# Patient Record
Sex: Female | Born: 1940
Health system: Southern US, Community
[De-identification: ages and names within clinical notes are randomized; demographics above are authoritative.]

## PROBLEM LIST (undated history)

## (undated) DIAGNOSIS — G918 Other hydrocephalus: Secondary | ICD-10-CM

## (undated) DIAGNOSIS — I509 Heart failure, unspecified: Secondary | ICD-10-CM

---

## 1998-08-27 ENCOUNTER — Encounter: Payer: Self-pay | Admitting: Obstetrics and Gynecology

## 1998-08-27 ENCOUNTER — Ambulatory Visit (HOSPITAL_COMMUNITY): Admission: RE | Admit: 1998-08-27 | Discharge: 1998-08-27 | Payer: Self-pay | Admitting: Obstetrics and Gynecology

## 1999-02-11 ENCOUNTER — Ambulatory Visit (HOSPITAL_COMMUNITY): Admission: RE | Admit: 1999-02-11 | Discharge: 1999-02-11 | Payer: Self-pay | Admitting: Obstetrics and Gynecology

## 1999-02-11 ENCOUNTER — Encounter: Payer: Self-pay | Admitting: Obstetrics and Gynecology

## 1999-03-03 ENCOUNTER — Ambulatory Visit (HOSPITAL_COMMUNITY): Admission: RE | Admit: 1999-03-03 | Discharge: 1999-03-03 | Payer: Self-pay | Admitting: Obstetrics and Gynecology

## 2007-06-21 ENCOUNTER — Encounter: Admission: RE | Admit: 2007-06-21 | Discharge: 2007-06-21 | Payer: Self-pay | Admitting: Obstetrics and Gynecology

## 2009-04-28 ENCOUNTER — Encounter: Admission: RE | Admit: 2009-04-28 | Discharge: 2009-04-28 | Payer: Self-pay | Admitting: Internal Medicine

## 2009-07-14 ENCOUNTER — Encounter: Admission: RE | Admit: 2009-07-14 | Discharge: 2009-07-14 | Payer: Self-pay | Admitting: Internal Medicine

## 2009-11-24 ENCOUNTER — Other Ambulatory Visit: Admission: RE | Admit: 2009-11-24 | Discharge: 2009-11-24 | Payer: Self-pay | Admitting: Internal Medicine

## 2009-11-24 ENCOUNTER — Encounter: Admission: RE | Admit: 2009-11-24 | Discharge: 2009-11-24 | Payer: Self-pay | Admitting: Internal Medicine

## 2010-01-13 ENCOUNTER — Encounter: Admission: RE | Admit: 2010-01-13 | Discharge: 2010-01-13 | Payer: Self-pay | Admitting: Internal Medicine

## 2010-04-29 ENCOUNTER — Encounter: Admission: RE | Admit: 2010-04-29 | Discharge: 2010-04-29 | Payer: Self-pay | Admitting: Internal Medicine

## 2011-06-20 ENCOUNTER — Other Ambulatory Visit: Payer: Self-pay | Admitting: Internal Medicine

## 2011-06-20 DIAGNOSIS — Z1231 Encounter for screening mammogram for malignant neoplasm of breast: Secondary | ICD-10-CM

## 2011-06-29 ENCOUNTER — Ambulatory Visit
Admission: RE | Admit: 2011-06-29 | Discharge: 2011-06-29 | Disposition: A | Discharge status: Home / Self Care | Payer: Medicare Other | Source: Ambulatory Visit | Attending: Internal Medicine | Admitting: Internal Medicine

## 2011-06-29 DIAGNOSIS — Z1231 Encounter for screening mammogram for malignant neoplasm of breast: Secondary | ICD-10-CM

## 2011-10-03 ENCOUNTER — Other Ambulatory Visit: Payer: Self-pay

## 2011-10-03 ENCOUNTER — Encounter (HOSPITAL_BASED_OUTPATIENT_CLINIC_OR_DEPARTMENT_OTHER): Payer: Self-pay | Admitting: *Deleted

## 2011-10-03 ENCOUNTER — Emergency Department (INDEPENDENT_AMBULATORY_CARE_PROVIDER_SITE_OTHER): Payer: Medicare Other

## 2011-10-03 ENCOUNTER — Emergency Department (HOSPITAL_BASED_OUTPATIENT_CLINIC_OR_DEPARTMENT_OTHER)
Admission: EM | Admit: 2011-10-03 | Discharge: 2011-10-03 | Disposition: A | Discharge status: Home / Self Care | Payer: Medicare Other | Attending: Emergency Medicine | Admitting: Emergency Medicine

## 2011-10-03 DIAGNOSIS — R42 Dizziness and giddiness: Secondary | ICD-10-CM | POA: Insufficient documentation

## 2011-10-03 DIAGNOSIS — R197 Diarrhea, unspecified: Secondary | ICD-10-CM

## 2011-10-03 DIAGNOSIS — R51 Headache: Secondary | ICD-10-CM

## 2011-10-03 DIAGNOSIS — R5381 Other malaise: Secondary | ICD-10-CM | POA: Insufficient documentation

## 2011-10-03 DIAGNOSIS — J069 Acute upper respiratory infection, unspecified: Secondary | ICD-10-CM | POA: Insufficient documentation

## 2011-10-03 DIAGNOSIS — R5383 Other fatigue: Secondary | ICD-10-CM | POA: Insufficient documentation

## 2011-10-03 DIAGNOSIS — H538 Other visual disturbances: Secondary | ICD-10-CM | POA: Insufficient documentation

## 2011-10-03 LAB — CBC
HCT: 39.7 % (ref 36.0–46.0)
Hemoglobin: 13.5 g/dL (ref 12.0–15.0)
MCH: 30.3 pg (ref 26.0–34.0)
MCHC: 34 g/dL (ref 30.0–36.0)
MCV: 89.2 fL (ref 78.0–100.0)
Platelets: 120 10*3/uL — ABNORMAL LOW (ref 150–400)
RBC: 4.45 MIL/uL (ref 3.87–5.11)
RDW: 13.1 % (ref 11.5–15.5)
WBC: 4.1 10*3/uL (ref 4.0–10.5)

## 2011-10-03 LAB — COMPREHENSIVE METABOLIC PANEL
ALT: 15 U/L (ref 0–35)
AST: 21 U/L (ref 0–37)
Albumin: 4.1 g/dL (ref 3.5–5.2)
Alkaline Phosphatase: 90 U/L (ref 39–117)
BUN: 14 mg/dL (ref 6–23)
CO2: 26 mEq/L (ref 19–32)
Calcium: 9.6 mg/dL (ref 8.4–10.5)
Chloride: 102 mEq/L (ref 96–112)
Creatinine, Ser: 0.8 mg/dL (ref 0.50–1.10)
GFR calc Af Amer: 86 mL/min — ABNORMAL LOW (ref >90)
GFR calc non Af Amer: 74 mL/min — ABNORMAL LOW (ref >90)
Glucose, Bld: 113 mg/dL — ABNORMAL HIGH (ref 70–99)
Potassium: 3.9 mEq/L (ref 3.5–5.1)
Sodium: 138 mEq/L (ref 135–145)
Total Bilirubin: 0.5 mg/dL (ref 0.3–1.2)
Total Protein: 7 g/dL (ref 6.0–8.3)

## 2011-10-03 LAB — URINALYSIS, ROUTINE W REFLEX MICROSCOPIC
Bilirubin Urine: NEGATIVE
Glucose, UA: NEGATIVE mg/dL
Hgb urine dipstick: NEGATIVE
Ketones, ur: NEGATIVE mg/dL
Leukocytes, UA: NEGATIVE
Nitrite: NEGATIVE
Protein, ur: NEGATIVE mg/dL
Specific Gravity, Urine: 1.006 (ref 1.005–1.030)
Urobilinogen, UA: 0.2 mg/dL (ref 0.0–1.0)
pH: 7 (ref 5.0–8.0)

## 2011-10-03 LAB — DIFFERENTIAL
Basophils Absolute: 0 10*3/uL (ref 0.0–0.1)
Basophils Relative: 1 % (ref 0–1)
Eosinophils Absolute: 0 10*3/uL (ref 0.0–0.7)
Eosinophils Relative: 0 % (ref 0–5)
Lymphocytes Relative: 14 % (ref 12–46)
Lymphs Abs: 0.6 10*3/uL — ABNORMAL LOW (ref 0.7–4.0)
Monocytes Absolute: 0.2 10*3/uL (ref 0.1–1.0)
Monocytes Relative: 5 % (ref 3–12)
Neutro Abs: 3.2 10*3/uL (ref 1.7–7.7)
Neutrophils Relative %: 80 % — ABNORMAL HIGH (ref 43–77)

## 2011-10-03 MED ORDER — METOCLOPRAMIDE HCL 5 MG/ML IJ SOLN
10.0000 mg | Freq: Once | INTRAMUSCULAR | Status: AC
  Administered 2011-10-03: 10 mg via INTRAVENOUS
  Filled 2011-10-03: qty 2

## 2011-10-03 MED ORDER — DEXAMETHASONE SODIUM PHOSPHATE 10 MG/ML IJ SOLN
10.0000 mg | Freq: Once | INTRAMUSCULAR | Status: AC
  Administered 2011-10-03: 10 mg via INTRAVENOUS
  Filled 2011-10-03: qty 1

## 2011-10-03 MED ORDER — KETOROLAC TROMETHAMINE 30 MG/ML IJ SOLN
30.0000 mg | Freq: Once | INTRAMUSCULAR | Status: AC
  Administered 2011-10-03: 30 mg via INTRAVENOUS
  Filled 2011-10-03: qty 1

## 2011-10-03 MED ORDER — ONDANSETRON HCL 4 MG PO TABS: 4.0000 mg | ORAL_TABLET | Freq: Four times a day (QID) | ORAL | Status: AC

## 2011-10-03 MED ORDER — SODIUM CHLORIDE 0.9 % IV BOLUS (SEPSIS)
1000.0000 mL | Freq: Once | INTRAVENOUS | Status: AC
  Administered 2011-10-03: 1000 mL via INTRAVENOUS

## 2011-10-03 MED ORDER — DIPHENHYDRAMINE HCL 50 MG/ML IJ SOLN
25.0000 mg | Freq: Once | INTRAMUSCULAR | Status: AC
  Administered 2011-10-03: 25 mg via INTRAVENOUS
  Filled 2011-10-03: qty 1

## 2011-10-03 NOTE — ED Provider Notes (Signed)
History     CSN: [id number]  Arrival date & time 10/03/11  0806   First MD Initiated Contact with Patient 10/03/11 [phone]      Chief Complaint  Patient presents with  . Nasal Congestion  . Headache  . Blurred Vision    (Consider location/radiation/quality/duration/timing/severity/associated sxs/prior treatment) HPI Comments: Has some blurred vision at the onset of her headache which has since resolved. Has some lightheadedness upon standing. His had 5-6 episodes of nonbloody diarrhea. States she recently lost her sister to a stroke and has been under a lot of stress. Concerned she has similar symptoms.  Patient is a 71 y.o. female presenting with headaches. The history is provided by the patient. No language interpreter was used.  Headache  This is a new problem. The current episode started yesterday. The problem occurs constantly. The problem has been gradually worsening (began acutely but has gradually worsened since onset). Associated with: began while at computer. Pain location: diffuse but started in neck and occiput. The quality of the pain is described as dull. The pain is moderate. The pain does not radiate. Associated symptoms include nausea. Pertinent negatives include no anorexia, no fever, no malaise/fatigue, no chest pressure, no near-syncope, no palpitations, no shortness of breath and no vomiting. She has tried nothing for the symptoms.    History reviewed. No pertinent past medical history.  History reviewed. No pertinent past surgical history.  History reviewed. No pertinent family history.  History  Substance Use Topics  . Smoking status: Never Smoker   . Smokeless tobacco: Not on file  . Alcohol Use: No    OB History    Grav Para Term Preterm Abortions TAB SAB Ect Mult Living                  Review of Systems  Constitutional: Positive for fatigue. Negative for fever, chills, malaise/fatigue, activity change and appetite change.  HENT: Positive for  congestion and rhinorrhea. Negative for sore throat, neck pain and neck stiffness.   Eyes: Positive for photophobia and visual disturbance. Negative for pain and discharge.  Respiratory: Negative for cough and shortness of breath.   Cardiovascular: Negative for chest pain, palpitations and near-syncope.  Gastrointestinal: Positive for nausea and diarrhea. Negative for vomiting, abdominal pain, constipation and anorexia.  Genitourinary: Negative for dysuria, urgency, frequency and flank pain.  Musculoskeletal: Negative for myalgias, back pain, arthralgias and gait problem.  Neurological: Positive for light-headedness and headaches. Negative for dizziness, weakness and numbness.  All other systems reviewed and are negative.    Allergies  Codeine  Home Medications   Current Outpatient Rx  Name Route Sig Dispense Refill  . CALCIUM CARBONATE-VITAMIN D 500-200 MG-UNIT PO TABS Oral Take 1 tablet by mouth daily.      BP 146/73  Pulse 97  Temp(Src) 97.8 F (36.6 C) (Oral)  Resp 20  Ht 5\' 3"  (1.6 m)  Wt 138 lb (62.596 kg)  BMI 24.45 kg/m2  SpO2 97%  Physical Exam  Nursing note and vitals reviewed. Constitutional: She is oriented to person, place, and time. She appears well-developed and well-nourished. No distress.  HENT:  Head: Normocephalic and atraumatic.  Mouth/Throat: Oropharynx is clear and moist.  Eyes: Conjunctivae and EOM are normal. Pupils are equal, round, and reactive to light.  Neck: Normal range of motion. Neck supple.  Cardiovascular: Normal rate, regular rhythm, normal heart sounds and intact distal pulses.  Exam reveals no gallop and no friction rub.   No murmur heard. Pulmonary/Chest: Effort  normal and breath sounds normal. No respiratory distress.  Abdominal: Soft. Bowel sounds are normal. There is no tenderness.  Musculoskeletal: Normal range of motion. She exhibits no tenderness.  Neurological: She is alert and oriented to person, place, and time. She has  normal strength and normal reflexes. No cranial nerve deficit or sensory deficit. Coordination and gait normal.  Skin: Skin is warm and dry. No rash noted.    ED Course  Procedures (including critical care time)   Date: 10/03/2011  Rate: 80  Rhythm: normal sinus rhythm  QRS Axis: normal  Intervals: normal  ST/T Wave abnormalities: normal  Conduction Disutrbances:none  Narrative Interpretation:   Old EKG Reviewed: none available  Labs Reviewed  CBC - Abnormal; Notable for the following:    Platelets 120 (*)    All other components within normal limits  DIFFERENTIAL - Abnormal; Notable for the following:    Neutrophils Relative 80 (*)    Lymphs Abs 0.6 (*)    All other components within normal limits  COMPREHENSIVE METABOLIC PANEL - Abnormal; Notable for the following:    Glucose, Bld 113 (*)    GFR calc non Af Amer 74 (*)    GFR calc Af Amer 86 (*)    All other components within normal limits  URINALYSIS, ROUTINE W REFLEX MICROSCOPIC   Ct Head Wo Contrast  10/03/2011  *RADIOLOGY REPORT*  Clinical Data: Headache and dizziness.  CT HEAD WITHOUT CONTRAST  Technique:  Contiguous axial images were obtained from the base of the skull through the vertex without contrast.  Comparison: CT head from 11/24/2009  Findings: The brain stem, cerebellum, cerebral peduncles, thalami, basal ganglia, basilar cisterns, and ventricular system appear unremarkable.  No intracranial hemorrhage, mass lesion, or acute infarction is identified.  The visualized paranasal sinuses appear clear.  IMPRESSION:  1.  No significant abnormality identified.  Original Report Authenticated By: Dellia Cloud, M.D.     1. URI (upper respiratory infection)   2. Headache   3. Diarrhea       MDM  CT is negative. I have no concern about malignant cause of headache such as subarachnoid hemorrhage or meningitis. I have no concern for a stroke. I feel her symptoms are secondary to increased stress and bereavement  due to her recent loss. She received IV fluids given the 5 episodes of diarrhea with some improvement of her symptoms. She is not orthostatic. She has no nausea. She'll be discharged home with instructions to followup with her primary care physician later this week        Dayton Bailiff, MD 10/03/11 1044

## 2011-10-03 NOTE — ED Notes (Signed)
Pt to room 5 in w/c reporting ha x yesterday, sudden onset, "pressure", states she was at the computer yesterday at work and noticed ha at 3pm and some blurry vision, pt states she also feels dizzy, and that she recently lost her sister to a cva. Dr. Brooke Dare at bedside for eval.

## 2011-10-03 NOTE — ED Notes (Signed)
Pt up and to RR.  Pt states "I still dont feel just right."

## 2011-10-05 ENCOUNTER — Encounter (HOSPITAL_BASED_OUTPATIENT_CLINIC_OR_DEPARTMENT_OTHER): Payer: Self-pay | Admitting: *Deleted

## 2011-10-05 ENCOUNTER — Emergency Department (HOSPITAL_BASED_OUTPATIENT_CLINIC_OR_DEPARTMENT_OTHER)
Admission: EM | Admit: 2011-10-05 | Discharge: 2011-10-06 | Disposition: A | Discharge status: Home / Self Care | Payer: Medicare Other | Attending: Emergency Medicine | Admitting: Emergency Medicine

## 2011-10-05 DIAGNOSIS — F411 Generalized anxiety disorder: Secondary | ICD-10-CM | POA: Insufficient documentation

## 2011-10-05 DIAGNOSIS — R51 Headache: Secondary | ICD-10-CM | POA: Insufficient documentation

## 2011-10-05 DIAGNOSIS — F419 Anxiety disorder, unspecified: Secondary | ICD-10-CM

## 2011-10-05 NOTE — ED Notes (Signed)
Pt c/o h/a and HTN, seen here 22nd for same , pt did not get zofran filled

## 2011-10-06 ENCOUNTER — Encounter (HOSPITAL_BASED_OUTPATIENT_CLINIC_OR_DEPARTMENT_OTHER): Payer: Self-pay | Admitting: Emergency Medicine

## 2011-10-06 MED ORDER — LORAZEPAM 2 MG/ML IJ SOLN
1.0000 mg | Freq: Once | INTRAMUSCULAR | Status: AC
  Administered 2011-10-06: 1 mg via INTRAVENOUS
  Filled 2011-10-06: qty 1

## 2011-10-06 MED ORDER — DIPHENHYDRAMINE HCL 50 MG/ML IJ SOLN
25.0000 mg | Freq: Once | INTRAMUSCULAR | Status: AC
  Administered 2011-10-06: 25 mg via INTRAVENOUS
  Filled 2011-10-06: qty 1

## 2011-10-06 MED ORDER — SODIUM CHLORIDE 0.9 % IV SOLN
INTRAVENOUS | Status: DC
  Administered 2011-10-06: 01:00:00 via INTRAVENOUS

## 2011-10-06 MED ORDER — METOCLOPRAMIDE HCL 5 MG/ML IJ SOLN
10.0000 mg | Freq: Once | INTRAMUSCULAR | Status: AC
  Administered 2011-10-06: 10 mg via INTRAVENOUS
  Filled 2011-10-06: qty 2
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
## 2011-10-09 DIAGNOSIS — G43909 Migraine, unspecified, not intractable, without status migrainosus: Secondary | ICD-10-CM

## 2011-10-09 HISTORY — DX: Migraine, unspecified, not intractable, without status migrainosus: G43.909

## 2012-07-24 ENCOUNTER — Other Ambulatory Visit: Payer: Self-pay | Admitting: Family Medicine

## 2012-07-24 DIAGNOSIS — Z1231 Encounter for screening mammogram for malignant neoplasm of breast: Secondary | ICD-10-CM

## 2012-09-13 ENCOUNTER — Ambulatory Visit
Admission: RE | Admit: 2012-09-13 | Discharge: 2012-09-13 | Disposition: A | Discharge status: Home / Self Care | Payer: Medicare Other | Source: Ambulatory Visit | Attending: Family Medicine | Admitting: Family Medicine

## 2012-09-13 DIAGNOSIS — Z1231 Encounter for screening mammogram for malignant neoplasm of breast: Secondary | ICD-10-CM

## 2013-07-22 ENCOUNTER — Other Ambulatory Visit (HOSPITAL_COMMUNITY): Payer: Self-pay | Admitting: Family Medicine

## 2013-07-22 DIAGNOSIS — R109 Unspecified abdominal pain: Secondary | ICD-10-CM

## 2013-08-01 ENCOUNTER — Ambulatory Visit (HOSPITAL_COMMUNITY): Payer: Medicare Other

## 2013-08-08 ENCOUNTER — Encounter (HOSPITAL_COMMUNITY)
Admission: RE | Admit: 2013-08-08 | Discharge: 2013-08-08 | Disposition: A | Discharge status: Home / Self Care | Payer: Medicare Other | Source: Ambulatory Visit | Attending: Family Medicine | Admitting: Family Medicine

## 2013-08-08 DIAGNOSIS — R109 Unspecified abdominal pain: Secondary | ICD-10-CM

## 2013-08-08 MED ORDER — TECHNETIUM TC 99M MEBROFENIN IV KIT
5.0000 | PACK | Freq: Once | INTRAVENOUS | Status: AC | PRN
  Administered 2013-08-08: 5 via INTRAVENOUS

## 2013-08-08 MED ORDER — SINCALIDE 5 MCG IJ SOLR
INTRAMUSCULAR | Status: AC
  Filled 2013-08-08: qty 5

## 2013-08-08 MED ORDER — SINCALIDE 5 MCG IJ SOLR
0.0200 ug/kg | Freq: Once | INTRAMUSCULAR | Status: AC
  Administered 2013-08-08: 1.26 ug via INTRAVENOUS

## 2013-08-08 MED ORDER — STERILE WATER FOR INJECTION IJ SOLN
INTRAMUSCULAR | Status: AC
  Filled 2013-08-08: qty 10

## 2013-10-02 ENCOUNTER — Other Ambulatory Visit: Payer: Self-pay

## 2013-10-02 DIAGNOSIS — Z1231 Encounter for screening mammogram for malignant neoplasm of breast: Secondary | ICD-10-CM

## 2013-10-30 ENCOUNTER — Ambulatory Visit: Payer: Medicare Other

## 2013-11-20 ENCOUNTER — Ambulatory Visit
Admission: RE | Admit: 2013-11-20 | Discharge: 2013-11-20 | Disposition: A | Discharge status: Home / Self Care | Payer: Medicare Other | Source: Ambulatory Visit

## 2013-11-20 DIAGNOSIS — Z1231 Encounter for screening mammogram for malignant neoplasm of breast: Secondary | ICD-10-CM

## 2014-04-16 DIAGNOSIS — M810 Age-related osteoporosis without current pathological fracture: Secondary | ICD-10-CM | POA: Insufficient documentation

## 2014-04-16 HISTORY — DX: Age-related osteoporosis without current pathological fracture: M81.0

## 2014-08-14 ENCOUNTER — Other Ambulatory Visit: Payer: Self-pay | Admitting: Dermatology

## 2014-12-02 ENCOUNTER — Other Ambulatory Visit: Payer: Self-pay

## 2014-12-02 DIAGNOSIS — Z1231 Encounter for screening mammogram for malignant neoplasm of breast: Secondary | ICD-10-CM

## 2014-12-29 ENCOUNTER — Ambulatory Visit
Admission: RE | Admit: 2014-12-29 | Discharge: 2014-12-29 | Disposition: A | Discharge status: Home / Self Care | Payer: Medicare Other | Source: Ambulatory Visit

## 2014-12-29 DIAGNOSIS — Z1231 Encounter for screening mammogram for malignant neoplasm of breast: Secondary | ICD-10-CM

## 2016-02-19 ENCOUNTER — Other Ambulatory Visit: Payer: Self-pay | Admitting: Family Medicine

## 2016-02-19 ENCOUNTER — Other Ambulatory Visit: Payer: Self-pay | Admitting: Nurse Practitioner

## 2016-02-19 DIAGNOSIS — Z1231 Encounter for screening mammogram for malignant neoplasm of breast: Secondary | ICD-10-CM

## 2016-02-19 DIAGNOSIS — E2839 Other primary ovarian failure: Secondary | ICD-10-CM

## 2016-03-21 ENCOUNTER — Ambulatory Visit
Admission: RE | Admit: 2016-03-21 | Discharge: 2016-03-21 | Disposition: A | Discharge status: Home / Self Care | Payer: Medicare Other | Source: Ambulatory Visit | Attending: Family Medicine | Admitting: Family Medicine

## 2016-03-21 DIAGNOSIS — Z1231 Encounter for screening mammogram for malignant neoplasm of breast: Secondary | ICD-10-CM

## 2016-08-01 ENCOUNTER — Other Ambulatory Visit: Payer: Self-pay | Admitting: Orthopaedic Surgery

## 2016-08-01 DIAGNOSIS — M4726 Other spondylosis with radiculopathy, lumbar region: Secondary | ICD-10-CM

## 2016-08-16 ENCOUNTER — Ambulatory Visit
Admission: RE | Admit: 2016-08-16 | Discharge: 2016-08-16 | Disposition: A | Discharge status: Home / Self Care | Payer: Medicare Other | Source: Ambulatory Visit | Attending: Orthopaedic Surgery | Admitting: Orthopaedic Surgery

## 2016-08-16 DIAGNOSIS — M4726 Other spondylosis with radiculopathy, lumbar region: Secondary | ICD-10-CM

## 2016-10-23 DIAGNOSIS — I951 Orthostatic hypotension: Secondary | ICD-10-CM

## 2016-10-23 HISTORY — DX: Orthostatic hypotension: I95.1

## 2017-03-19 ENCOUNTER — Other Ambulatory Visit: Payer: Self-pay | Admitting: Family Medicine

## 2017-03-19 DIAGNOSIS — Z1231 Encounter for screening mammogram for malignant neoplasm of breast: Secondary | ICD-10-CM

## 2017-04-02 ENCOUNTER — Ambulatory Visit
Admission: RE | Admit: 2017-04-02 | Discharge: 2017-04-02 | Disposition: A | Discharge status: Home / Self Care | Payer: Medicare Other | Source: Ambulatory Visit | Attending: Family Medicine | Admitting: Family Medicine

## 2017-04-02 DIAGNOSIS — Z1231 Encounter for screening mammogram for malignant neoplasm of breast: Secondary | ICD-10-CM

## 2017-07-11 DIAGNOSIS — M19041 Primary osteoarthritis, right hand: Secondary | ICD-10-CM | POA: Insufficient documentation

## 2017-07-11 HISTORY — DX: Primary osteoarthritis, right hand: M19.041

## 2018-02-18 DIAGNOSIS — M47816 Spondylosis without myelopathy or radiculopathy, lumbar region: Secondary | ICD-10-CM

## 2018-02-18 DIAGNOSIS — M51369 Other intervertebral disc degeneration, lumbar region without mention of lumbar back pain or lower extremity pain: Secondary | ICD-10-CM

## 2018-02-18 DIAGNOSIS — M5136 Other intervertebral disc degeneration, lumbar region: Secondary | ICD-10-CM

## 2018-02-18 HISTORY — DX: Other intervertebral disc degeneration, lumbar region: M51.36

## 2018-02-18 HISTORY — DX: Spondylosis without myelopathy or radiculopathy, lumbar region: M47.816

## 2018-02-18 HISTORY — DX: Other intervertebral disc degeneration, lumbar region without mention of lumbar back pain or lower extremity pain: M51.369

## 2018-03-13 ENCOUNTER — Other Ambulatory Visit: Payer: Self-pay | Admitting: Family Medicine

## 2018-03-13 DIAGNOSIS — Z1231 Encounter for screening mammogram for malignant neoplasm of breast: Secondary | ICD-10-CM

## 2018-04-09 ENCOUNTER — Ambulatory Visit
Admission: RE | Admit: 2018-04-09 | Discharge: 2018-04-09 | Disposition: A | Discharge status: Home / Self Care | Payer: Medicare Other | Source: Ambulatory Visit | Attending: Family Medicine | Admitting: Family Medicine

## 2018-04-09 DIAGNOSIS — Z1231 Encounter for screening mammogram for malignant neoplasm of breast: Secondary | ICD-10-CM

## 2019-02-28 ENCOUNTER — Other Ambulatory Visit: Payer: Self-pay | Admitting: Family Medicine

## 2019-02-28 DIAGNOSIS — Z1231 Encounter for screening mammogram for malignant neoplasm of breast: Secondary | ICD-10-CM

## 2019-04-14 ENCOUNTER — Ambulatory Visit
Admission: RE | Admit: 2019-04-14 | Discharge: 2019-04-14 | Disposition: A | Discharge status: Home / Self Care | Payer: Medicare Other | Source: Ambulatory Visit | Attending: Family Medicine | Admitting: Family Medicine

## 2019-04-14 ENCOUNTER — Other Ambulatory Visit: Payer: Self-pay

## 2019-04-14 DIAGNOSIS — Z1231 Encounter for screening mammogram for malignant neoplasm of breast: Secondary | ICD-10-CM

## 2019-04-16 ENCOUNTER — Other Ambulatory Visit: Payer: Self-pay | Admitting: Family Medicine

## 2019-04-16 DIAGNOSIS — R928 Other abnormal and inconclusive findings on diagnostic imaging of breast: Secondary | ICD-10-CM

## 2019-04-17 ENCOUNTER — Ambulatory Visit: Payer: Medicare Other

## 2019-04-17 ENCOUNTER — Ambulatory Visit
Admission: RE | Admit: 2019-04-17 | Discharge: 2019-04-17 | Disposition: A | Discharge status: Home / Self Care | Payer: Medicare Other | Source: Ambulatory Visit | Attending: Family Medicine | Admitting: Family Medicine

## 2019-04-17 ENCOUNTER — Other Ambulatory Visit: Payer: Self-pay

## 2019-04-17 DIAGNOSIS — R928 Other abnormal and inconclusive findings on diagnostic imaging of breast: Secondary | ICD-10-CM

## 2019-10-09 ENCOUNTER — Ambulatory Visit: Payer: Medicare Other

## 2019-10-16 ENCOUNTER — Ambulatory Visit: Payer: Medicare Other | Attending: Internal Medicine

## 2019-10-16 DIAGNOSIS — Z23 Encounter for immunization: Secondary | ICD-10-CM | POA: Insufficient documentation

## 2019-10-16 NOTE — Progress Notes (Signed)
   Covid-19 Vaccination Clinic  Name:  Kristin Rush    MRN: [id number] DOB: [birth year]  10/16/2019  [name] was observed post Covid-19 immunization for 15 minutes without incidence. She was provided with Vaccine Information Sheet and instruction to access the V-Safe system.   [name] was instructed to call 911 with any severe reactions post vaccine: Marland Kitchen Difficulty breathing  . Swelling of your face and throat  . A fast heartbeat  . A bad rash all over your body  . Dizziness and weakness    Immunizations Administered    Name Date Dose VIS Date Route   Pfizer COVID-19 Vaccine 10/16/2019 11:07 AM 0.3 mL 08/22/2019 Intramuscular   Manufacturer: [employer], [employer]   Lot: [id number]   NDC: [address]-2

## 2019-11-10 ENCOUNTER — Ambulatory Visit: Payer: Medicare Other | Attending: Internal Medicine

## 2019-11-10 DIAGNOSIS — Z23 Encounter for immunization: Secondary | ICD-10-CM

## 2019-11-10 NOTE — Progress Notes (Signed)
   Covid-19 Vaccination Clinic  Name:  Kristin Rush    MRN: [id number] DOB: Dec 29, 1940  11/10/2019  Kristin Rush was observed post Covid-19 immunization for 15 minutes without incidence. She was provided with Vaccine Information Sheet and instruction to access the V-Safe system.   Kristin Rush was instructed to call 911 with any severe reactions post vaccine: Marland Kitchen Difficulty breathing  . Swelling of your face and throat  . A fast heartbeat  . A bad rash all over your body  . Dizziness and weakness    Immunizations Administered    Name Date Dose VIS Date Route   Pfizer COVID-19 Vaccine 11/10/2019  2:35 PM 0.3 mL 08/22/2019 Intramuscular   Manufacturer: [employer], [employer]   Lot: [id number]   NDC: [address]-4

## 2020-02-25 ENCOUNTER — Ambulatory Visit: Payer: Medicare Other | Admitting: Family Medicine

## 2020-03-16 ENCOUNTER — Other Ambulatory Visit: Payer: Self-pay

## 2020-03-16 ENCOUNTER — Inpatient Hospital Stay (HOSPITAL_COMMUNITY)
Admission: EM | Admit: 2020-03-16 | Discharge: 2020-03-23 | DRG: 312 | Disposition: A | Discharge status: Skilled Nursing Facility | Payer: Medicare Other | Attending: Internal Medicine | Admitting: Internal Medicine

## 2020-03-16 ENCOUNTER — Emergency Department (HOSPITAL_COMMUNITY): Payer: Medicare Other

## 2020-03-16 DIAGNOSIS — M19042 Primary osteoarthritis, left hand: Secondary | ICD-10-CM | POA: Diagnosis present

## 2020-03-16 DIAGNOSIS — R55 Syncope and collapse: Secondary | ICD-10-CM | POA: Diagnosis present

## 2020-03-16 DIAGNOSIS — Z79899 Other long term (current) drug therapy: Secondary | ICD-10-CM

## 2020-03-16 DIAGNOSIS — H903 Sensorineural hearing loss, bilateral: Secondary | ICD-10-CM | POA: Diagnosis present

## 2020-03-16 DIAGNOSIS — I110 Hypertensive heart disease with heart failure: Secondary | ICD-10-CM | POA: Diagnosis present

## 2020-03-16 DIAGNOSIS — Z882 Allergy status to sulfonamides status: Secondary | ICD-10-CM

## 2020-03-16 DIAGNOSIS — M5136 Other intervertebral disc degeneration, lumbar region: Secondary | ICD-10-CM | POA: Diagnosis present

## 2020-03-16 DIAGNOSIS — I10 Essential (primary) hypertension: Secondary | ICD-10-CM | POA: Diagnosis present

## 2020-03-16 DIAGNOSIS — Z20822 Contact with and (suspected) exposure to covid-19: Secondary | ICD-10-CM | POA: Diagnosis present

## 2020-03-16 DIAGNOSIS — H9201 Otalgia, right ear: Secondary | ICD-10-CM | POA: Diagnosis present

## 2020-03-16 DIAGNOSIS — R531 Weakness: Secondary | ICD-10-CM

## 2020-03-16 DIAGNOSIS — R42 Dizziness and giddiness: Secondary | ICD-10-CM

## 2020-03-16 DIAGNOSIS — Z885 Allergy status to narcotic agent status: Secondary | ICD-10-CM

## 2020-03-16 DIAGNOSIS — I951 Orthostatic hypotension: Secondary | ICD-10-CM | POA: Diagnosis not present

## 2020-03-16 DIAGNOSIS — M47816 Spondylosis without myelopathy or radiculopathy, lumbar region: Secondary | ICD-10-CM | POA: Diagnosis present

## 2020-03-16 DIAGNOSIS — M19041 Primary osteoarthritis, right hand: Secondary | ICD-10-CM | POA: Diagnosis present

## 2020-03-16 DIAGNOSIS — M810 Age-related osteoporosis without current pathological fracture: Secondary | ICD-10-CM | POA: Diagnosis present

## 2020-03-16 DIAGNOSIS — I5031 Acute diastolic (congestive) heart failure: Secondary | ICD-10-CM | POA: Diagnosis present

## 2020-03-16 DIAGNOSIS — Z7982 Long term (current) use of aspirin: Secondary | ICD-10-CM

## 2020-03-16 DIAGNOSIS — H6123 Impacted cerumen, bilateral: Secondary | ICD-10-CM | POA: Diagnosis present

## 2020-03-16 DIAGNOSIS — R296 Repeated falls: Secondary | ICD-10-CM

## 2020-03-16 LAB — COMPREHENSIVE METABOLIC PANEL
ALT: 17 U/L (ref 0–44)
AST: 24 U/L (ref 15–41)
Albumin: 3.7 g/dL (ref 3.5–5.0)
Alkaline Phosphatase: 73 U/L (ref 38–126)
Anion gap: 12 (ref 5–15)
BUN: 17 mg/dL (ref 8–23)
CO2: 20 mmol/L — ABNORMAL LOW (ref 22–32)
Calcium: 9 mg/dL (ref 8.9–10.3)
Chloride: 105 mmol/L (ref 98–111)
Creatinine, Ser: 0.97 mg/dL (ref 0.44–1.00)
GFR calc Af Amer: >60 mL/min (ref >60)
GFR calc non Af Amer: 56 mL/min — ABNORMAL LOW (ref >60)
Glucose, Bld: 84 mg/dL (ref 70–99)
Potassium: 4.4 mmol/L (ref 3.5–5.1)
Sodium: 137 mmol/L (ref 135–145)
Total Bilirubin: 0.6 mg/dL (ref 0.3–1.2)
Total Protein: 6.1 g/dL — ABNORMAL LOW (ref 6.5–8.1)

## 2020-03-16 LAB — CBC WITH DIFFERENTIAL/PLATELET
Abs Immature Granulocytes: 0.01 10*3/uL (ref 0.00–0.07)
Basophils Absolute: 0 10*3/uL (ref 0.0–0.1)
Basophils Relative: 1 %
Eosinophils Absolute: 0.1 10*3/uL (ref 0.0–0.5)
Eosinophils Relative: 1 %
HCT: 36.3 % (ref 36.0–46.0)
Hemoglobin: 11.5 g/dL — ABNORMAL LOW (ref 12.0–15.0)
Immature Granulocytes: 0 %
Lymphocytes Relative: 23 %
Lymphs Abs: 1.1 10*3/uL (ref 0.7–4.0)
MCH: 30.2 pg (ref 26.0–34.0)
MCHC: 31.7 g/dL (ref 30.0–36.0)
MCV: 95.3 fL (ref 80.0–100.0)
Monocytes Absolute: 0.5 10*3/uL (ref 0.1–1.0)
Monocytes Relative: 9 %
Neutro Abs: 3.3 10*3/uL (ref 1.7–7.7)
Neutrophils Relative %: 66 %
Platelets: 123 10*3/uL — ABNORMAL LOW (ref 150–400)
RBC: 3.81 MIL/uL — ABNORMAL LOW (ref 3.87–5.11)
RDW: 12.6 % (ref 11.5–15.5)
WBC: 5 10*3/uL (ref 4.0–10.5)
nRBC: 0 % (ref 0.0–0.2)

## 2020-03-16 LAB — CBG MONITORING, ED: Glucose-Capillary: 85 mg/dL (ref 70–99)

## 2020-03-16 MED ORDER — SODIUM CHLORIDE 0.9 % IV BOLUS
500.0000 mL | Freq: Once | INTRAVENOUS | Status: AC
  Administered 2020-03-16: 500 mL via INTRAVENOUS

## 2020-03-16 NOTE — ED Provider Notes (Signed)
[facility] EMERGENCY DEPARTMENT Provider Note   CSN: [id number] Arrival date & time: 03/16/20  1954     History No chief complaint on file.   Kristin Rush is a 79 y.o. female.  Patient with history of hypertension currently on lisinopril, lumbar degenerative disc disease --presents the emergency department today with complaint of weakness.  Patient sister is at bedside who provides additional history.  For the past several years patient has had "spells" of leg weakness and dizziness, sometimes resulting in falling.  These have become more frequent recently, patient sister noted that she recently fell in the shower.  Earlier today the patient went to [facility] ED where she was evaluated for abdominal pain.  She had a CT scan which was reported to show enteritis.  Patient was discharged home.  (Currently outside records are unable to be viewed in Epic but patient has discharge paperwork showing that CT abdomen/pelvis and labs were ordered.)  Patient was transported home today by her husband.  When they got home she was unable to get out of the truck because her legs were weak.  When she did get out of the truck she fell to the ground beside the truck.  She needed several people to help her get up and into a golf cart.  She could not walk and therefore EMS was called and patient was transported to the emergency department.  Per EMS, she was orthostatic.  Patient denies decreased appetite, diarrhea or vomiting.  She does report left-sided abdominal pain that is mild.  Sister has reported instances of right-sided facial droop that is intermittent.  No chest pain or shortness of breath.  No headache, neck pain or confusion.  Patient otherwise denies signs of stroke including: slurred speech, aphasia, numbness in extremities.            Past Medical History:  Diagnosis Date  . Migraine     There are no problems to display for this patient.   No past surgical  history on file.   OB History   No obstetric history on file.     No family history on file.  Social History   Tobacco Use  . Smoking status: Never Smoker  Substance Use Topics  . Alcohol use: No  . Drug use: No    Home Medications Prior to Admission medications   Medication Sig Start Date End Date Taking? Authorizing Provider  acetaminophen (TYLENOL) 500 MG tablet Take 500 mg by mouth once as needed. For pain    [provider]  aspirin 81 MG tablet Take 160 mg by mouth once.    [provider]  calcium-vitamin D (OSCAL WITH D) 500-200 MG-UNIT per tablet Take 1 tablet by mouth daily.    [provider]  escitalopram (LEXAPRO) 10 MG tablet Take 10 mg by mouth daily.    [provider]  ibuprofen (ADVIL,MOTRIN) 200 MG tablet Take 600 mg by mouth once as needed. For pain    [provider]    Allergies    Codeine and Sulfa antibiotics  Review of Systems   Review of Systems  Constitutional: Negative for chills and fever.  HENT: Negative for rhinorrhea and sore throat.   Eyes: Negative for redness.  Respiratory: Negative for cough and shortness of breath.   Cardiovascular: Negative for chest pain.  Gastrointestinal: Positive for abdominal pain and nausea (Today only). Negative for diarrhea and vomiting.  Genitourinary: Negative for dysuria, frequency, hematuria and urgency.  Musculoskeletal: Negative for myalgias.  Skin: Negative for rash.  Neurological: Positive for facial asymmetry (Intermittent, not present currently) and weakness. Negative for headaches.    Physical Exam Updated Vital Signs BP 102/75 (BP Location: Right Arm)   Pulse 69   Temp 97.8 F (36.6 C) (Oral)   Resp 18   Ht 5\' 3"  (1.6 m)   Wt 61.2 kg   SpO2 97%   BMI 23.91 kg/m   Physical Exam Vitals and nursing note reviewed.  Constitutional:      Appearance: She is well-developed.  HENT:     Head: Normocephalic and atraumatic.     Right Ear: Tympanic  membrane, ear canal and external ear normal.     Left Ear: Tympanic membrane, ear canal and external ear normal.     Nose: Nose normal.     Mouth/Throat:     Pharynx: Uvula midline.  Eyes:     General: Lids are normal.     Extraocular Movements:     Right eye: No nystagmus.     Left eye: No nystagmus.     Conjunctiva/sclera: Conjunctivae normal.     Pupils: Pupils are equal, round, and reactive to light.  Cardiovascular:     Rate and Rhythm: Normal rate and regular rhythm.  Pulmonary:     Effort: Pulmonary effort is normal.     Breath sounds: Normal breath sounds.  Abdominal:     Palpations: Abdomen is soft.     Tenderness: There is abdominal tenderness (Mild, left upper quadrant, no rebound or guarding). There is no guarding or rebound.  Musculoskeletal:     Cervical back: Normal range of motion and neck supple. No tenderness or bony tenderness.     Right lower leg: No edema.     Left lower leg: No edema.  Skin:    General: Skin is warm and dry.  Neurological:     Mental Status: She is alert and oriented to person, place, and time.     GCS: GCS eye subscore is 4. GCS verbal subscore is 5. GCS motor subscore is 6.     Cranial Nerves: No cranial nerve deficit.     Sensory: No sensory deficit.     Coordination: Coordination normal.     Gait: Gait normal.     Deep Tendon Reflexes: Reflexes are normal and symmetric.     Comments: Patient with 5 out of 5 strength in the entirety of the upper and lower extremities.  Normal sensation distally.  Cranial nerves are grossly intact.  Psychiatric:        Mood and Affect: Mood normal.     ED Results / Procedures / Treatments   Labs (all labs ordered are listed, but only abnormal results are displayed) Labs Reviewed  CBC WITH DIFFERENTIAL/PLATELET - Abnormal; Notable for the following components:      Result Value   RBC 3.81 (*)    Hemoglobin 11.5 (*)    Platelets 123 (*)    All other components within normal limits  COMPREHENSIVE  METABOLIC PANEL - Abnormal; Notable for the following components:   CO2 20 (*)    Total Protein 6.1 (*)    GFR calc non Af Amer 56 (*)    All other components within normal limits  SARS CORONAVIRUS 2 BY RT PCR (HOSPITAL ORDER, PERFORMED IN [facility])  URINALYSIS, ROUTINE W REFLEX MICROSCOPIC  CBG MONITORING, ED    ED ECG REPORT   Date: 03/16/2020  Rate: 75  Rhythm: normal sinus rhythm  QRS Axis: normal  Intervals: normal  ST/T Wave abnormalities: normal  Conduction Disutrbances:none  Narrative Interpretation:   Old EKG Reviewed: unchanged  I have personally reviewed the EKG tracing and agree with the computerized printout as noted.   Radiology CT Head Wo Contrast  Result Date: 03/16/2020 CLINICAL DATA:  Ataxia, stroke suspected falls, bilateral leg weakness EXAM: CT HEAD WITHOUT CONTRAST TECHNIQUE: Contiguous axial images were obtained from the base of the skull through the vertex without intravenous contrast. COMPARISON:  Most recent head CT 10/03/2011 FINDINGS: Brain: No hemorrhage or evidence of acute ischemia. There is a remote lacunar infarct in the right basal ganglia. Mild generalized atrophy and chronic small vessel ischemia. Dilatation of the lateral ventricles is greater than the degree of sulcal prominence. No extra-axial or subdural collection. Punctate left basal ganglia calcification is likely senescent. Vascular: Atherosclerosis of skullbase vasculature without hyperdense vessel or abnormal calcification. Skull: No fracture. Small midline frontal skull osteoma. No suspicious lesion. Sinuses/Orbits: Paranasal sinuses and mastoid air cells are clear. The visualized orbits are unremarkable. Other: None. IMPRESSION: 1. No acute intracranial abnormality. 2. Dilatation of the lateral ventricles is greater than the degree of sulcal prominence, can be seen with normal pressure hydrocephalus in the appropriate clinical setting. 3. Mild chronic small vessel ischemia.  Remote lacunar infarct in the right basal ganglia. Electronically Signed   By: Narda Rutherford M.D.   On: 03/16/2020 21:55    Procedures Procedures (including critical care time)  Medications Ordered in ED Medications  0.9 %  sodium chloride infusion (has no administration in time range)  sodium chloride 0.9 % bolus 500 mL (500 mLs Intravenous New Bag/Given 03/16/20 2254)    ED Course  I have reviewed the triage vital signs and the nursing notes.  Pertinent labs & imaging results that were available during my care of the patient were reviewed by me and considered in my medical decision making (see chart for details).  Patient seen and examined. Work-up initiated. Orthostatics ordered. Unable to see notes from [facility] at this time.   Vital signs reviewed and are as follows: BP 102/75 (BP Location: Right Arm)   Pulse 69   Temp 97.8 F (36.6 C) (Oral)   Resp 18   Ht 5\' 3"  (1.6 m)   Wt 61.2 kg   SpO2 97%   BMI 23.91 kg/m   Orthostatic VS for the past 24 hrs:  BP- Lying Pulse- Lying BP- Sitting Pulse- Sitting BP- Standing at 0 minutes Pulse- Standing at 0 minutes  03/16/20 2158 147/54 72 101/60 82 (!) 69/49 89   11:23 PM patient resting comfortably in bed.  She is continuing to receive IV fluids.  Lab work-up and head CT are unrevealing.  Discussed with patient that we recommend admission given her significant orthostasis.  She is in agreement.  Will discuss with hospitalist.  12:25 AM Spoke with Dr. [id number] who will see.     MDM Rules/Calculators/A&P                          Admit.   Final Clinical Impression(s) / ED Diagnoses Final diagnoses:  Orthostatic hypotension  Generalized weakness    Rx / DC Orders ED Discharge Orders    None       Leafy Half, PA-C 03/17/20 05/18/20    [id number], MD 03/17/20 [phone]

## 2020-03-16 NOTE — ED Triage Notes (Signed)
Pt coming from home after pt was transported home after visiting [facility] ER (due to weakness), and pt passed out outside of house. Family stated that they saw pt had a facial droop, but family could not note which side. No facial droop or any other stroke symptoms noted by EMS. Pt was diagnosed with enteritis @ [facility] but said they did not address any of her other "gait or speech issues". Per pt and pt's family, pt has progressively developed abnormal gait and asphasia over the past few months. Unknown LKN. Was initially hypotensive upon EMS arrival to pt's home @ 90/40. Performed orthostatic vitals and pt decreased from 115/70 to 70/40 with EMS.

## 2020-03-16 NOTE — ED Notes (Signed)
When doing orhtostatics patient felt fine until going from sitting to standing. Blood pressure dropped to 69/49 and patient states she started to feel dizzy. RN notified.

## 2020-03-17 ENCOUNTER — Encounter (HOSPITAL_COMMUNITY): Payer: Self-pay | Admitting: Internal Medicine

## 2020-03-17 ENCOUNTER — Inpatient Hospital Stay (HOSPITAL_COMMUNITY): Payer: Medicare Other

## 2020-03-17 DIAGNOSIS — M5136 Other intervertebral disc degeneration, lumbar region: Secondary | ICD-10-CM | POA: Diagnosis present

## 2020-03-17 DIAGNOSIS — M810 Age-related osteoporosis without current pathological fracture: Secondary | ICD-10-CM | POA: Diagnosis present

## 2020-03-17 DIAGNOSIS — H6123 Impacted cerumen, bilateral: Secondary | ICD-10-CM | POA: Diagnosis present

## 2020-03-17 DIAGNOSIS — Z20822 Contact with and (suspected) exposure to covid-19: Secondary | ICD-10-CM | POA: Diagnosis present

## 2020-03-17 DIAGNOSIS — H9201 Otalgia, right ear: Secondary | ICD-10-CM | POA: Diagnosis present

## 2020-03-17 DIAGNOSIS — R296 Repeated falls: Secondary | ICD-10-CM | POA: Diagnosis present

## 2020-03-17 DIAGNOSIS — I10 Essential (primary) hypertension: Secondary | ICD-10-CM | POA: Diagnosis not present

## 2020-03-17 DIAGNOSIS — I5031 Acute diastolic (congestive) heart failure: Secondary | ICD-10-CM | POA: Diagnosis present

## 2020-03-17 DIAGNOSIS — M19042 Primary osteoarthritis, left hand: Secondary | ICD-10-CM | POA: Diagnosis present

## 2020-03-17 DIAGNOSIS — Z79899 Other long term (current) drug therapy: Secondary | ICD-10-CM | POA: Diagnosis not present

## 2020-03-17 DIAGNOSIS — R55 Syncope and collapse: Secondary | ICD-10-CM

## 2020-03-17 DIAGNOSIS — I951 Orthostatic hypotension: Secondary | ICD-10-CM | POA: Diagnosis present

## 2020-03-17 DIAGNOSIS — I110 Hypertensive heart disease with heart failure: Secondary | ICD-10-CM | POA: Diagnosis present

## 2020-03-17 DIAGNOSIS — M47816 Spondylosis without myelopathy or radiculopathy, lumbar region: Secondary | ICD-10-CM | POA: Diagnosis present

## 2020-03-17 DIAGNOSIS — R42 Dizziness and giddiness: Secondary | ICD-10-CM

## 2020-03-17 DIAGNOSIS — H903 Sensorineural hearing loss, bilateral: Secondary | ICD-10-CM | POA: Diagnosis present

## 2020-03-17 DIAGNOSIS — Z885 Allergy status to narcotic agent status: Secondary | ICD-10-CM | POA: Diagnosis not present

## 2020-03-17 DIAGNOSIS — Z7982 Long term (current) use of aspirin: Secondary | ICD-10-CM | POA: Diagnosis not present

## 2020-03-17 DIAGNOSIS — M19041 Primary osteoarthritis, right hand: Secondary | ICD-10-CM | POA: Diagnosis present

## 2020-03-17 DIAGNOSIS — Z882 Allergy status to sulfonamides status: Secondary | ICD-10-CM | POA: Diagnosis not present

## 2020-03-17 HISTORY — DX: Sensorineural hearing loss, bilateral: H90.3

## 2020-03-17 LAB — COMPREHENSIVE METABOLIC PANEL
ALT: 15 U/L (ref 0–44)
AST: 20 U/L (ref 15–41)
Albumin: 3.2 g/dL — ABNORMAL LOW (ref 3.5–5.0)
Alkaline Phosphatase: 60 U/L (ref 38–126)
Anion gap: 8 (ref 5–15)
BUN: 13 mg/dL (ref 8–23)
CO2: 24 mmol/L (ref 22–32)
Calcium: 8.4 mg/dL — ABNORMAL LOW (ref 8.9–10.3)
Chloride: 105 mmol/L (ref 98–111)
Creatinine, Ser: 1 mg/dL (ref 0.44–1.00)
GFR calc Af Amer: >60 mL/min (ref >60)
GFR calc non Af Amer: 54 mL/min — ABNORMAL LOW (ref >60)
Glucose, Bld: 89 mg/dL (ref 70–99)
Potassium: 4 mmol/L (ref 3.5–5.1)
Sodium: 137 mmol/L (ref 135–145)
Total Bilirubin: 0.8 mg/dL (ref 0.3–1.2)
Total Protein: 5.4 g/dL — ABNORMAL LOW (ref 6.5–8.1)

## 2020-03-17 LAB — URINALYSIS, ROUTINE W REFLEX MICROSCOPIC
Bacteria, UA: NONE SEEN
Bilirubin Urine: NEGATIVE
Glucose, UA: NEGATIVE mg/dL
Hgb urine dipstick: NEGATIVE
Ketones, ur: NEGATIVE mg/dL
Nitrite: NEGATIVE
Protein, ur: NEGATIVE mg/dL
Specific Gravity, Urine: 1.015 (ref 1.005–1.030)
pH: 6 (ref 5.0–8.0)

## 2020-03-17 LAB — CBC WITH DIFFERENTIAL/PLATELET
Abs Immature Granulocytes: 0.01 10*3/uL (ref 0.00–0.07)
Basophils Absolute: 0 10*3/uL (ref 0.0–0.1)
Basophils Relative: 1 %
Eosinophils Absolute: 0.1 10*3/uL (ref 0.0–0.5)
Eosinophils Relative: 2 %
HCT: 33.8 % — ABNORMAL LOW (ref 36.0–46.0)
Hemoglobin: 11.1 g/dL — ABNORMAL LOW (ref 12.0–15.0)
Immature Granulocytes: 0 %
Lymphocytes Relative: 25 %
Lymphs Abs: 1.1 10*3/uL (ref 0.7–4.0)
MCH: 31.2 pg (ref 26.0–34.0)
MCHC: 32.8 g/dL (ref 30.0–36.0)
MCV: 94.9 fL (ref 80.0–100.0)
Monocytes Absolute: 0.5 10*3/uL (ref 0.1–1.0)
Monocytes Relative: 11 %
Neutro Abs: 2.6 10*3/uL (ref 1.7–7.7)
Neutrophils Relative %: 61 %
Platelets: 113 10*3/uL — ABNORMAL LOW (ref 150–400)
RBC: 3.56 MIL/uL — ABNORMAL LOW (ref 3.87–5.11)
RDW: 12.7 % (ref 11.5–15.5)
WBC: 4.3 10*3/uL (ref 4.0–10.5)
nRBC: 0 % (ref 0.0–0.2)

## 2020-03-17 LAB — ECHOCARDIOGRAM COMPLETE
Height: 63 in
Weight: 2160 oz

## 2020-03-17 LAB — SARS CORONAVIRUS 2 BY RT PCR (HOSPITAL ORDER, PERFORMED IN ~~LOC~~ HOSPITAL LAB): SARS Coronavirus 2: NEGATIVE

## 2020-03-17 LAB — CORTISOL-AM, BLOOD: Cortisol - AM: 11.6 ug/dL (ref 6.7–22.6)

## 2020-03-17 LAB — TROPONIN I (HIGH SENSITIVITY): Troponin I (High Sensitivity): 21 ng/L — ABNORMAL HIGH (ref <18)

## 2020-03-17 LAB — TSH: TSH: 2.896 u[IU]/mL (ref 0.350–4.500)

## 2020-03-17 LAB — MAGNESIUM: Magnesium: 2.2 mg/dL (ref 1.7–2.4)

## 2020-03-17 MED ORDER — ONDANSETRON HCL 4 MG PO TABS
4.0000 mg | ORAL_TABLET | Freq: Four times a day (QID) | ORAL | Status: DC | PRN
  Administered 2020-03-19: 4 mg via ORAL
  Filled 2020-03-17: qty 1

## 2020-03-17 MED ORDER — POLYETHYLENE GLYCOL 3350 17 G PO PACK: 17.0000 g | PACK | Freq: Every day | ORAL | Status: DC | PRN

## 2020-03-17 MED ORDER — MIDODRINE HCL 5 MG PO TABS
5.0000 mg | ORAL_TABLET | Freq: Three times a day (TID) | ORAL | Status: DC
  Administered 2020-03-17 – 2020-03-18 (×4): 5 mg via ORAL
  Filled 2020-03-17 (×4): qty 1

## 2020-03-17 MED ORDER — SODIUM CHLORIDE 0.9 % IV SOLN: INTRAVENOUS | Status: DC

## 2020-03-17 MED ORDER — SODIUM CHLORIDE 0.9 % IV SOLN: INTRAVENOUS | Status: AC

## 2020-03-17 MED ORDER — CARBAMIDE PEROXIDE 6.5 % OT SOLN
5.0000 [drp] | Freq: Two times a day (BID) | OTIC | Status: AC
  Administered 2020-03-17 – 2020-03-20 (×7): 5 [drp] via OTIC
  Filled 2020-03-17 (×2): qty 15

## 2020-03-17 MED ORDER — ACETAMINOPHEN 650 MG RE SUPP: 650.0000 mg | Freq: Four times a day (QID) | RECTAL | Status: DC | PRN

## 2020-03-17 MED ORDER — ESCITALOPRAM OXALATE 10 MG PO TABS
10.0000 mg | ORAL_TABLET | Freq: Every day | ORAL | Status: DC
  Administered 2020-03-17 – 2020-03-23 (×7): 10 mg via ORAL
  Filled 2020-03-17 (×7): qty 1

## 2020-03-17 MED ORDER — ACETAMINOPHEN 325 MG PO TABS
650.0000 mg | ORAL_TABLET | Freq: Four times a day (QID) | ORAL | Status: DC | PRN
  Administered 2020-03-17 – 2020-03-21 (×5): 650 mg via ORAL
  Filled 2020-03-17 (×5): qty 2

## 2020-03-17 MED ORDER — ENOXAPARIN SODIUM 40 MG/0.4ML ~~LOC~~ SOLN
40.0000 mg | Freq: Every day | SUBCUTANEOUS | Status: DC
  Administered 2020-03-17 – 2020-03-23 (×7): 40 mg via SUBCUTANEOUS
  Filled 2020-03-17 (×7): qty 0.4

## 2020-03-17 MED ORDER — ONDANSETRON HCL 4 MG/2ML IJ SOLN: 4.0000 mg | Freq: Four times a day (QID) | INTRAMUSCULAR | Status: DC | PRN

## 2020-03-17 NOTE — Progress Notes (Signed)
PROGRESS NOTE    Kristin Rush  IOX:[id number] DOB: [birth year] DOA: 03/16/2020 PCP: Eartha Inch, MD     Brief Narrative:  79 year old WF PMHx HTN, orthostatic hypotension, chronic low back pain, lumbar spondylosis, disc degeneration lumbar, bilateral sensorineural hearing loss,  Presents to [facility] emergency department with complaints of postural lightheadedness and presyncope.  Patient is a somewhat poor historian and the majority the history is been obtained from combination of discussions with both patient and her sister who is at the bedside.  Patient explains that she has been suffering with intermittent bouts of postural lightheadedness for several years.  Approximately 1 month ago, patient was placed on lisinopril by her primary care provider for hypertension.  This was initially started at 5 mg daily.  Then, approximately 2 weeks ago patient's lisinopril was increased to 10 mg daily.  At this point, the patient began to experience a dramatic increase in frequency and severity of her episodes of lightheadedness.  This has resulted in frequent falls on a nearly daily basis.  Patient explains that nearly every time she attempts to rise from a seated position she experiences intense lightheadedness and feelings like she is going to blackout.  Symptoms seem to improve with sitting down.  Due to continued frequent bouts of intense lightheadedness and falls, patient presents to [facility] emergency department for evaluation.  Upon evaluation in the emergency department patient was found to have profound orthostatic hypotension with a blood pressure dropping from 147/54 all the way to 69/49.  Due to the emergency department provider is concerned as to the patient's safety in the home setting these dramatic drops in blood pressure, hospitalist group has been called to assess the patient for mission the hospital.   Subjective: A/O x4, negative nausea, negative  vomiting, negative abdominal pain, negative chest pain. States in the last 3 weeks has had increasing incidence of falling/syncope. Has had presyncope for 4 years has been worked up by Dr. Cyndia Bent PCP as well as a neurologist. Patient was recently started on lisinopril and HCTZ by PCP for HTN. Per sister they have a family history of strokes.   Assessment & Plan: Covid vaccination;   Principal Problem:   Postural dizziness with presyncope Active Problems:   Orthostatic hypotension   Frequent falls   Essential hypertension   Right ear pain   Excessive cerumen in both ear canals   Syncope and collapse -7/7 patient remains with orthostatic hypotension. -Holding lisinopril and HCTZ -7/7 midodrine 5 mg TID -A.m. cortisol pending   Orthostatic hypotension -see syncope and collapse  Right ear pain -Patient did not complain of ear pain today  Patient is complaining of a 1 day history of right ear pain  And attempted to evaluate the ears via otoscopic examination but unfortunately patient possesses a significant amount of cerumen  Debrox drops ordered, repeat otoscopic examination can be attempted later in the hospitalization or in the outpatient setting by the patient's primary care provider.  Even if the patient did indeed have a right ear infection this would not cause orthostatic hypotension or lightheadedness and I do not believe that this is related.  No antibiotics for now.    Excessive cerumen in both ear canals  See assessment and plan above  DVT prophylaxis:  Code Status: Full Family Communication: 7/7 family present at bedside for discussion of plan of care Status is: Inpatient    Dispo: The patient is from: Home  Anticipated d/c is to: Home vs SNF              Anticipated d/c date is: 7/9              Patient currently unstable      Consultants:    Procedures/Significant Events:    I have personally reviewed and interpreted all  radiology studies and my findings are as above.  VENTILATOR SETTINGS:    Cultures 7/6 SARS coronavirus negative  Antimicrobials:    Devices    LINES / TUBES:      Continuous Infusions: . sodium chloride 125 mL/hr at 03/17/20 0119     Objective: Vitals:   03/17/20 0400 03/17/20 0445 03/17/20 0710 03/17/20 0711  BP: (!) 127/58 (!) 151/60 137/69 103/60  Pulse: 63 69 83 78  Resp: 17 12 15 15   Temp:      TempSrc:      SpO2: 94% 94% 96% 98%  Weight:      Height:        Intake/Output Summary (Last 24 hours) at 03/17/2020 [id number] Last data filed at 03/17/2020 0440 Gross per 24 hour  Intake 700 ml  Output --  Net 700 ml   Filed Weights   03/16/20 2003  Weight: 61.2 kg    Examination:  General: A/O x4, No acute respiratory distress Eyes: negative scleral hemorrhage, negative anisocoria, negative icterus ENT: Negative Runny nose, negative gingival bleeding, Neck:  Negative scars, masses, torticollis, lymphadenopathy, JVD Lungs: Clear to auscultation bilaterally without wheezes or crackles Cardiovascular: Regular rate and rhythm without murmur gallop or rub normal S1 and S2 Abdomen: negative abdominal pain, nondistended, positive soft, bowel sounds, no rebound, no ascites, no appreciable mass Extremities: No significant cyanosis, clubbing, or edema bilateral lower extremities Skin: Negative rashes, lesions, ulcers Psychiatric:  Negative depression, negative anxiety, negative fatigue, negative mania  Central nervous system:  Cranial nerves II through XII intact, tongue/uvula midline, all extremities muscle strength 5/5, sensation intact throughout,  negative dysarthria, negative expressive aphasia, negative receptive aphasia.  .     Data Reviewed: Care during the described time interval was provided by me .  I have reviewed this patient's available data, including medical history, events of note, physical examination, and all test results as part of my  evaluation.  CBC: Recent Labs  Lab 03/16/20 2215 03/17/20 0444  WBC 5.0 4.3  NEUTROABS 3.3 2.6  HGB 11.5* 11.1*  HCT 36.3 33.8*  MCV 95.3 94.9  PLT 123* 113*   Basic Metabolic Panel: Recent Labs  Lab 03/16/20 2215 03/17/20 0444  NA 137 137  K 4.4 4.0  CL 105 105  CO2 20* 24  GLUCOSE 84 89  BUN 17 13  CREATININE 0.97 1.00  CALCIUM 9.0 8.4*  MG  --  2.2   GFR: Estimated Creatinine Clearance: 39 mL/min (by C-G formula based on SCr of 1 mg/dL). Liver Function Tests: Recent Labs  Lab 03/16/20 2215 03/17/20 0444  AST 24 20  ALT 17 15  ALKPHOS 73 60  BILITOT 0.6 0.8  PROT 6.1* 5.4*  ALBUMIN 3.7 3.2*   No results for input(s): LIPASE, AMYLASE in the last 168 hours. No results for input(s): AMMONIA in the last 168 hours. Coagulation Profile: No results for input(s): INR, PROTIME in the last 168 hours. Cardiac Enzymes: No results for input(s): CKTOTAL, CKMB, CKMBINDEX, TROPONINI in the last 168 hours. BNP (last 3 results) No results for input(s): PROBNP in the last 8760 hours. HbA1C: No results for input(s):  HGBA1C in the last 72 hours. CBG: Recent Labs  Lab 03/16/20 2008  GLUCAP 85   Lipid Profile: No results for input(s): CHOL, HDL, LDLCALC, TRIG, CHOLHDL, LDLDIRECT in the last 72 hours. Thyroid Function Tests: Recent Labs    03/17/20 0444  TSH 2.896   Anemia Panel: No results for input(s): VITAMINB12, FOLATE, FERRITIN, TIBC, IRON, RETICCTPCT in the last 72 hours. Sepsis Labs: No results for input(s): PROCALCITON, LATICACIDVEN in the last 168 hours.  Recent Results (from the past 240 hour(s))  SARS Coronavirus 2 by RT PCR (hospital order, performed in [facility] hospital lab) Nasopharyngeal Nasopharyngeal Swab     Status: None   Collection Time: 03/16/20 11:41 PM   Specimen: Nasopharyngeal Swab  Result Value Ref Range Status   SARS Coronavirus 2 NEGATIVE NEGATIVE Final    Comment: (NOTE) SARS-CoV-2 target nucleic acids are NOT DETECTED.  The  SARS-CoV-2 RNA is generally detectable in upper and lower respiratory specimens during the acute phase of infection. The lowest concentration of SARS-CoV-2 viral copies this assay can detect is 250 copies / mL. A negative result does not preclude SARS-CoV-2 infection and should not be used as the sole basis for treatment or other patient management decisions.  A negative result may occur with improper specimen collection / handling, submission of specimen other than nasopharyngeal swab, presence of viral mutation(s) within the areas targeted by this assay, and inadequate number of viral copies (<250 copies / mL). A negative result must be combined with clinical observations, patient history, and epidemiological information.  Fact Sheet for Patients:   BoilerBrush.com.cy  Fact Sheet for Healthcare Providers: https://pope.com/  This test is not yet approved or  cleared by the [place] FDA and has been authorized for detection and/or diagnosis of SARS-CoV-2 by FDA under an Emergency Use Authorization (EUA).  This EUA will remain in effect (meaning this test can be used) for the duration of the COVID-19 declaration under Section 564(b)(1) of the Act, 21 U.S.C. section 360bbb-3(b)(1), unless the authorization is terminated or revoked sooner.  Performed at [facility], [address], [place], [place] [address]          Radiology Studies: CT Head Wo Contrast  Result Date: 03/16/2020 CLINICAL DATA:  Ataxia, stroke suspected falls, bilateral leg weakness EXAM: CT HEAD WITHOUT CONTRAST TECHNIQUE: Contiguous axial images were obtained from the base of the skull through the vertex without intravenous contrast. COMPARISON:  Most recent head CT 10/03/2011 FINDINGS: Brain: No hemorrhage or evidence of acute ischemia. There is a remote lacunar infarct in the right basal ganglia. Mild generalized atrophy and chronic small vessel  ischemia. Dilatation of the lateral ventricles is greater than the degree of sulcal prominence. No extra-axial or subdural collection. Punctate left basal ganglia calcification is likely senescent. Vascular: Atherosclerosis of skullbase vasculature without hyperdense vessel or abnormal calcification. Skull: No fracture. Small midline frontal skull osteoma. No suspicious lesion. Sinuses/Orbits: Paranasal sinuses and mastoid air cells are clear. The visualized orbits are unremarkable. Other: None. IMPRESSION: 1. No acute intracranial abnormality. 2. Dilatation of the lateral ventricles is greater than the degree of sulcal prominence, can be seen with normal pressure hydrocephalus in the appropriate clinical setting. 3. Mild chronic small vessel ischemia. Remote lacunar infarct in the right basal ganglia. Electronically Signed   By: Narda Rutherford M.D.   On: 03/16/2020 21:55        Scheduled Meds: . carbamide peroxide  5 drop Both EARS BID  . enoxaparin (LOVENOX) injection  40 mg Subcutaneous Daily  . escitalopram  10 mg Oral Daily  . midodrine  5 mg Oral TID WC   Continuous Infusions: . sodium chloride 125 mL/hr at 03/17/20 0119     LOS: 0 days    Time spent:40 min    Aaryanna Hyden, Roselind Messier, MD [facility] Pager [phone]  If 7PM-7AM, please contact night-coverage [email or web address] Password [facility] 03/17/2020, 8:38 AM

## 2020-03-17 NOTE — ED Notes (Signed)
Pt given tylenol for back pain.

## 2020-03-17 NOTE — H&P (Signed)
History and Physical    Kristin RotaSue Rush Wiesen RUE:[id number] DOB: 08/16/1941 DOA: 03/16/2020  PCP: Eartha InchBadger, Michael C, MD  Patient coming from: Home   Chief Complaint: Presyncope, frequent falls   HPI:    79 year old female with past medical history of hypertension, orthostatic hypotension, chronic low back pain who presents to [facility] department with complaints of postural lightheadedness and presyncope.  Patient is a somewhat poor historian and the majority the history is been obtained from combination of discussions with both patient and her sister who is at the bedside.  Patient explains that she has been suffering with intermittent bouts of postural lightheadedness for several years.  Approximately 1 month ago, patient was placed on lisinopril by her primary care provider for hypertension.  This was initially started at 5 mg daily.  Then, approximately 2 weeks ago patient's lisinopril was increased to 10 mg daily.  At this point, the patient began to experience a dramatic increase in frequency and severity of her episodes of lightheadedness.  This has resulted in frequent falls on a nearly daily basis.  Patient explains that nearly every time she attempts to rise from a seated position she experiences intense lightheadedness and feelings like she is going to blackout.  Symptoms seem to improve with sitting down.  Due to continued frequent bouts of intense lightheadedness and falls, patient presents to [facility]  emergency department for evaluation.  Upon evaluation in the emergency department patient was found to have profound orthostatic hypotension with a blood pressure dropping from 147/54 all the way to 69/49.  Due to the emergency department provider is concerned as to the patient's safety in the home setting these dramatic drops in blood pressure, hospitalist group has been called to assess the patient for mission the hospital.   Review of Systems: A  10-system review of systems has been performed and all systems are negative with the exception of what is listed in the HPI.    Past Medical History:  Diagnosis Date  . 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 Lumbar spondylosis 02/18/2018  . Migraine   . Migraines 10/09/2011  . Orthostatic hypotension 10/23/2016  . Osteoporosis 04/16/2014  . Primary osteoarthritis of both hands 07/11/2017    No past surgical history on file.   reports that she has never smoked. She does not have any smokeless tobacco history on file. She reports that she does not drink alcohol and does not use drugs.  Allergies  Allergen Reactions  . Codeine Nausea And Vomiting  . Sulfa Antibiotics     No family history on file.   Prior to Admission medications   Medication Sig Start Date End Date Taking? Authorizing Provider  acetaminophen (TYLENOL) 500 MG tablet Take 500 mg by mouth daily as needed for mild pain.    Yes [provider]  calcium-vitamin D (OSCAL WITH D) 500-200 MG-UNIT per tablet Take 1 tablet by mouth daily.   Yes [provider]  cyanocobalamin 1000 MCG tablet Take 1,000 mcg by mouth daily. 04/19/18  Yes [provider]  escitalopram (LEXAPRO) 10 MG tablet Take 10 mg by mouth daily.   Yes [provider]  ibuprofen (ADVIL,MOTRIN) 200 MG tablet Take 600 mg by mouth daily as needed for moderate pain. For pain     Yes [provider]  lisinopril (ZESTRIL) 10 MG tablet Take 10 mg by mouth daily. 02/25/20  Yes [provider]  Multiple Vitamins-Minerals (PRESERVISION AREDS)  TABS Take 1 tablet by mouth daily.   Yes [provider]  dicyclomine (BENTYL) 20 MG tablet Take 20 mg by mouth 2 (two) times daily. 03/16/20   [provider]  ondansetron (ZOFRAN-ODT) 4 MG disintegrating tablet Take 4 mg by mouth every 8 (eight) hours as needed for nausea/vomiting. 03/16/20   [provider]     Physical Exam: Vitals:   03/16/20 2215 03/16/20 2230 03/17/20 0025 03/17/20 0100  BP: (!) 163/71 (!) 168/68  (!) 150/91  Pulse: 71 74 71 76  Resp: (!) 22 16 15 14   Temp:      TempSrc:      SpO2: 98% 98% 97% 97%  Weight:      Height:        Constitutional: Acute alert and oriented x3, no associated distress.   Skin: no rashes, no lesions, poor skin turgor noted. Eyes: Pupils are equally reactive to light.  No evidence of scleral icterus or conjunctival pallor.  ENMT: Significant cerumen noted in the bilateral ear canals limiting assessment of the bilateral tympanic membranes.  Moist mucous membranes noted.  Posterior pharynx clear of any exudate or lesions.   Neck: Notable tenderness of the right anterior cervical chain.  Normal, supple, no masses, no thyromegaly.  No evidence of jugular venous distension.   Respiratory: clear to auscultation bilaterally, no wheezing, no crackles. Normal respiratory effort. No accessory muscle use.  Cardiovascular: Regular rate and rhythm, no murmurs / rubs / gallops. No extremity edema. 2+ pedal pulses. No carotid bruits.  Chest:   Nontender without crepitus or deformity.   Back:   Nontender without crepitus or deformity. Abdomen: Abdomen is soft and nontender.  No evidence of intra-abdominal masses.  Positive bowel sounds noted in all quadrants.   Musculoskeletal: No joint deformity upper and lower extremities. Good ROM, no contractures. Normal muscle tone.  Neurologic: CN 2-12 grossly intact. Sensation intact, strength noted to be 5 out of 5 in all 4 extremities.  Patient is following all commands.  Patient is responsive to verbal stimuli.   Psychiatric: Patient presents as a normal mood with appropriate affect.  Patient seems to possess insight as to theircurrent situation.     Labs on Admission: I have personally reviewed following labs and imaging studies -   CBC: Recent Labs  Lab 03/16/20 2215  WBC 5.0  NEUTROABS 3.3  HGB 11.5*  HCT  36.3  MCV 95.3  PLT 123*   Basic Metabolic Panel: Recent Labs  Lab 03/16/20 2215  NA 137  K 4.4  CL 105  CO2 20*  GLUCOSE 84  BUN 17  CREATININE 0.97  CALCIUM 9.0   GFR: Estimated Creatinine Clearance: 40.2 mL/min (by C-G formula based on SCr of 0.97 mg/dL). Liver Function Tests: Recent Labs  Lab 03/16/20 2215  AST 24  ALT 17  ALKPHOS 73  BILITOT 0.6  PROT 6.1*  ALBUMIN 3.7   No results for input(s): LIPASE, AMYLASE in the last 168 hours. No results for input(s): AMMONIA in the last 168 hours. Coagulation Profile: No results for input(s): INR, PROTIME in the last 168 hours. Cardiac Enzymes: No results for input(s): CKTOTAL, CKMB, CKMBINDEX, TROPONINI in the last 168 hours. BNP (last 3 results) No results for input(s): PROBNP in the last 8760 hours. HbA1C: No results for input(s): HGBA1C in the last 72 hours. CBG: Recent Labs  Lab 03/16/20 2008  GLUCAP 85   Lipid Profile: No results for input(s): CHOL, HDL, LDLCALC, TRIG, CHOLHDL, LDLDIRECT in the  last 72 hours. Thyroid Function Tests: No results for input(s): TSH, T4TOTAL, FREET4, T3FREE, THYROIDAB in the last 72 hours. Anemia Panel: No results for input(s): VITAMINB12, FOLATE, FERRITIN, TIBC, IRON, RETICCTPCT in the last 72 hours. Urine analysis:    Component Value Date/Time   COLORURINE YELLOW 10/03/2011 1002   APPEARANCEUR CLEAR 10/03/2011 1002   LABSPEC 1.006 10/03/2011 1002   PHURINE 7.0 10/03/2011 1002   GLUCOSEU NEGATIVE 10/03/2011 1002   HGBUR NEGATIVE 10/03/2011 1002   BILIRUBINUR NEGATIVE 10/03/2011 1002   KETONESUR NEGATIVE 10/03/2011 1002   PROTEINUR NEGATIVE 10/03/2011 1002   UROBILINOGEN 0.2 10/03/2011 1002   NITRITE NEGATIVE 10/03/2011 1002   LEUKOCYTESUR NEGATIVE 10/03/2011 1002    Radiological Exams on Admission - Personally Reviewed: CT Head Wo Contrast  Result Date: 03/16/2020 CLINICAL DATA:  Ataxia, stroke suspected falls, bilateral leg weakness EXAM: CT HEAD WITHOUT CONTRAST  TECHNIQUE: Contiguous axial images were obtained from the base of the skull through the vertex without intravenous contrast. COMPARISON:  Most recent head CT 10/03/2011 FINDINGS: Brain: No hemorrhage or evidence of acute ischemia. There is a remote lacunar infarct in the right basal ganglia. Mild generalized atrophy and chronic small vessel ischemia. Dilatation of the lateral ventricles is greater than the degree of sulcal prominence. No extra-axial or subdural collection. Punctate left basal ganglia calcification is likely senescent. Vascular: Atherosclerosis of skullbase vasculature without hyperdense vessel or abnormal calcification. Skull: No fracture. Small midline frontal skull osteoma. No suspicious lesion. Sinuses/Orbits: Paranasal sinuses and mastoid air cells are clear. The visualized orbits are unremarkable. Other: None. IMPRESSION: 1. No acute intracranial abnormality. 2. Dilatation of the lateral ventricles is greater than the degree of sulcal prominence, can be seen with normal pressure hydrocephalus in the appropriate clinical setting. 3. Mild chronic small vessel ischemia. Remote lacunar infarct in the right basal ganglia. Electronically Signed   By: Narda Rutherford M.D.   On: 03/16/2020 21:55    EKG: Personally reviewed.  Rhythm is normal sinus rhythm with heart rate of 75 bpm.  No dynamic ST segment changes appreciated.  Assessment/Plan Principal Problem: Postural dizziness with presyncope     Patient presenting with profound orthostatic hypotension and sense of presyncope resulting in frequent falls.  Unfortunately, care everywhere is not working at this time but it seems the patient has been suffering from a longstanding history of orthostatic hypotension that may have been exacerbated by the recent initiation of lisinopril therapy.  Due to patient's frequent falls and dramatic drops in blood pressure, the emergency department provider is asking for overnight observation and  management   Will discontinue lisinopril, gently hydrate with intravenous isotonic fluids throughout the evening.  We will monitor patient closely on telemetry to ensure there is no evidence of underlying arrhythmia  Obtain cardiac enzymes  Will obtain echocardiogram in the morning  We will repeat orthostatic vital signs in the morning after overnight hydration.  If patient still exhibits substantial orthostatic hypotension, will place compression stockings and reassess.  Considering frequent falls, will obtain physical therapy evaluation  CT imaging of the head unremarkable anything acute.  There is some mention of possible normal pressure hydrocephalus however this clinically not consistent with the patient's presentation considering patient's profound orthostatic hypotension..  Active Problems:   Frequent falls  Fall precautions  PT eval  See assessment and plan above    Orthostatic hypotension   Please see assessment and plan above    Essential hypertension   Holding home regimen of lisinopril for now.  Considering patient's  advanced age, patient would likely benefit from a relaxed blood pressure target and may not actually need daily blood pressure therapy.    Right ear pain   Patient is complaining of a 1 day history of right ear pain  And attempted to evaluate the ears via otoscopic examination but unfortunately patient possesses a significant amount of cerumen  Debrox drops ordered, repeat otoscopic examination can be attempted later in the hospitalization or in the outpatient setting by the patient's primary care provider.  Even if the patient did indeed have a right ear infection this would not cause orthostatic hypotension or lightheadedness and I do not believe that this is related.  No antibiotics for now.    Excessive cerumen in both ear canals  See assessment and plan above    Code Status:  Full code Family Communication: Sister is at the  bedside and has been updated on plan of care  Status is: Observation  The patient remains OBS appropriate and will d/c before 2 midnights.  Dispo: The patient is from: Home              Anticipated d/c is to: Home              Anticipated d/c date is: 2 days              Patient currently is not medically stable to d/c.        Marinda Elk MD [facility] Pager [phone]  If 7PM-7AM, please contact night-coverage [email or web address] Use universal [place] password for that web site. If you do not have the password, please call the hospital operator.  03/17/2020, 1:19 AM

## 2020-03-17 NOTE — Progress Notes (Signed)
Physical Therapy Evaluation Patient Details Name: Kristin Rush MRN: [id number] DOB: 05-04-1941 Today's Date: 03/17/2020   History of Present Illness  79 yo female with onset of presyncope with standing and falls recently.  Has negative MRI but positive orthostatic testing in ED.  Did show dilatation of lateral ventricles, ischemia, remote stroke.  PMHx:  lacunar infarct in the right basal ganglia  Clinical Impression  Pt was seen for mobility in room, noted BP drops after gait and recommending rehab as a result.  Pt is adamant about going home, but family in to see how she is faring.  Follow acutely for these needs.  Pt is unsafe, but if family can cover 24/7 could reconsider home.    Follow Up Recommendations SNF;Supervision for mobility/OOB    Equipment Recommendations  None recommended by PT    Recommendations for Other Services       Precautions / Restrictions Precautions Precautions: Fall Precaution Comments: monitor vitals esp BP Restrictions Weight Bearing Restrictions: No      Mobility  Bed Mobility Overal bed mobility: Needs Assistance Bed Mobility: Supine to Sit;Sit to Supine     Supine to sit: Min assist Sit to supine: Min assist   General bed mobility comments: min assist to support trunk off bed  Transfers Overall transfer level: Needs assistance Equipment used: Rolling walker (2 wheeled);1 person hand held assist Transfers: Sit to/from Stand Sit to Stand: Min assist         General transfer comment: min assist to support power up to stand  Ambulation/Gait Ambulation/Gait assistance: Min guard Gait Distance (Feet): 50 Feet Assistive device: Rolling walker (2 wheeled);1 person hand held assist Gait Pattern/deviations: Step-through pattern;Decreased stride length;Wide base of support Gait velocity: monitored Gait velocity interpretation: <1.31 ft/sec, indicative of household ambulator General Gait Details: pt is getting up to side of bed with  help, BP taken through all steps.  Noted supine 147/62, sitting 140/64, standing 100/51, after walk was 85/62.    Stairs            Wheelchair Mobility    Modified Rankin (Stroke Patients Only)       Balance Overall balance assessment: Needs assistance Sitting-balance support: Bilateral upper extremity supported;Feet supported Sitting balance-Leahy Scale: Fair     Standing balance support: Bilateral upper extremity supported;During functional activity Standing balance-Leahy Scale: Poor Standing balance comment: requires support of walker                             Pertinent Vitals/Pain Pain Assessment: No/denies pain    Home Living Family/patient expects to be discharged to:: Private residence Living Arrangements: Alone;Other (Comment);Other relatives Available Help at Discharge: Family;Friend(s);Available PRN/intermittently Type of Home: House Home Access: Stairs to enter Entrance Stairs-Rails: Lawyer of Steps: 3 Home Layout: One level Home Equipment: Walker - 2 wheels;Walker - 4 wheels;Cane - single point;Grab bars - toilet;Tub bench      Prior Function Level of Independence: Independent               Hand Dominance   Dominant Hand: Right    Extremity/Trunk Assessment   Upper Extremity Assessment Upper Extremity Assessment: Overall WFL for tasks assessed    Lower Extremity Assessment Lower Extremity Assessment: Generalized weakness    Cervical / Trunk Assessment Cervical / Trunk Assessment: Kyphotic  Communication   Communication: No difficulties  Cognition Arousal/Alertness: Awake/alert Behavior During Therapy: WFL for tasks assessed/performed Overall Cognitive Status: Within Functional  Limits for tasks assessed                                        General Comments General comments (skin integrity, edema, etc.): pt is demonstrating better control of walker, but BP remains low     Exercises General Exercises - Lower Extremity Ankle Circles/Pumps: AAROM;5 reps Short Arc Quad: AROM;5 reps   Assessment/Plan    PT Assessment Patient needs continued PT services  PT Problem List Decreased strength;Decreased range of motion;Decreased activity tolerance;Decreased balance;Decreased mobility;Decreased coordination;Decreased cognition;Decreased knowledge of use of DME;Decreased safety awareness;Cardiopulmonary status limiting activity;Decreased skin integrity;Pain       PT Treatment Interventions DME instruction;Gait training;Stair training;Functional mobility training;Therapeutic activities;Therapeutic exercise;Balance training;Neuromuscular re-education;Cognitive remediation    PT Goals (Current goals can be found in the Care Plan section)  Acute Rehab PT Goals Patient Stated Goal: to go home PT Goal Formulation: With patient Time For Goal Achievement: 03/31/20 Potential to Achieve Goals: Good    Frequency Min 3X/week   Barriers to discharge Inaccessible home environment;Decreased caregiver support family works    Co-evaluation               AM-PAC PT "6 Clicks" Mobility  Outcome Measure Help needed turning from your back to your side while in a flat bed without using bedrails?: None Help needed moving from lying on your back to sitting on the side of a flat bed without using bedrails?: A Little Help needed moving to and from a bed to a chair (including a wheelchair)?: A Little Help needed standing up from a chair using your arms (e.g., wheelchair or bedside chair)?: A Little Help needed to walk in hospital room?: A Little Help needed climbing 3-5 steps with a railing? : A Lot 6 Click Score: 18    End of Session Equipment Utilized During Treatment: Gait belt Activity Tolerance: Patient tolerated treatment well;Patient limited by fatigue;Patient limited by lethargy;Patient limited by pain Patient left: in bed;with call bell/phone within reach;with bed  alarm set;with nursing/sitter in room Nurse Communication: Mobility status;Need for lift equipment;Patient requests pain meds;Precautions;Weight bearing status PT Visit Diagnosis: Unsteadiness on feet (R26.81);Muscle weakness (generalized) (M62.81);Difficulty in walking, not elsewhere classified (R26.2)    Time: 4431-5400 PT Time Calculation (min) (ACUTE ONLY): 30 min   Charges:   PT Evaluation $PT Eval Moderate Complexity: 1 Mod PT Treatments $Gait Training: 8-22 mins       Ivar Drape 03/17/2020, 11:43 PM  Samul Dada, PT MS Acute Rehab Dept. Number: [facility] [id number] and [facility] [phone]

## 2020-03-17 NOTE — ED Notes (Signed)
Sister, [name], requesting that RN leave voicemail for her once pt gets room number assigned so that she knows where to visit her sister in the morning

## 2020-03-17 NOTE — Progress Notes (Signed)
  Echocardiogram 2D Echocardiogram has been performed.  Kristin Rush 03/17/2020, 4:22 PM

## 2020-03-17 NOTE — ED Notes (Signed)
Urine culture sent down with u/a 

## 2020-03-17 NOTE — ED Notes (Signed)
Patient states that she did not feel dizzy or lightheaded like she did last night when doing orthostatics. Patient also states if she was walking she would feel like she is going to pass out.

## 2020-03-18 ENCOUNTER — Encounter (HOSPITAL_COMMUNITY): Payer: Self-pay | Admitting: Internal Medicine

## 2020-03-18 DIAGNOSIS — R531 Weakness: Secondary | ICD-10-CM

## 2020-03-18 DIAGNOSIS — R296 Repeated falls: Secondary | ICD-10-CM

## 2020-03-18 LAB — COMPREHENSIVE METABOLIC PANEL
ALT: 17 U/L (ref 0–44)
AST: 25 U/L (ref 15–41)
Albumin: 3.1 g/dL — ABNORMAL LOW (ref 3.5–5.0)
Alkaline Phosphatase: 59 U/L (ref 38–126)
Anion gap: 6 (ref 5–15)
BUN: 11 mg/dL (ref 8–23)
CO2: 25 mmol/L (ref 22–32)
Calcium: 8.2 mg/dL — ABNORMAL LOW (ref 8.9–10.3)
Chloride: 108 mmol/L (ref 98–111)
Creatinine, Ser: 0.89 mg/dL (ref 0.44–1.00)
GFR calc Af Amer: >60 mL/min (ref >60)
GFR calc non Af Amer: >60 mL/min (ref >60)
Glucose, Bld: 91 mg/dL (ref 70–99)
Potassium: 4.1 mmol/L (ref 3.5–5.1)
Sodium: 139 mmol/L (ref 135–145)
Total Bilirubin: 0.7 mg/dL (ref 0.3–1.2)
Total Protein: 5.2 g/dL — ABNORMAL LOW (ref 6.5–8.1)

## 2020-03-18 LAB — CBC WITH DIFFERENTIAL/PLATELET
Abs Immature Granulocytes: 0 10*3/uL (ref 0.00–0.07)
Basophils Absolute: 0 10*3/uL (ref 0.0–0.1)
Basophils Relative: 1 %
Eosinophils Absolute: 0.1 10*3/uL (ref 0.0–0.5)
Eosinophils Relative: 2 %
HCT: 32.8 % — ABNORMAL LOW (ref 36.0–46.0)
Hemoglobin: 10.6 g/dL — ABNORMAL LOW (ref 12.0–15.0)
Immature Granulocytes: 0 %
Lymphocytes Relative: 27 %
Lymphs Abs: 1.1 10*3/uL (ref 0.7–4.0)
MCH: 30.5 pg (ref 26.0–34.0)
MCHC: 32.3 g/dL (ref 30.0–36.0)
MCV: 94.3 fL (ref 80.0–100.0)
Monocytes Absolute: 0.4 10*3/uL (ref 0.1–1.0)
Monocytes Relative: 10 %
Neutro Abs: 2.5 10*3/uL (ref 1.7–7.7)
Neutrophils Relative %: 60 %
Platelets: 121 10*3/uL — ABNORMAL LOW (ref 150–400)
RBC: 3.48 MIL/uL — ABNORMAL LOW (ref 3.87–5.11)
RDW: 12.8 % (ref 11.5–15.5)
WBC: 4.1 10*3/uL (ref 4.0–10.5)
nRBC: 0 % (ref 0.0–0.2)

## 2020-03-18 LAB — TROPONIN I (HIGH SENSITIVITY): Troponin I (High Sensitivity): 12 ng/L (ref <18)

## 2020-03-18 LAB — MAGNESIUM: Magnesium: 2 mg/dL (ref 1.7–2.4)

## 2020-03-18 LAB — PHOSPHORUS: Phosphorus: 2.8 mg/dL (ref 2.5–4.6)

## 2020-03-18 MED ORDER — MIDODRINE HCL 5 MG PO TABS
10.0000 mg | ORAL_TABLET | Freq: Three times a day (TID) | ORAL | Status: DC
  Administered 2020-03-18 – 2020-03-19 (×3): 10 mg via ORAL
  Filled 2020-03-18 (×3): qty 2

## 2020-03-18 NOTE — Progress Notes (Signed)
PROGRESS NOTE    Kristin Rush  ZDG:[id number] DOB: 1940-12-26 DOA: 03/16/2020 PCP: Eartha InchBadger, Michael C, MD     Brief Narrative:  79 year old WF PMHx HTN, orthostatic hypotension, chronic low back pain, lumbar spondylosis, disc degeneration lumbar, bilateral sensorineural hearing loss,  [facility] with complaints of postural lightheadedness and presyncope.  Patient is a somewhat poor historian and the majority the history is been obtained from combination of discussions with both patient and her sister who is at the bedside.  Patient explains that she has been suffering with intermittent bouts of postural lightheadedness for several years.  Approximately 1 month ago, patient was placed on lisinopril by her primary care provider for hypertension.  This was initially started at 5 mg daily.  Then, approximately 2 weeks ago patient's lisinopril was increased to 10 mg daily.  At this point, the patient began to experience a dramatic increase in frequency and severity of her episodes of lightheadedness.  This has resulted in frequent falls on a nearly daily basis.  Patient explains that nearly every time she attempts to rise from a seated position she experiences intense lightheadedness and feelings like she is going to blackout.  Symptoms seem to improve with sitting down.  Due to continued frequent bouts of intense lightheadedness and falls, patient [facility] for [facility].  Upon evaluation in the emergency department patient was found to have profound orthostatic hypotension with a blood pressure dropping from 147/54 all the way to 69/49.  Due to the emergency department provider is concerned as to the patient's safety in the home setting these dramatic drops in blood pressure, hospitalist group has been called to assess the patient for mission the hospital.   Subjective: 7/8 A/O x4, negative CP, negative  nausea or vomiting.  Patient is going to talk with her daughters and consider SNF.    Assessment & Plan: Covid vaccination;   Principal Problem:   Postural dizziness with presyncope Active Problems:   Orthostatic hypotension   Frequent falls   Essential hypertension   Right ear pain   Excessive cerumen in both ear canals   Syncope and collapse   Syncope and collapse -7/7 patient remains with orthostatic hypotension. -Holding lisinopril and HCTZ -7/8 increase Midodrine 10 mg TID -7/8 TED hose -A.m. cortisol pending  Acute Diastolic CHF -See EKG results below -See syncope and collapse   Orthostatic hypotension -see syncope and collapse -7/8 patient continues to be orthostatic hypotension.  Right ear pain -Patient did not complain of ear pain today -In ED started on Debrox drops -Per EMR unable to evaluate further secondary to excessive cerumen -PCP to evaluate and treat  Excessive cerumen in both ear canals -See right ear  DVT prophylaxis:  Code Status: Full Family Communication: 7/8 daughter present at bedside for discussion of plan of care Status is: Inpatient    Dispo: The patient is from: Home              Anticipated d/c is to: Home vs SNF              Anticipated d/c date is: 7/14              Patient currently unstable      Consultants:    Procedures/Significant Events:  7/7 Eechocardiogram;Left Ventricle: LVEF= 60 to 65%.  -Grade II diastolic dysfunction (pseudonormalization).       I have personally reviewed and interpreted all radiology studies and my  findings are as above.  VENTILATOR SETTINGS:    Cultures 7/6 SARS coronavirus negative  Antimicrobials:    Devices    LINES / TUBES:      Continuous Infusions:    Objective: Vitals:   03/17/20 0922 03/17/20 1830 03/18/20 0024 03/18/20 0539  BP: (!) 182/76 (!) 189/70 (!) 151/93 137/69  Pulse: 78 70 70 70  Resp: Temp: 98.1 F (36.7 C) 98.9 F (37.2  C) 98.5 F (36.9 C) 98.3 F (36.8 C)  TempSrc: Oral Oral Oral Oral  SpO2:   95% 94%  Weight:      Height:        Intake/Output Summary (Last 24 hours) at 03/18/2020 0813 Last data filed at 03/17/2020 1500 Gross per 24 hour  Intake 400 ml  Output --  Net 400 ml   Filed Weights   03/16/20 2003  Weight: 61.2 kg    Examination:  General: A/O x4, No acute respiratory distress Eyes: negative scleral hemorrhage, negative anisocoria, negative icterus ENT: Negative Runny nose, negative gingival bleeding, Neck:  Negative scars, masses, torticollis, lymphadenopathy, JVD Lungs: Clear to auscultation bilaterally without wheezes or crackles Cardiovascular: Regular rate and rhythm without murmur gallop or rub normal S1 and S2 Abdomen: negative abdominal pain, nondistended, positive soft, bowel sounds, no rebound, no ascites, no appreciable mass Extremities: No significant cyanosis, clubbing, or edema bilateral lower extremities Skin: Negative rashes, lesions, ulcers Psychiatric:  Negative depression, negative anxiety, negative fatigue, negative mania  Central nervous system:  Cranial nerves II through XII intact, tongue/uvula midline, all extremities muscle strength 5/5, sensation intact throughout,  negative dysarthria, negative expressive aphasia, negative receptive aphasia.  .     Data Reviewed: Care during the described time interval was provided by me .  I have reviewed this patient's available data, including medical history, events of note, physical examination, and all test results as part of my evaluation.  CBC: Recent Labs  Lab 03/16/20 2215 03/17/20 0444 03/18/20 0252  WBC 5.0 4.3 4.1  NEUTROABS 3.3 2.6 2.5  HGB 11.5* 11.1* 10.6*  HCT 36.3 33.8* 32.8*  MCV 95.3 94.9 94.3  PLT 123* 113* 121*   Basic Metabolic Panel: Recent Labs  Lab 03/16/20 2215 03/17/20 0444 03/18/20 0252  NA 137 137 139  K 4.4 4.0 4.1  CL 105 105 108  CO2 20* 24 25  GLUCOSE 84 89 91  BUN CREATININE 0.97 1.00 0.89  CALCIUM 9.0 8.4* 8.2*  MG  --  2.2 2.0  PHOS  --   --  2.8   GFR: Estimated Creatinine Clearance: 43.8 mL/min (by C-G formula based on SCr of 0.89 mg/dL). Liver Function Tests: Recent Labs  Lab 03/16/20 2215 03/17/20 0444 03/18/20 0252  AST ALT ALKPHOS 73 60 59  BILITOT 0.6 0.8 0.7  PROT 6.1* 5.4* 5.2*  ALBUMIN 3.7 3.2* 3.1*   No results for input(s): LIPASE, AMYLASE in the last 168 hours. No results for input(s): AMMONIA in the last 168 hours. Coagulation Profile: No results for input(s): INR, PROTIME in the last 168 hours. Cardiac Enzymes: No results for input(s): CKTOTAL, CKMB, CKMBINDEX, TROPONINI in the last 168 hours. BNP (last 3 results) No results for input(s): PROBNP in the last 8760 hours. HbA1C: No results for input(s): HGBA1C in the last 72 hours. CBG: Recent Labs  Lab 03/16/20 2008  GLUCAP 85   Lipid Profile: No results for input(s): CHOL, HDL,  LDLCALC, TRIG, CHOLHDL, LDLDIRECT in the last 72 hours. Thyroid Function Tests: Recent Labs    03/17/20 0444  TSH 2.896   Anemia Panel: No results for input(s): VITAMINB12, FOLATE, FERRITIN, TIBC, IRON, RETICCTPCT in the last 72 hours. Sepsis Labs: No results for input(s): PROCALCITON, LATICACIDVEN in the last 168 hours.  Recent Results (from the past 240 hour(s))  SARS Coronavirus 2 by RT PCR (hospital order, performed in [facility] hospital lab) Nasopharyngeal Nasopharyngeal Swab     Status: None   Collection Time: 03/16/20 11:41 PM   Specimen: Nasopharyngeal Swab  Result Value Ref Range Status   SARS Coronavirus 2 NEGATIVE NEGATIVE Final    Comment: (NOTE) SARS-CoV-2 target nucleic acids are NOT DETECTED.  The SARS-CoV-2 RNA is generally detectable in upper and lower respiratory specimens during the acute phase of infection. The lowest concentration of SARS-CoV-2 viral copies this assay can detect is 250 copies / mL. A negative result does not  preclude SARS-CoV-2 infection and should not be used as the sole basis for treatment or other patient management decisions.  A negative result may occur with improper specimen collection / handling, submission of specimen other than nasopharyngeal swab, presence of viral mutation(s) within the areas targeted by this assay, and inadequate number of viral copies (<250 copies / mL). A negative result must be combined with clinical observations, patient history, and epidemiological information.  Fact Sheet for Patients:   BoilerBrush.com.cy  Fact Sheet for Healthcare Providers: https://pope.com/  This test is not yet approved or  cleared by the [place] FDA and has been authorized for detection and/or diagnosis of SARS-CoV-2 by FDA under an Emergency Use Authorization (EUA).  This EUA will remain in effect (meaning this test can be used) for the duration of the COVID-19 declaration under Section 564(b)(1) of the Act, 21 U.S.C. section 360bbb-3(b)(1), unless the authorization is terminated or revoked sooner.  Performed at [facility], [address] [place], [place] [address]          Radiology Studies: CT Head Wo Contrast  Result Date: 03/16/2020 CLINICAL DATA:  Ataxia, stroke suspected falls, bilateral leg weakness EXAM: CT HEAD WITHOUT CONTRAST TECHNIQUE: Contiguous axial images were obtained from the base of the skull through the vertex without intravenous contrast. COMPARISON:  Most recent head CT 10/03/2011 FINDINGS: Brain: No hemorrhage or evidence of acute ischemia. There is a remote lacunar infarct in the right basal ganglia. Mild generalized atrophy and chronic small vessel ischemia. Dilatation of the lateral ventricles is greater than the degree of sulcal prominence. No extra-axial or subdural collection. Punctate left basal ganglia calcification is likely senescent. Vascular: Atherosclerosis of skullbase vasculature  without hyperdense vessel or abnormal calcification. Skull: No fracture. Small midline frontal skull osteoma. No suspicious lesion. Sinuses/Orbits: Paranasal sinuses and mastoid air cells are clear. The visualized orbits are unremarkable. Other: None. IMPRESSION: 1. No acute intracranial abnormality. 2. Dilatation of the lateral ventricles is greater than the degree of sulcal prominence, can be seen with normal pressure hydrocephalus in the appropriate clinical setting. 3. Mild chronic small vessel ischemia. Remote lacunar infarct in the right basal ganglia. Electronically Signed   By: Narda Rutherford M.D.   On: 03/16/2020 21:55   ECHOCARDIOGRAM COMPLETE  Result Date: 03/17/2020    ECHOCARDIOGRAM REPORT   Patient Name:   Kristin Rush Date of Exam: 03/17/2020 Medical Rec #:  [id number]         Height:       63.0 in Accession #:  [id number]        Weight:       135.0 lb Date of Birth:  Nov 02, 1940         BSA:          1.636 m Patient Age:    89 years          BP:           182/76 mmHg Patient Gender: F                 HR:           67 bpm. Exam Location:  Inpatient Procedure: 2D Echo, Cardiac Doppler and Color Doppler Indications:    R55 Syncope  History:        Patient has no prior history of Echocardiogram examinations.                 Orthostatic Hypotension.  Sonographer:    Elmarie Shiley Dance Referring Phys: [id number] Deno Lunger [place] IMPRESSIONS  1. Left ventricular ejection fraction, by estimation, is 60 to 65%. The left ventricle has normal function. The left ventricle has no regional wall motion abnormalities. Left ventricular diastolic parameters are consistent with Grade II diastolic dysfunction (pseudonormalization). Elevated left ventricular end-diastolic pressure.  2. Right ventricular systolic function is normal. The right ventricular size is normal.  3. The mitral valve is normal in structure. Trivial mitral valve regurgitation. No evidence of mitral stenosis.  4. The aortic valve is normal in  structure. Aortic valve regurgitation is not visualized. No aortic stenosis is present.  5. The inferior vena cava is normal in size with greater than 50% respiratory variability, suggesting right atrial pressure of 3 mmHg. FINDINGS  Left Ventricle: Left ventricular ejection fraction, by estimation, is 60 to 65%. The left ventricle has normal function. The left ventricle has no regional wall motion abnormalities. The left ventricular internal cavity size was normal in size. There is  no left ventricular hypertrophy. Left ventricular diastolic parameters are consistent with Grade II diastolic dysfunction (pseudonormalization). Elevated left ventricular end-diastolic pressure. Right Ventricle: The right ventricular size is normal. No increase in right ventricular wall thickness. Right ventricular systolic function is normal. Left Atrium: Left atrial size was normal in size. Right Atrium: Right atrial size was normal in size. Pericardium: There is no evidence of pericardial effusion. Mitral Valve: The mitral valve is normal in structure. Normal mobility of the mitral valve leaflets. Trivial mitral valve regurgitation. No evidence of mitral valve stenosis. Tricuspid Valve: The tricuspid valve is normal in structure. Tricuspid valve regurgitation is trivial. No evidence of tricuspid stenosis. Aortic Valve: The aortic valve is normal in structure. Aortic valve regurgitation is not visualized. No aortic stenosis is present. Pulmonic Valve: The pulmonic valve was normal in structure. Pulmonic valve regurgitation is not visualized. No evidence of pulmonic stenosis. Aorta: The aortic root is normal in size and structure. Venous: The inferior vena cava is normal in size with greater than 50% respiratory variability, suggesting right atrial pressure of 3 mmHg. IAS/Shunts: No atrial level shunt detected by color flow Doppler.  LEFT VENTRICLE PLAX 2D LVIDd:         3.30 cm  Diastology LVIDs:         2.40 cm  LV e' lateral:   6.53  cm/s LV PW:         1.40 cm  LV E/e' lateral: 18.8 LV IVS:        0.80 cm  LV e' medial:  7.62 cm/s LVOT diam:     1.50 cm  LV E/e' medial:  16.1 LV SV:         42 LV SV Index:   26 LVOT Area:     1.77 cm  RIGHT VENTRICLE             IVC RV Basal diam:  2.20 cm     IVC diam: 1.80 cm RV S prime:     10.00 cm/s TAPSE (M-mode): 2.2 cm LEFT ATRIUM             Index       RIGHT ATRIUM           Index LA diam:        3.40 cm 2.08 cm/m  RA Area:     10.80 cm LA Vol (A2C):   54.7 ml 33.43 ml/m RA Volume:   22.80 ml  13.93 ml/m LA Vol (A4C):   38.9 ml 23.77 ml/m LA Biplane Vol: 46.8 ml 28.60 ml/m  AORTIC VALVE LVOT Vmax:   95.80 cm/s LVOT Vmean:  66.100 cm/s LVOT VTI:    0.239 m  AORTA Ao Root diam: 2.80 cm Ao Asc diam:  3.10 cm MITRAL VALVE MV Area (PHT): 4.21 cm     SHUNTS MV Decel Time: 180 msec     Systemic VTI:  0.24 m MV E velocity: 123.00 cm/s  Systemic Diam: 1.50 cm MV A velocity: 88.60 cm/s MV E/A ratio:  1.39 Charlton Haws MD Electronically signed by Charlton Haws MD Signature Date/Time: 03/17/2020/4:41:43 PM    Final         Scheduled Meds: . carbamide peroxide  5 drop Both EARS BID  . enoxaparin (LOVENOX) injection  40 mg Subcutaneous Daily  . escitalopram  10 mg Oral Daily  . midodrine  5 mg Oral TID WC   Continuous Infusions:    LOS: 1 day    Time spent:40 min    Rock Sobol, Roselind Messier, MD [facility] Pager [phone]  If 7PM-7AM, please contact night-coverage [email or web address] Password [email or web address] 03/18/2020, 8:13 AM

## 2020-03-18 NOTE — TOC Initial Note (Signed)
Transition of Care [facility]) - Initial/Assessment Note    Patient Details  Name: Kristin Rush MRN: [id number] Date of Birth: 08-Oct-1940  [facility]) CM/SW Contact:    Deatra Robinson, [place] Phone Number: 03/18/2020, 2:46 PM  Clinical Narrative:  Spoke with pt and pt's sister, Kristin Rush, re PT recommendation for SNF. Pt and family refusing SNF at this time and plan for pt to return home with 24/7 supervision by family along with [facility]. Pt lives with her sister Kristin Rush and her brother-in-law. Sister reports pt has 3 sisters and they all assist each other in addition to other family members. Discussed HH and they are agreeable, no preferred agency. [facility] and [facility] unable to accept referral. Will continue efforts. Messaged MD with update.   Dellie Burns, MSW, LCSW [phone] (coverage)                  Expected Discharge Plan: Home w Home Health Services Barriers to Discharge: Continued Medical Work up   Patient Goals and CMS Choice        Expected Discharge Plan and Services Expected Discharge Plan: Home w Home Health Services                                              Prior Living Arrangements/Services   Lives with:: Siblings Patient language and need for interpreter reviewed:: No Do you feel safe going back to the place where you live?: Yes      Need for Family Participation in Patient Care: Yes (Comment) Care giver support system in place?: Yes (comment) Current home services: DME    Activities of Daily Living Home Assistive Devices/Equipment: Dan Humphreys (specify type) ADL Screening (condition at time of admission) Patient's cognitive ability adequate to safely complete daily activities?: Yes Is the patient deaf or have difficulty hearing?: Yes Does the patient have difficulty seeing, even when wearing glasses/contacts?: No Does the patient have difficulty concentrating, remembering, or making decisions?: No Patient able to express need for  assistance with ADLs?: Yes Does the patient have difficulty dressing or bathing?: Yes Independently performs ADLs?: Yes (appropriate for developmental age) Does the patient have difficulty walking or climbing stairs?: No Weakness of Legs: Both Weakness of Arms/Hands: None  Permission Sought/Granted                  Emotional Assessment       Orientation: : Oriented to Place, Oriented to  Time, Oriented to Situation, Oriented to Self      Admission diagnosis:  Orthostatic hypotension [I95.1] Generalized weakness [R53.1] Postural dizziness with presyncope [R42, R55] Syncope and collapse [R55] Patient Active Problem List   Diagnosis Date Noted  . 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 Frequent falls 03/17/2020  . Postural dizziness with presyncope 03/17/2020  . Essential hypertension 03/17/2020  . Right ear pain 03/17/2020  . Excessive cerumen in both ear canals 03/17/2020  . Syncope and collapse 03/17/2020  . 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 Lumbar spondylosis 02/18/2018  . Primary osteoarthritis of both hands 07/11/2017  . Orthostatic hypotension 10/23/2016  . Osteoporosis 04/16/2014  . Migraines 10/09/2011   PCP:  Eartha Inch, MD Pharmacy:   [facility] [address], [place] - [address] [place] [address] Phone: [phone] Fax: [phone]  [facility] - [place], [place] -  [address] [place] [address] Phone: [phone] Fax: [phone]  [facility] [phone] - [place], [place] - [address] [place] [address] Phone: [phone] Fax: [phone]     Social Determinants of Health (SDOH) Interventions    Readmission Risk Interventions No flowsheet data found.

## 2020-03-19 DIAGNOSIS — H9201 Otalgia, right ear: Secondary | ICD-10-CM

## 2020-03-19 LAB — COMPREHENSIVE METABOLIC PANEL WITH GFR
ALT: 16 U/L (ref 0–44)
AST: 25 U/L (ref 15–41)
Albumin: 3.2 g/dL — ABNORMAL LOW (ref 3.5–5.0)
Alkaline Phosphatase: 61 U/L (ref 38–126)
Anion gap: 7 (ref 5–15)
BUN: 8 mg/dL (ref 8–23)
CO2: 25 mmol/L (ref 22–32)
Calcium: 8.7 mg/dL — ABNORMAL LOW (ref 8.9–10.3)
Chloride: 106 mmol/L (ref 98–111)
Creatinine, Ser: 0.84 mg/dL (ref 0.44–1.00)
GFR calc Af Amer: >60 mL/min
GFR calc non Af Amer: >60 mL/min
Glucose, Bld: 91 mg/dL (ref 70–99)
Potassium: 4 mmol/L (ref 3.5–5.1)
Sodium: 138 mmol/L (ref 135–145)
Total Bilirubin: 0.7 mg/dL (ref 0.3–1.2)
Total Protein: 5.7 g/dL — ABNORMAL LOW (ref 6.5–8.1)

## 2020-03-19 LAB — CBC WITH DIFFERENTIAL/PLATELET
Abs Immature Granulocytes: 0.01 K/uL (ref 0.00–0.07)
Basophils Absolute: 0 K/uL (ref 0.0–0.1)
Basophils Relative: 1 %
Eosinophils Absolute: 0.1 K/uL (ref 0.0–0.5)
Eosinophils Relative: 2 %
HCT: 33.2 % — ABNORMAL LOW (ref 36.0–46.0)
Hemoglobin: 11.1 g/dL — ABNORMAL LOW (ref 12.0–15.0)
Immature Granulocytes: 0 %
Lymphocytes Relative: 29 %
Lymphs Abs: 1.1 K/uL (ref 0.7–4.0)
MCH: 30.6 pg (ref 26.0–34.0)
MCHC: 33.4 g/dL (ref 30.0–36.0)
MCV: 91.5 fL (ref 80.0–100.0)
Monocytes Absolute: 0.4 K/uL (ref 0.1–1.0)
Monocytes Relative: 10 %
Neutro Abs: 2.3 K/uL (ref 1.7–7.7)
Neutrophils Relative %: 58 %
Platelets: 118 K/uL — ABNORMAL LOW (ref 150–400)
RBC: 3.63 MIL/uL — ABNORMAL LOW (ref 3.87–5.11)
RDW: 12.3 % (ref 11.5–15.5)
WBC: 4 K/uL (ref 4.0–10.5)
nRBC: 0 % (ref 0.0–0.2)

## 2020-03-19 LAB — PHOSPHORUS: Phosphorus: 3.2 mg/dL (ref 2.5–4.6)

## 2020-03-19 LAB — MAGNESIUM: Magnesium: 2.2 mg/dL (ref 1.7–2.4)

## 2020-03-19 MED ORDER — MIDODRINE HCL 5 MG PO TABS
7.5000 mg | ORAL_TABLET | Freq: Three times a day (TID) | ORAL | Status: DC
  Administered 2020-03-19 – 2020-03-21 (×7): 7.5 mg via ORAL
  Filled 2020-03-19 (×7): qty 2

## 2020-03-19 NOTE — Social Work (Signed)
Spoke with pt and pt's sister [name] who are both now reporting agreeable to SNF and requesting [facility]. Reviewed SNF placement process and answered questions. Submitted [employer] auth request and started SNF search. Will f/u with offers once available.   Dellie Burns, MSW, LCSW [phone] (coverage)

## 2020-03-19 NOTE — NC FL2 (Signed)
[place] MEDICAID FL2 LEVEL OF CARE SCREENING TOOL     IDENTIFICATION  Patient Name: Kristin Rush Birthdate: Dec 26, 1940 Sex: female Admission Date (Current Location): 03/16/2020  [facility] and [place] Number:  Producer, television/film/video and Address:  [facility] [place], [address], [place], [place] [address]      Provider Number: [id number]  Attending Physician Name and Address:  Drema Dallas, MD  Relative Name and Phone Number:       Current Level of Care: Hospital Recommended Level of Care: Skilled Nursing Facility Prior Approval Number:    Date Approved/Denied:   PASRR Number: [id number]  Discharge Plan:      Current Diagnoses: Patient Active Problem List   Diagnosis Date Noted  . 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 Frequent falls 03/17/2020  . Postural dizziness with presyncope 03/17/2020  . Essential hypertension 03/17/2020  . Right ear pain 03/17/2020  . Excessive cerumen in both ear canals 03/17/2020  . Syncope and collapse 03/17/2020  . 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 Lumbar spondylosis 02/18/2018  . Primary osteoarthritis of both hands 07/11/2017  . Orthostatic hypotension 10/23/2016  . Osteoporosis 04/16/2014  . Migraines 10/09/2011    Orientation RESPIRATION BLADDER Height & Weight     Time, Situation, Place, Self  Normal Continent Weight: 135 lb (61.2 kg) Height:  5\' 3"  (160 cm)  BEHAVIORAL SYMPTOMS/MOOD NEUROLOGICAL BOWEL NUTRITION STATUS      Continent    AMBULATORY STATUS COMMUNICATION OF NEEDS Skin   Limited Assist Verbally Normal                       Personal Care Assistance Level of Assistance  Bathing, Dressing Bathing Assistance: Limited assistance         Functional Limitations Info             SPECIAL CARE FACTORS FREQUENCY  PT (By licensed PT), OT (By licensed OT)                    Contractures Contractures Info: Not present    Additional Factors Info  Code  Status Code Status Info: FULL CODE             Current Medications (03/19/2020):  This is the current hospital active medication list Current Facility-Administered Medications  Medication Dose Route Frequency Provider Last Rate Last Admin  . acetaminophen (TYLENOL) tablet 650 mg  650 mg Oral Q6H PRN 05/20/2020, MD   650 mg at 03/18/20 1716   Or  . acetaminophen (TYLENOL) suppository 650 mg  650 mg Rectal Q6H PRN Shalhoub, 05/19/20, MD      . carbamide peroxide (DEBROX) 6.5 % OTIC (EAR) solution 5 drop  5 drop Both EARS BID Shalhoub, Deno Lunger, MD   5 drop at 03/19/20 0900  . enoxaparin (LOVENOX) injection 40 mg  40 mg Subcutaneous Daily Shalhoub, 05/20/20, MD   40 mg at 03/19/20 0859  . escitalopram (LEXAPRO) tablet 10 mg  10 mg Oral Daily Shalhoub, 05/20/20, MD   10 mg at 03/19/20 0900  . midodrine (PROAMATINE) tablet 7.5 mg  7.5 mg Oral TID WC 05/20/20, MD      . ondansetron [facility]) tablet 4 mg  4 mg Oral Q6H PRN Shalhoub, [facility], MD       Or  . ondansetron [facility]) injection 4 mg  4 mg Intravenous Q6H PRN Shalhoub, [facility], MD      .  polyethylene glycol (MIRALAX / GLYCOLAX) packet 17 g  17 g Oral Daily PRN Shalhoub, Deno Lunger, MD         Discharge Medications: Please see discharge summary for a list of discharge medications.  Relevant Imaging Results:  Relevant Lab Results:   Additional Information SS# [id number]  Deatra Robinson, [place]

## 2020-03-19 NOTE — Social Work (Signed)
CM/SW following for [facility] as pt/family refusing SNF. Still attempting efforts to locate an accepting [facility] agency.   [facility] unable to accept due to capacity. [facility] unable to accept due to service location -[facility] unable to accept due to staffing. -[facility] unable to accept any managed care insurance as of today until further notice.  -[facility] unable to accept due to capacity.   Will provide updates as available. EDD 7/14 per MD note.   Dellie Burns, MSW, LCSW [phone] (coverage)

## 2020-03-19 NOTE — Progress Notes (Signed)
PROGRESS NOTE    Kristin Rush  ERD:[id number] DOB: 07/25/1941 DOA: 03/16/2020 PCP: Kristin Inch, MD     Brief Narrative:  79 year old WF PMHx HTN, orthostatic hypotension, chronic low back pain, lumbar spondylosis, disc degeneration lumbar, bilateral sensorineural hearing loss,  Presents to [facility] emergency department with complaints of postural lightheadedness and presyncope.  Patient is a somewhat poor historian and the majority the history is been obtained from combination of discussions with both patient and her sister who is at the bedside.  Patient explains that she has been suffering with intermittent bouts of postural lightheadedness for several years.  Approximately 1 month ago, patient was placed on lisinopril by her primary care provider for hypertension.  This was initially started at 5 mg daily.  Then, approximately 2 weeks ago patient's lisinopril was increased to 10 mg daily.  At this point, the patient began to experience a dramatic increase in frequency and severity of her episodes of lightheadedness.  This has resulted in frequent falls on a nearly daily basis.  Patient explains that nearly every time she attempts to rise from a seated position she experiences intense lightheadedness and feelings like she is going to blackout.  Symptoms seem to improve with sitting down.  Due to continued frequent bouts of intense lightheadedness and falls, patient presents to [facility] emergency department for evaluation.  Upon evaluation in the emergency department patient was found to have profound orthostatic hypotension with a blood pressure dropping from 147/54 all the way to 69/49.  Due to the emergency department provider is concerned as to the patient's safety in the home setting these dramatic drops in blood pressure, hospitalist group has been called to assess the patient for mission the hospital.   Subjective: 7/9 A/O x4, negative CP, negative  nausea or vomiting.  Patient states she feels much better when she stands at the higher blood pressure.  Unfortunately appears patient's target SBP=160, as she drops~30 points when standing.  Assessment & Plan: Covid vaccination; positive vaccination   Principal Problem:   Postural dizziness with presyncope Active Problems:   Orthostatic hypotension   Frequent falls   Essential hypertension   Right ear pain   Excessive cerumen in both ear canals   Syncope and collapse   Syncope and collapse -7/7 patient remains with orthostatic hypotension. -Holding lisinopril and HCTZ -7/7 Am Cortisol WNL 11.6 -7/8 TED Hose -7/9 decrease Midodrine 7.5 mg TID; SBP goal 160 -Out of bed to chair q shift  Acute Diastolic CHF -See EKG results below -See syncope and collapse -Patient nor daughter knew that she had acute diastolic CHF.  Spoke with PA Vernona Rieger cardiology, upon discharge agreed to have Dr. Gala Romney Heart Failure team see patient in outpatient CHF clinic  Orthostatic hypotension -see syncope and collapse -7/9 orthostatic hypotension significantly improved. .  Right ear pain -Patient did not complain of ear pain today -In ED started on Debrox drops -Per EMR unable to evaluate further secondary to excessive cerumen -PCP to evaluate and treat  Excessive cerumen in both ear canals -See right ear  Plan of care -7/9 LCSW faxing paperwork over to SNF for authorization.  DVT prophylaxis:  Code Status: Full Family Communication: 7/9 daughter present at bedside for discussion of plan of care.  Patient and daughter verify that they have agreed would be in patient's best interest to spend some time at SNF Status is: Inpatient    Dispo: The patient is from: Home  Anticipated d/c is to: Home vs SNF              Anticipated d/c date is: 7/14              Patient currently unstable      Consultants:    Procedures/Significant Events:  7/7 Eechocardiogram;Left  Ventricle: LVEF= 60 to 65%.  -Grade II diastolic dysfunction (pseudonormalization).       I have personally reviewed and interpreted all radiology studies and my findings are as above.  VENTILATOR SETTINGS:    Cultures 7/6 SARS coronavirus negative  Antimicrobials:    Devices    LINES / TUBES:      Continuous Infusions:    Objective: Vitals:   03/18/20 1700 03/19/20 0013 03/19/20 0538 03/19/20 1138  BP: (!) 198/81 (!) 170/73 (!) 161/72 131/71  Pulse: 69 65 62 78  Resp: 17 16 16 18   Temp: (!) 97.5 F (36.4 C) 98.3 F (36.8 C) 98.3 F (36.8 C) (!) 97.5 F (36.4 C)  TempSrc: Oral Oral Oral Oral  SpO2: 96% 97% 95% 97%  Weight:      Height:        Intake/Output Summary (Last 24 hours) at 03/19/2020 1401 Last data filed at 03/18/2020 2200 Gross per 24 hour  Intake 240 ml  Output --  Net 240 ml   Filed Weights   03/16/20 2003  Weight: 61.2 kg   Physical Exam:  General: A/O x4, No acute respiratory distress Eyes: negative scleral hemorrhage, negative anisocoria, negative icterus ENT: Negative Runny nose, negative gingival bleeding, Neck:  Negative scars, masses, torticollis, lymphadenopathy, JVD Lungs: Clear to auscultation bilaterally without wheezes or crackles Cardiovascular: Regular rate and rhythm without murmur gallop or rub normal S1 and S2 Abdomen: negative abdominal pain, nondistended, positive soft, bowel sounds, no rebound, no ascites, no appreciable mass Extremities: No significant cyanosis, clubbing, or edema bilateral lower extremities Skin: Negative rashes, lesions, ulcers Psychiatric:  Negative depression, negative anxiety, negative fatigue, negative mania  Central nervous system:  Cranial nerves II through XII intact, tongue/uvula midline, all extremities muscle strength 5/5, sensation intact throughout,  negative dysarthria, negative expressive aphasia, negative receptive aphasia.   .     Data Reviewed: Care during the described time  interval was provided by me .  I have reviewed this patient's available data, including medical history, events of note, physical examination, and all test results as part of my evaluation.  CBC: Recent Labs  Lab 03/16/20 2215 03/17/20 0444 03/18/20 0252 03/19/20 0521  WBC 5.0 4.3 4.1 4.0  NEUTROABS 3.3 2.6 2.5 2.3  HGB 11.5* 11.1* 10.6* 11.1*  HCT 36.3 33.8* 32.8* 33.2*  MCV 95.3 94.9 94.3 91.5  PLT 123* 113* 121* 118*   Basic Metabolic Panel: Recent Labs  Lab 03/16/20 2215 03/17/20 0444 03/18/20 0252 03/19/20 0521  NA 137 137 139 138  K 4.4 4.0 4.1 4.0  CL 105 105 108 106  CO2 20* 24 25 25   GLUCOSE 84 89 91 91  BUN 17 13 11 8   CREATININE 0.97 1.00 0.89 0.84  CALCIUM 9.0 8.4* 8.2* 8.7*  MG  --  2.2 2.0 2.2  PHOS  --   --  2.8 3.2   GFR: Estimated Creatinine Clearance: 46.4 mL/min (by C-G formula based on SCr of 0.84 mg/dL). Liver Function Tests: Recent Labs  Lab 03/16/20 2215 03/17/20 0444 03/18/20 0252 03/19/20 0521  AST 24 20 25 25   ALT 17 15 17 16   ALKPHOS 73 60 59 61  BILITOT 0.6 0.8 0.7 0.7  PROT 6.1* 5.4* 5.2* 5.7*  ALBUMIN 3.7 3.2* 3.1* 3.2*   No results for input(s): LIPASE, AMYLASE in the last 168 hours. No results for input(s): AMMONIA in the last 168 hours. Coagulation Profile: No results for input(s): INR, PROTIME in the last 168 hours. Cardiac Enzymes: No results for input(s): CKTOTAL, CKMB, CKMBINDEX, TROPONINI in the last 168 hours. BNP (last 3 results) No results for input(s): PROBNP in the last 8760 hours. HbA1C: No results for input(s): HGBA1C in the last 72 hours. CBG: Recent Labs  Lab 03/16/20 2008  GLUCAP 85   Lipid Profile: No results for input(s): CHOL, HDL, LDLCALC, TRIG, CHOLHDL, LDLDIRECT in the last 72 hours. Thyroid Function Tests: Recent Labs    03/17/20 0444  TSH 2.896   Anemia Panel: No results for input(s): VITAMINB12, FOLATE, FERRITIN, TIBC, IRON, RETICCTPCT in the last 72 hours. Sepsis Labs: No results for  input(s): PROCALCITON, LATICACIDVEN in the last 168 hours.  Recent Results (from the past 240 hour(s))  SARS Coronavirus 2 by RT PCR (hospital order, performed in [facility] hospital lab) Nasopharyngeal Nasopharyngeal Swab     Status: None   Collection Time: 03/16/20 11:41 PM   Specimen: Nasopharyngeal Swab  Result Value Ref Range Status   SARS Coronavirus 2 NEGATIVE NEGATIVE Final    Comment: (NOTE) SARS-CoV-2 target nucleic acids are NOT DETECTED.  The SARS-CoV-2 RNA is generally detectable in upper and lower respiratory specimens during the acute phase of infection. The lowest concentration of SARS-CoV-2 viral copies this assay can detect is 250 copies / mL. A negative result does not preclude SARS-CoV-2 infection and should not be used as the sole basis for treatment or other patient management decisions.  A negative result may occur with improper specimen collection / handling, submission of specimen other than nasopharyngeal swab, presence of viral mutation(s) within the areas targeted by this assay, and inadequate number of viral copies (<250 copies / mL). A negative result must be combined with clinical observations, patient history, and epidemiological information.  Fact Sheet for Patients:   BoilerBrush.com.cy  Fact Sheet for Healthcare Providers: https://pope.com/  This test is not yet approved or  cleared by the [place] FDA and has been authorized for detection and/or diagnosis of SARS-CoV-2 by FDA under an Emergency Use Authorization (EUA).  This EUA will remain in effect (meaning this test can be used) for the duration of the COVID-19 declaration under Section 564(b)(1) of the Act, 21 U.S.C. section 360bbb-3(b)(1), unless the authorization is terminated or revoked sooner.  Performed at [facility], [address], [place], [place] [address]          Radiology Studies: ECHOCARDIOGRAM  COMPLETE  Result Date: 03/17/2020    ECHOCARDIOGRAM REPORT   Patient Name:   Kristin Rush Date of Exam: 03/17/2020 Medical Rec #:  [id number]         Height:       63.0 in Accession #:    [id number]        Weight:       135.0 lb Date of Birth:  21-Sep-1940         BSA:          1.636 m Patient Age:    79 years          BP:           182/76 mmHg Patient Gender: F  HR:           67 bpm. Exam Location:  Inpatient Procedure: 2D Echo, Cardiac Doppler and Color Doppler Indications:    R55 Syncope  History:        Patient has no prior history of Echocardiogram examinations.                 Orthostatic Hypotension.  Sonographer:    Elmarie Shiley Dance Referring Phys: [id number] Deno Lunger SHALHOUB IMPRESSIONS  1. Left ventricular ejection fraction, by estimation, is 60 to 65%. The left ventricle has normal function. The left ventricle has no regional wall motion abnormalities. Left ventricular diastolic parameters are consistent with Grade II diastolic dysfunction (pseudonormalization). Elevated left ventricular end-diastolic pressure.  2. Right ventricular systolic function is normal. The right ventricular size is normal.  3. The mitral valve is normal in structure. Trivial mitral valve regurgitation. No evidence of mitral stenosis.  4. The aortic valve is normal in structure. Aortic valve regurgitation is not visualized. No aortic stenosis is present.  5. The inferior vena cava is normal in size with greater than 50% respiratory variability, suggesting right atrial pressure of 3 mmHg. FINDINGS  Left Ventricle: Left ventricular ejection fraction, by estimation, is 60 to 65%. The left ventricle has normal function. The left ventricle has no regional wall motion abnormalities. The left ventricular internal cavity size was normal in size. There is  no left ventricular hypertrophy. Left ventricular diastolic parameters are consistent with Grade II diastolic dysfunction (pseudonormalization). Elevated left ventricular  end-diastolic pressure. Right Ventricle: The right ventricular size is normal. No increase in right ventricular wall thickness. Right ventricular systolic function is normal. Left Atrium: Left atrial size was normal in size. Right Atrium: Right atrial size was normal in size. Pericardium: There is no evidence of pericardial effusion. Mitral Valve: The mitral valve is normal in structure. Normal mobility of the mitral valve leaflets. Trivial mitral valve regurgitation. No evidence of mitral valve stenosis. Tricuspid Valve: The tricuspid valve is normal in structure. Tricuspid valve regurgitation is trivial. No evidence of tricuspid stenosis. Aortic Valve: The aortic valve is normal in structure. Aortic valve regurgitation is not visualized. No aortic stenosis is present. Pulmonic Valve: The pulmonic valve was normal in structure. Pulmonic valve regurgitation is not visualized. No evidence of pulmonic stenosis. Aorta: The aortic root is normal in size and structure. Venous: The inferior vena cava is normal in size with greater than 50% respiratory variability, suggesting right atrial pressure of 3 mmHg. IAS/Shunts: No atrial level shunt detected by color flow Doppler.  LEFT VENTRICLE PLAX 2D LVIDd:         3.30 cm  Diastology LVIDs:         2.40 cm  LV e' lateral:   6.53 cm/s LV PW:         1.40 cm  LV E/e' lateral: 18.8 LV IVS:        0.80 cm  LV e' medial:    7.62 cm/s LVOT diam:     1.50 cm  LV E/e' medial:  16.1 LV SV:         42 LV SV Index:   26 LVOT Area:     1.77 cm  RIGHT VENTRICLE             IVC RV Basal diam:  2.20 cm     IVC diam: 1.80 cm RV S prime:     10.00 cm/s TAPSE (M-mode): 2.2 cm LEFT ATRIUM  Index       RIGHT ATRIUM           Index LA diam:        3.40 cm 2.08 cm/m  RA Area:     10.80 cm LA Vol (A2C):   54.7 ml 33.43 ml/m RA Volume:   22.80 ml  13.93 ml/m LA Vol (A4C):   38.9 ml 23.77 ml/m LA Biplane Vol: 46.8 ml 28.60 ml/m  AORTIC VALVE LVOT Vmax:   95.80 cm/s LVOT Vmean:   66.100 cm/s LVOT VTI:    0.239 m  AORTA Ao Root diam: 2.80 cm Ao Asc diam:  3.10 cm MITRAL VALVE MV Area (PHT): 4.21 cm     SHUNTS MV Decel Time: 180 msec     Systemic VTI:  0.24 m MV E velocity: 123.00 cm/s  Systemic Diam: 1.50 cm MV A velocity: 88.60 cm/s MV E/A ratio:  1.39 Charlton Haws MD Electronically signed by Charlton Haws MD Signature Date/Time: 03/17/2020/4:41:43 PM    Final         Scheduled Meds: . carbamide peroxide  5 drop Both EARS BID  . enoxaparin (LOVENOX) injection  40 mg Subcutaneous Daily  . escitalopram  10 mg Oral Daily  . midodrine  10 mg Oral TID WC   Continuous Infusions:    LOS: 2 days    Time spent:40 min    Yamir Carignan, Roselind Messier, MD [facility] Pager [phone]  If 7PM-7AM, please contact night-coverage [email or web address] Password [email or web address] 03/19/2020, 2:01 PM

## 2020-03-19 NOTE — Progress Notes (Signed)
Physical Therapy Treatment Patient Details Name: Kristin Rush MRN: [id number] DOB: [birth year] Today's Date: 03/19/2020    History of Present Illness Pt is a 79 yo female with onset of presyncope with standing and falls recently.  Has negative MRI but positive orthostatic testing in ED.  Did show dilatation of lateral ventricles, ischemia, remote stroke.  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 However, she remains significantly limited overall due to BP dropping significantly with positional changes (see below for details). Pt's family present throughout. Pt and pt's family now agreeable to short-term rehab at a SNF prior to pt returning home. Pt would continue to benefit from skilled physical therapy services at this time while admitted and after d/c to address the below listed limitations in order to improve overall safety and independence with functional mobility.  BP supine = 203/78 BP sitting = 139/62 BP standing = 75/50 BP standing after 3 mins = 81/55 RN notified and aware.   Follow Up Recommendations  SNF;Supervision for mobility/OOB     Equipment Recommendations  None recommended by PT    Recommendations for Other Services       Precautions / Restrictions Precautions Precautions: Fall Precaution Comments: monitor vitals esp BP Restrictions Weight Bearing Restrictions: No    Mobility  Bed Mobility Overal bed mobility: Needs Assistance Bed Mobility: Supine to Sit     Supine to sit: Supervision     General bed mobility comments: HOB elevated, no physical assistance needed  Transfers Overall transfer level: Needs assistance Equipment used: 1 person hand held assist Transfers: Sit to/from Stand Sit to Stand: Min assist         General transfer comment: min A for stability and balance with transitional  movement  Ambulation/Gait Ambulation/Gait assistance: Min assist Gait Distance (Feet): 5 Feet Assistive device: 1 person hand held assist Gait Pattern/deviations: Step-through pattern;Decreased stride length Gait velocity: decreased   General Gait Details: pt ambulating from one side of the bed to the other. did not feel up for hallway ambulation today and BP was + orthostatic. Pt also reporting her legs feeling very weak   Stairs             Wheelchair Mobility    Modified Rankin (Stroke Patients Only)       Balance Overall balance assessment: Needs assistance Sitting-balance support: Feet supported Sitting balance-Leahy Scale: Fair     Standing balance support: Bilateral upper extremity supported;Single extremity supported Standing balance-Leahy Scale: Poor                              Cognition Arousal/Alertness: Awake/alert Behavior During Therapy: WFL for tasks assessed/performed Overall Cognitive Status: Within Functional Limits for tasks assessed                                        Exercises      General Comments        Pertinent Vitals/Pain Pain Assessment: No/denies pain    Home Living                      Prior Function            PT Goals (current goals can now be found in  the care plan section) Acute Rehab PT Goals PT Goal Formulation: With patient Time For Goal Achievement: 03/31/20 Potential to Achieve Goals: Good Progress towards PT goals: Progressing toward goals    Frequency    Min 3X/week      PT Plan Current plan remains appropriate    Co-evaluation              AM-PAC PT "6 Clicks" Mobility   Outcome Measure  Help needed turning from your back to your side while in a flat bed without using bedrails?: None Help needed moving from lying on your back to sitting on the side of a flat bed without using bedrails?: None Help needed moving to and from a bed to a chair  (including a wheelchair)?: A Little Help needed standing up from a chair using your arms (e.g., wheelchair or bedside chair)?: A Little Help needed to walk in hospital room?: A Little Help needed climbing 3-5 steps with a railing? : A Lot 6 Click Score: 19    End of Session   Activity Tolerance: Patient limited by fatigue Patient left: in bed;with call bell/phone within reach;with family/visitor present Nurse Communication: Mobility status PT Visit Diagnosis: Unsteadiness on feet (R26.81);Muscle weakness (generalized) (M62.81);Difficulty in walking, not elsewhere classified (R26.2)     Time: 3361-2244 PT Time Calculation (min) (ACUTE ONLY): 28 min  Charges:  $Therapeutic Activity: 23-37 mins                     Arletta Bale, DPT  Acute Rehabilitation Services Pager [phone] Office [phone]     Alessandra Bevels Nioma Mccubbins 03/19/2020, 1:12 PM

## 2020-03-20 DIAGNOSIS — I10 Essential (primary) hypertension: Secondary | ICD-10-CM

## 2020-03-20 LAB — CBC WITH DIFFERENTIAL/PLATELET
Abs Immature Granulocytes: 0.01 10*3/uL (ref 0.00–0.07)
Basophils Absolute: 0 10*3/uL (ref 0.0–0.1)
Basophils Relative: 1 %
Eosinophils Absolute: 0.1 10*3/uL (ref 0.0–0.5)
Eosinophils Relative: 2 %
HCT: 35.4 % — ABNORMAL LOW (ref 36.0–46.0)
Hemoglobin: 11.4 g/dL — ABNORMAL LOW (ref 12.0–15.0)
Immature Granulocytes: 0 %
Lymphocytes Relative: 30 %
Lymphs Abs: 1.2 10*3/uL (ref 0.7–4.0)
MCH: 30 pg (ref 26.0–34.0)
MCHC: 32.2 g/dL (ref 30.0–36.0)
MCV: 93.2 fL (ref 80.0–100.0)
Monocytes Absolute: 0.4 10*3/uL (ref 0.1–1.0)
Monocytes Relative: 11 %
Neutro Abs: 2.3 10*3/uL (ref 1.7–7.7)
Neutrophils Relative %: 56 %
Platelets: 115 10*3/uL — ABNORMAL LOW (ref 150–400)
RBC: 3.8 MIL/uL — ABNORMAL LOW (ref 3.87–5.11)
RDW: 12.4 % (ref 11.5–15.5)
WBC: 4 10*3/uL (ref 4.0–10.5)
nRBC: 0 % (ref 0.0–0.2)

## 2020-03-20 LAB — COMPREHENSIVE METABOLIC PANEL
ALT: 30 U/L (ref 0–44)
AST: 40 U/L (ref 15–41)
Albumin: 3.3 g/dL — ABNORMAL LOW (ref 3.5–5.0)
Alkaline Phosphatase: 65 U/L (ref 38–126)
Anion gap: 9 (ref 5–15)
BUN: 6 mg/dL — ABNORMAL LOW (ref 8–23)
CO2: 23 mmol/L (ref 22–32)
Calcium: 8.6 mg/dL — ABNORMAL LOW (ref 8.9–10.3)
Chloride: 105 mmol/L (ref 98–111)
Creatinine, Ser: 0.9 mg/dL (ref 0.44–1.00)
GFR calc Af Amer: >60 mL/min (ref >60)
GFR calc non Af Amer: >60 mL/min (ref >60)
Glucose, Bld: 96 mg/dL (ref 70–99)
Potassium: 4.1 mmol/L (ref 3.5–5.1)
Sodium: 137 mmol/L (ref 135–145)
Total Bilirubin: 0.4 mg/dL (ref 0.3–1.2)
Total Protein: 5.9 g/dL — ABNORMAL LOW (ref 6.5–8.1)

## 2020-03-20 LAB — PHOSPHORUS: Phosphorus: 3.3 mg/dL (ref 2.5–4.6)

## 2020-03-20 LAB — MAGNESIUM: Magnesium: 2.2 mg/dL (ref 1.7–2.4)

## 2020-03-20 NOTE — Progress Notes (Signed)
PROGRESS NOTE    Kristin Rush  IRJ:[id number] DOB: 1941/04/21 DOA: 03/16/2020 PCP: Eartha Inch, MD     Brief Narrative:  79 year old WF PMHx HTN, orthostatic hypotension, chronic low back pain, lumbar spondylosis, disc degeneration lumbar, bilateral sensorineural hearing loss,  Presents to [facility] emergency department with complaints of postural lightheadedness and presyncope.  Patient is a somewhat poor historian and the majority the history is been obtained from combination of discussions with both patient and her sister who is at the bedside.  Patient explains that she has been suffering with intermittent bouts of postural lightheadedness for several years.  Approximately 1 month ago, patient was placed on lisinopril by her primary care provider for hypertension.  This was initially started at 5 mg daily.  Then, approximately 2 weeks ago patient's lisinopril was increased to 10 mg daily.  At this point, the patient began to experience a dramatic increase in frequency and severity of her episodes of lightheadedness.  This has resulted in frequent falls on a nearly daily basis.  Patient explains that nearly every time she attempts to rise from a seated position she experiences intense lightheadedness and feelings like she is going to blackout.  Symptoms seem to improve with sitting down.  Due to continued frequent bouts of intense lightheadedness and falls, patient presents to [facility] emergency department for evaluation.  Upon evaluation in the emergency department patient was found to have profound orthostatic hypotension with a blood pressure dropping from 147/54 all the way to 69/49.  Due to the emergency department provider is concerned as to the patient's safety in the home setting these dramatic drops in blood pressure, hospitalist group has been called to assess the patient for mission the hospital.   Subjective: 7/10 A/O x4, but today appears to  be a little forgetful.  Having to answer the same question multiple times, may also be secondary to her decreased hearing.  States overnight stood up and walked around the room negative lightheadedness, just felt fatigued.    Assessment & Plan: Covid vaccination; positive vaccination   Principal Problem:   Postural dizziness with presyncope Active Problems:   Orthostatic hypotension   Frequent falls   Essential hypertension   Right ear pain   Excessive cerumen in both ear canals   Syncope and collapse   Syncope and collapse -7/7 patient remains with orthostatic hypotension. -Holding lisinopril and HCTZ -7/7 Am Cortisol WNL 11.6 -7/8 TED Hose -7/9 decrease Midodrine 7.5 mg TID; SBP goal 160 -Out of bed to chair q shift  Acute Diastolic CHF -See EKG results below -See syncope and collapse -Patient nor daughter knew that she had acute diastolic CHF.  Spoke with PA Vernona Rieger cardiology, upon discharge agreed to have Dr. Gala Romney Heart Failure team see patient in outpatient CHF clinic  Orthostatic hypotension -see syncope and collapse -7/9 orthostatic hypotension significantly improved. .  Right ear pain -Patient did not complain of ear pain today -In ED started on Debrox drops -Per EMR unable to evaluate further secondary to excessive cerumen -PCP to evaluate and treat  Excessive cerumen in both ear canals -See right ear  Plan of care -7/9 LCSW faxing paperwork over to SNF for authorization.   DVT prophylaxis:  Code Status: Full Family Communication: 7/10 sister present at bedside for discussion of plan of care.   Status is: Inpatient    Dispo: The patient is from: Home              Anticipated  d/c is to: Home vs SNF              Anticipated d/c date is: 7/14              Patient currently unstable      Consultants:    Procedures/Significant Events:  7/7 Eechocardiogram;Left Ventricle: LVEF= 60 to 65%.  -Grade II diastolic dysfunction  (pseudonormalization).       I have personally reviewed and interpreted all radiology studies and my findings are as above.  VENTILATOR SETTINGS:    Cultures 7/6 SARS coronavirus negative  Antimicrobials:    Devices    LINES / TUBES:      Continuous Infusions:    Objective: Vitals:   03/19/20 1739 03/19/20 2333 03/20/20 0618 03/20/20 0841  BP: (!) 186/77 (!) 156/64 (!) 154/71 (!) 145/67  Pulse: 65 60 61   Resp: 16 16 15    Temp: 97.8 F (36.6 C) 98.1 F (36.7 C) 98.1 F (36.7 C)   TempSrc: Oral Oral Oral   SpO2: 98% 96% 96%   Weight:      Height:       No intake or output data in the 24 hours ending 03/20/20 0924 Filed Weights   03/16/20 2003  Weight: 61.2 kg   Physical Exam:  General: A/O x4, No acute respiratory distress Eyes: negative scleral hemorrhage, negative anisocoria, negative icterus ENT: Negative Runny nose, negative gingival bleeding, Neck:  Negative scars, masses, torticollis, lymphadenopathy, JVD Lungs: Clear to auscultation bilaterally without wheezes or crackles Cardiovascular: Regular rate and rhythm without murmur gallop or rub normal S1 and S2 Abdomen: negative abdominal pain, nondistended, positive soft, bowel sounds, no rebound, no ascites, no appreciable mass Extremities: No significant cyanosis, clubbing, or edema bilateral lower extremities Skin: Negative rashes, lesions, ulcers Psychiatric:  Negative depression, negative anxiety, negative fatigue, negative mania  Central nervous system:  Cranial nerves II through XII intact, tongue/uvula midline, all extremities muscle strength 5/5, sensation intact throughout, negative dysarthria, negative expressive aphasia, negative receptive aphasia.   .     Data Reviewed: Care during the described time interval was provided by me .  I have reviewed this patient's available data, including medical history, events of note, physical examination, and all test results as part of my  evaluation.  CBC: Recent Labs  Lab 03/16/20 2215 03/17/20 0444 03/18/20 0252 03/19/20 0521 03/20/20 0442  WBC 5.0 4.3 4.1 4.0 4.0  NEUTROABS 3.3 2.6 2.5 2.3 2.3  HGB 11.5* 11.1* 10.6* 11.1* 11.4*  HCT 36.3 33.8* 32.8* 33.2* 35.4*  MCV 95.3 94.9 94.3 91.5 93.2  PLT 123* 113* 121* 118* 115*   Basic Metabolic Panel: Recent Labs  Lab 03/16/20 2215 03/17/20 0444 03/18/20 0252 03/19/20 0521 03/20/20 0442  NA 137 137 139 138 137  K 4.4 4.0 4.1 4.0 4.1  CL 105 105 108 106 105  CO2 20* 24 25 25 23   GLUCOSE 84 89 91 91 96  BUN 17 13 11 8  6*  CREATININE 0.97 1.00 0.89 0.84 0.90  CALCIUM 9.0 8.4* 8.2* 8.7* 8.6*  MG  --  2.2 2.0 2.2 2.2  PHOS  --   --  2.8 3.2 3.3   GFR: Estimated Creatinine Clearance: 43.3 mL/min (by C-G formula based on SCr of 0.9 mg/dL). Liver Function Tests: Recent Labs  Lab 03/16/20 2215 03/17/20 0444 03/18/20 0252 03/19/20 0521 03/20/20 0442  AST 24 20 25 25  40  ALT 17 15 17 16 30   ALKPHOS 73 60 59 61 65  BILITOT 0.6 0.8 0.7 0.7 0.4  PROT 6.1* 5.4* 5.2* 5.7* 5.9*  ALBUMIN 3.7 3.2* 3.1* 3.2* 3.3*   No results for input(s): LIPASE, AMYLASE in the last 168 hours. No results for input(s): AMMONIA in the last 168 hours. Coagulation Profile: No results for input(s): INR, PROTIME in the last 168 hours. Cardiac Enzymes: No results for input(s): CKTOTAL, CKMB, CKMBINDEX, TROPONINI in the last 168 hours. BNP (last 3 results) No results for input(s): PROBNP in the last 8760 hours. HbA1C: No results for input(s): HGBA1C in the last 72 hours. CBG: Recent Labs  Lab 03/16/20 2008  GLUCAP 85   Lipid Profile: No results for input(s): CHOL, HDL, LDLCALC, TRIG, CHOLHDL, LDLDIRECT in the last 72 hours. Thyroid Function Tests: No results for input(s): TSH, T4TOTAL, FREET4, T3FREE, THYROIDAB in the last 72 hours. Anemia Panel: No results for input(s): VITAMINB12, FOLATE, FERRITIN, TIBC, IRON, RETICCTPCT in the last 72 hours. Sepsis Labs: No results for  input(s): PROCALCITON, LATICACIDVEN in the last 168 hours.  Recent Results (from the past 240 hour(s))  SARS Coronavirus 2 by RT PCR (hospital order, performed in [facility] hospital lab) Nasopharyngeal Nasopharyngeal Swab     Status: None   Collection Time: 03/16/20 11:41 PM   Specimen: Nasopharyngeal Swab  Result Value Ref Range Status   SARS Coronavirus 2 NEGATIVE NEGATIVE Final    Comment: (NOTE) SARS-CoV-2 target nucleic acids are NOT DETECTED.  The SARS-CoV-2 RNA is generally detectable in upper and lower respiratory specimens during the acute phase of infection. The lowest concentration of SARS-CoV-2 viral copies this assay can detect is 250 copies / mL. A negative result does not preclude SARS-CoV-2 infection and should not be used as the sole basis for treatment or other patient management decisions.  A negative result may occur with improper specimen collection / handling, submission of specimen other than nasopharyngeal swab, presence of viral mutation(s) within the areas targeted by this assay, and inadequate number of viral copies (<250 copies / mL). A negative result must be combined with clinical observations, patient history, and epidemiological information.  Fact Sheet for Patients:   BoilerBrush.com.cy  Fact Sheet for Healthcare Providers: https://pope.com/  This test is not yet approved or  cleared by the [place] FDA and has been authorized for detection and/or diagnosis of SARS-CoV-2 by FDA under an Emergency Use Authorization (EUA).  This EUA will remain in effect (meaning this test can be used) for the duration of the COVID-19 declaration under Section 564(b)(1) of the Act, 21 U.S.C. section 360bbb-3(b)(1), unless the authorization is terminated or revoked sooner.  Performed at [facility], [address], [place], [place] [address]          Radiology Studies: No results  found.      Scheduled Meds: . carbamide peroxide  5 drop Both EARS BID  . enoxaparin (LOVENOX) injection  40 mg Subcutaneous Daily  . escitalopram  10 mg Oral Daily  . midodrine  7.5 mg Oral TID WC   Continuous Infusions:    LOS: 3 days    Time spent:40 min    Rebakah Cokley, Roselind Messier, MD [facility] Pager [phone]  If 7PM-7AM, please contact night-coverage [email or web address] Password [facility] 03/20/2020, 9:24 AM

## 2020-03-20 NOTE — TOC Progression Note (Signed)
Transition of Care [facility]) - Progression Note    Patient Details  Name: Kristin Rush MRN: [id number] Date of Birth: 1941-01-23  [facility] CM/SW Contact  Annalee Genta, LCSW Phone Number: 03/20/2020, 1:55 PM  Clinical Narrative: CSW spoke with patent and family regarding SNF bed offers. CSW informed them there has been no response at this time from [facility]. CSW reached out to liaison and left v/m for follow-up. CSW provided list of offered beds and discussed the potential for need to choose another facility for acceptance.   Expected Discharge Plan: Home w Home Health Services Barriers to Discharge: Continued Medical Work up  Expected Discharge Plan and Services Expected Discharge Plan: Home w Home Health Services                                               Social Determinants of Health (SDOH) Interventions    Readmission Risk Interventions No flowsheet data found.

## 2020-03-21 LAB — CBC WITH DIFFERENTIAL/PLATELET
Abs Immature Granulocytes: 0.01 10*3/uL (ref 0.00–0.07)
Basophils Absolute: 0 10*3/uL (ref 0.0–0.1)
Basophils Relative: 1 %
Eosinophils Absolute: 0.1 10*3/uL (ref 0.0–0.5)
Eosinophils Relative: 3 %
HCT: 34.3 % — ABNORMAL LOW (ref 36.0–46.0)
Hemoglobin: 11.4 g/dL — ABNORMAL LOW (ref 12.0–15.0)
Immature Granulocytes: 0 %
Lymphocytes Relative: 27 %
Lymphs Abs: 1.1 10*3/uL (ref 0.7–4.0)
MCH: 30.6 pg (ref 26.0–34.0)
MCHC: 33.2 g/dL (ref 30.0–36.0)
MCV: 92 fL (ref 80.0–100.0)
Monocytes Absolute: 0.4 10*3/uL (ref 0.1–1.0)
Monocytes Relative: 11 %
Neutro Abs: 2.3 10*3/uL (ref 1.7–7.7)
Neutrophils Relative %: 58 %
Platelets: 121 10*3/uL — ABNORMAL LOW (ref 150–400)
RBC: 3.73 MIL/uL — ABNORMAL LOW (ref 3.87–5.11)
RDW: 12.4 % (ref 11.5–15.5)
WBC: 4 10*3/uL (ref 4.0–10.5)
nRBC: 0 % (ref 0.0–0.2)

## 2020-03-21 LAB — COMPREHENSIVE METABOLIC PANEL
ALT: 35 U/L (ref 0–44)
AST: 44 U/L — ABNORMAL HIGH (ref 15–41)
Albumin: 3.2 g/dL — ABNORMAL LOW (ref 3.5–5.0)
Alkaline Phosphatase: 68 U/L (ref 38–126)
Anion gap: 9 (ref 5–15)
BUN: 9 mg/dL (ref 8–23)
CO2: 23 mmol/L (ref 22–32)
Calcium: 8.7 mg/dL — ABNORMAL LOW (ref 8.9–10.3)
Chloride: 103 mmol/L (ref 98–111)
Creatinine, Ser: 0.89 mg/dL (ref 0.44–1.00)
GFR calc Af Amer: >60 mL/min (ref >60)
GFR calc non Af Amer: >60 mL/min (ref >60)
Glucose, Bld: 94 mg/dL (ref 70–99)
Potassium: 4 mmol/L (ref 3.5–5.1)
Sodium: 135 mmol/L (ref 135–145)
Total Bilirubin: 0.5 mg/dL (ref 0.3–1.2)
Total Protein: 5.7 g/dL — ABNORMAL LOW (ref 6.5–8.1)

## 2020-03-21 LAB — PHOSPHORUS: Phosphorus: 3.5 mg/dL (ref 2.5–4.6)

## 2020-03-21 LAB — MAGNESIUM: Magnesium: 2.1 mg/dL (ref 1.7–2.4)

## 2020-03-21 MED ORDER — MIDODRINE HCL 5 MG PO TABS
7.5000 mg | ORAL_TABLET | Freq: Two times a day (BID) | ORAL | Status: DC
  Administered 2020-03-22 – 2020-03-23 (×4): 7.5 mg via ORAL
  Filled 2020-03-21 (×4): qty 2

## 2020-03-21 MED ORDER — MIDODRINE HCL 5 MG PO TABS: 5.0000 mg | ORAL_TABLET | Freq: Three times a day (TID) | ORAL | Status: DC

## 2020-03-21 MED ORDER — MIDODRINE HCL 5 MG PO TABS
5.0000 mg | ORAL_TABLET | Freq: Once | ORAL | Status: AC
  Administered 2020-03-21: 5 mg via ORAL
  Filled 2020-03-21: qty 1

## 2020-03-21 NOTE — Progress Notes (Addendum)
PROGRESS NOTE    Kristin Rush  LOV:[id number] DOB: September 06, 1941 DOA: 03/16/2020 PCP: Eartha Inch, MD     Brief Narrative:  79 year old WF PMHx HTN, orthostatic hypotension, chronic low back pain, lumbar spondylosis, disc degeneration lumbar, bilateral sensorineural hearing loss,  Presents to [facility] emergency department with complaints of postural lightheadedness and presyncope.  Patient is a somewhat poor historian and the majority the history is been obtained from combination of discussions with both patient and her sister who is at the bedside.  Patient explains that she has been suffering with intermittent bouts of postural lightheadedness for several years.  Approximately 1 month ago, patient was placed on lisinopril by her primary care provider for hypertension.  This was initially started at 5 mg daily.  Then, approximately 2 weeks ago patient's lisinopril was increased to 10 mg daily.  At this point, the patient began to experience a dramatic increase in frequency and severity of her episodes of lightheadedness.  This has resulted in frequent falls on a nearly daily basis.  Patient explains that nearly every time she attempts to rise from a seated position she experiences intense lightheadedness and feelings like she is going to blackout.  Symptoms seem to improve with sitting down.  Due to continued frequent bouts of intense lightheadedness and falls, patient presents to [facility] emergency department for evaluation.  Upon evaluation in the emergency department patient was found to have profound orthostatic hypotension with a blood pressure dropping from 147/54 all the way to 69/49.  Due to the emergency department provider is concerned as to the patient's safety in the home setting these dramatic drops in blood pressure, hospitalist group has been called to assess the [facility] for [facility].   Subjective: 7/11 A/O x4, negative CP, negative  S OB, negative abdominal pain.  States that if she is not accepted at [facility] tomorrow they are okay for discharge home    Assessment & Plan: Covid vaccination; positive vaccination   Principal Problem:   Postural dizziness with presyncope Active Problems:   Orthostatic hypotension   Frequent falls   Essential hypertension   Right ear pain   Excessive cerumen in both ear canals   Syncope and collapse   Syncope and collapse -7/7 patient remains with orthostatic hypotension. -Holding lisinopril and HCTZ -7/7 Am Cortisol WNL 11.6 -7/8 TED Hose -7/11 decrease Midodrine 7.5 mg Am/Lunch; 5 mg Evening SBP goal 160 -Out of bed to chair q shift  Acute Diastolic CHF -See EKG results below -See syncope and collapse -Patient nor daughter knew that she had acute diastolic CHF.  Spoke with PA Vernona Rieger cardiology, upon discharge agreed to have Dr. Gala Romney Heart Failure team see patient in outpatient CHF clinic  Orthostatic hypotension -see syncope and collapse -7/9 orthostatic hypotension significantly improved. .  Right ear pain -Patient did not complain of ear pain today -In ED started on Debrox drops -Per EMR unable to evaluate further secondary to excessive cerumen -PCP to evaluate and treat  Excessive cerumen in both ear canals -See right ear  Plan of care -7/9 LCSW faxing paperwork over to [facility] for authorization.   DVT prophylaxis: Lovenox Code Status: Full Family Communication: 7/11 sister present at bedside for discussion of plan of care.   Status is: Inpatient    Dispo: The patient is from: Home              Anticipated d/c is to: Home vs SNF  Anticipated d/c date is: 7/14              Patient currently unstable      Consultants:    Procedures/Significant Events:  7/7 Eechocardiogram;Left Ventricle: LVEF= 60 to 65%.  -Grade II diastolic dysfunction (pseudonormalization).       I have personally reviewed and  interpreted all radiology studies and my findings are as above.  VENTILATOR SETTINGS:    Cultures 7/6 SARS coronavirus negative  Antimicrobials:    Devices    LINES / TUBES:      Continuous Infusions:    Objective: Vitals:   03/20/20 2345 03/21/20 0539 03/21/20 0746 03/21/20 1000  BP: (!) 185/78 (!) 173/72  (!) 193/85  Pulse: 66 67 67 63  Resp: 17 18 18 18   Temp: 98 F (36.7 C) 98.1 F (36.7 C) 98.5 F (36.9 C) 98 F (36.7 C)  TempSrc: Oral Oral Oral Oral  SpO2: 97% 97% 96% 96%  Weight:      Height:        Intake/Output Summary (Last 24 hours) at 03/21/2020 1628 Last data filed at 03/21/2020 0751 Gross per 24 hour  Intake 120 ml  Output --  Net 120 ml   Filed Weights   03/16/20 2003  Weight: 61.2 kg   Physical Exam:  General: A/O x4, No acute respiratory distress Eyes: negative scleral hemorrhage, negative anisocoria, negative icterus ENT: Negative Runny nose, negative gingival bleeding, Neck:  Negative scars, masses, torticollis, lymphadenopathy, JVD Lungs: Clear to auscultation bilaterally without wheezes or crackles Cardiovascular: Regular rate and rhythm without murmur gallop or rub normal S1 and S2 Abdomen: negative abdominal pain, nondistended, positive soft, bowel sounds, no rebound, no ascites, no appreciable mass Extremities: No significant cyanosis, clubbing, or edema bilateral lower extremities Skin: Negative rashes, lesions, ulcers Psychiatric:  Negative depression, negative anxiety, negative fatigue, negative mania  Central nervous system:  Cranial nerves II through XII intact, tongue/uvula midline, all extremities muscle strength 5/5, sensation intact throughout, negative dysarthria, negative expressive aphasia, negative receptive aphasia.   .     Data Reviewed: Care during the described time interval was provided by me .  I have reviewed this patient's available data, including medical history, events of note, physical examination,  and all test results as part of my evaluation.  CBC: Recent Labs  Lab 03/17/20 0444 03/18/20 0252 03/19/20 0521 03/20/20 0442 03/21/20 0210  WBC 4.3 4.1 4.0 4.0 4.0  NEUTROABS 2.6 2.5 2.3 2.3 2.3  HGB 11.1* 10.6* 11.1* 11.4* 11.4*  HCT 33.8* 32.8* 33.2* 35.4* 34.3*  MCV 94.9 94.3 91.5 93.2 92.0  PLT 113* 121* 118* 115* 121*   Basic Metabolic Panel: Recent Labs  Lab 03/17/20 0444 03/18/20 0252 03/19/20 0521 03/20/20 0442 03/21/20 0210  NA 137 139 138 137 135  K 4.0 4.1 4.0 4.1 4.0  CL 105 108 106 105 103  CO2 24 25 25 23 23   GLUCOSE 89 91 91 96 94  BUN 13 11 8  6* 9  CREATININE 1.00 0.89 0.84 0.90 0.89  CALCIUM 8.4* 8.2* 8.7* 8.6* 8.7*  MG 2.2 2.0 2.2 2.2 2.1  PHOS  --  2.8 3.2 3.3 3.5   GFR: Estimated Creatinine Clearance: 43.8 mL/min (by C-G formula based on SCr of 0.89 mg/dL). Liver Function Tests: Recent Labs  Lab 03/17/20 0444 03/18/20 0252 03/19/20 0521 03/20/20 0442 03/21/20 0210  AST 20 25 25  40 44*  ALT 15 17 16 30  35  ALKPHOS 60 59 61 65 68  BILITOT  0.8 0.7 0.7 0.4 0.5  PROT 5.4* 5.2* 5.7* 5.9* 5.7*  ALBUMIN 3.2* 3.1* 3.2* 3.3* 3.2*   No results for input(s): LIPASE, AMYLASE in the last 168 hours. No results for input(s): AMMONIA in the last 168 hours. Coagulation Profile: No results for input(s): INR, PROTIME in the last 168 hours. Cardiac Enzymes: No results for input(s): CKTOTAL, CKMB, CKMBINDEX, TROPONINI in the last 168 hours. BNP (last 3 results) No results for input(s): PROBNP in the last 8760 hours. HbA1C: No results for input(s): HGBA1C in the last 72 hours. CBG: Recent Labs  Lab 03/16/20 2008  GLUCAP 85   Lipid Profile: No results for input(s): CHOL, HDL, LDLCALC, TRIG, CHOLHDL, LDLDIRECT in the last 72 hours. Thyroid Function Tests: No results for input(s): TSH, T4TOTAL, FREET4, T3FREE, THYROIDAB in the last 72 hours. Anemia Panel: No results for input(s): VITAMINB12, FOLATE, FERRITIN, TIBC, IRON, RETICCTPCT in the last 72  hours. Sepsis Labs: No results for input(s): PROCALCITON, LATICACIDVEN in the last 168 hours.  Recent Results (from the past 240 hour(s))  SARS Coronavirus 2 by RT PCR (hospital order, performed in [facility] hospital lab) Nasopharyngeal Nasopharyngeal Swab     Status: None   Collection Time: 03/16/20 11:41 PM   Specimen: Nasopharyngeal Swab  Result Value Ref Range Status   SARS Coronavirus 2 NEGATIVE NEGATIVE Final    Comment: (NOTE) SARS-CoV-2 target nucleic acids are NOT DETECTED.  The SARS-CoV-2 RNA is generally detectable in upper and lower respiratory specimens during the acute phase of infection. The lowest concentration of SARS-CoV-2 viral copies this assay can detect is 250 copies / mL. A negative result does not preclude SARS-CoV-2 infection and should not be used as the sole basis for treatment or other patient management decisions.  A negative result may occur with improper specimen collection / handling, submission of specimen other than nasopharyngeal swab, presence of viral mutation(s) within the areas targeted by this assay, and inadequate number of viral copies (<250 copies / mL). A negative result must be combined with clinical observations, patient history, and epidemiological information.  Fact Sheet for Patients:   BoilerBrush.com.cy  Fact Sheet for Healthcare Providers: https://pope.com/  This test is not yet approved or  cleared by the [place] FDA and has been authorized for detection and/or diagnosis of SARS-CoV-2 by FDA under an Emergency Use Authorization (EUA).  This EUA will remain in effect (meaning this test can be used) for the duration of the COVID-19 declaration under Section 564(b)(1) of the Act, 21 U.S.C. section 360bbb-3(b)(1), unless the authorization is terminated or revoked sooner.  Performed at [facility], [address] [place], [place] [address]          Radiology  Studies: No results found.      Scheduled Meds: . enoxaparin (LOVENOX) injection  40 mg Subcutaneous Daily  . escitalopram  10 mg Oral Daily  . midodrine  7.5 mg Oral TID WC   Continuous Infusions:    LOS: 4 days    Time spent:40 min    Siennah Barrasso, Roselind Messier, MD [facility] Pager [phone]  If 7PM-7AM, please contact night-coverage [email or web address] Password [facility] 03/21/2020, 4:28 PM

## 2020-03-22 LAB — CBC WITH DIFFERENTIAL/PLATELET
Abs Immature Granulocytes: 0.01 10*3/uL (ref 0.00–0.07)
Basophils Absolute: 0 10*3/uL (ref 0.0–0.1)
Basophils Relative: 1 %
Eosinophils Absolute: 0.1 10*3/uL (ref 0.0–0.5)
Eosinophils Relative: 3 %
HCT: 34.8 % — ABNORMAL LOW (ref 36.0–46.0)
Hemoglobin: 11.6 g/dL — ABNORMAL LOW (ref 12.0–15.0)
Immature Granulocytes: 0 %
Lymphocytes Relative: 31 %
Lymphs Abs: 1.2 10*3/uL (ref 0.7–4.0)
MCH: 30.4 pg (ref 26.0–34.0)
MCHC: 33.3 g/dL (ref 30.0–36.0)
MCV: 91.1 fL (ref 80.0–100.0)
Monocytes Absolute: 0.4 10*3/uL (ref 0.1–1.0)
Monocytes Relative: 12 %
Neutro Abs: 2 10*3/uL (ref 1.7–7.7)
Neutrophils Relative %: 53 %
Platelets: 121 10*3/uL — ABNORMAL LOW (ref 150–400)
RBC: 3.82 MIL/uL — ABNORMAL LOW (ref 3.87–5.11)
RDW: 12.6 % (ref 11.5–15.5)
WBC: 3.8 10*3/uL — ABNORMAL LOW (ref 4.0–10.5)
nRBC: 0 % (ref 0.0–0.2)

## 2020-03-22 LAB — PHOSPHORUS: Phosphorus: 4 mg/dL (ref 2.5–4.6)

## 2020-03-22 LAB — COMPREHENSIVE METABOLIC PANEL
ALT: 45 U/L — ABNORMAL HIGH (ref 0–44)
AST: 48 U/L — ABNORMAL HIGH (ref 15–41)
Albumin: 3.4 g/dL — ABNORMAL LOW (ref 3.5–5.0)
Alkaline Phosphatase: 67 U/L (ref 38–126)
Anion gap: 10 (ref 5–15)
BUN: 11 mg/dL (ref 8–23)
CO2: 26 mmol/L (ref 22–32)
Calcium: 9.1 mg/dL (ref 8.9–10.3)
Chloride: 102 mmol/L (ref 98–111)
Creatinine, Ser: 0.96 mg/dL (ref 0.44–1.00)
GFR calc Af Amer: >60 mL/min (ref >60)
GFR calc non Af Amer: 57 mL/min — ABNORMAL LOW (ref >60)
Glucose, Bld: 89 mg/dL (ref 70–99)
Potassium: 4 mmol/L (ref 3.5–5.1)
Sodium: 138 mmol/L (ref 135–145)
Total Bilirubin: 0.7 mg/dL (ref 0.3–1.2)
Total Protein: 5.9 g/dL — ABNORMAL LOW (ref 6.5–8.1)

## 2020-03-22 LAB — SARS CORONAVIRUS 2 (TAT 6-24 HRS): SARS Coronavirus 2: NEGATIVE

## 2020-03-22 LAB — MAGNESIUM: Magnesium: 2.1 mg/dL (ref 1.7–2.4)

## 2020-03-22 NOTE — Progress Notes (Signed)
PROGRESS NOTE    Kristin Rush  CWC:[id number] DOB: 11-12-1940 DOA: 03/16/2020 PCP: Eartha Inch, MD     Brief Narrative:  79 year old WF PMHx HTN, orthostatic hypotension, chronic low back pain, lumbar spondylosis, disc degeneration lumbar, bilateral sensorineural hearing loss,  Presents to [facility] emergency department with complaints of postural lightheadedness and presyncope.  Patient is a somewhat poor historian and the majority the history is been obtained from combination of discussions with both patient and her sister who is at the bedside.  Patient explains that she has been suffering with intermittent bouts of postural lightheadedness for several years.  Approximately 1 month ago, patient was placed on lisinopril by her primary care provider for hypertension.  This was initially started at 5 mg daily.  Then, approximately 2 weeks ago patient's lisinopril was increased to 10 mg daily.  At this point, the patient began to experience a dramatic increase in frequency and severity of her episodes of lightheadedness.  This has resulted in frequent falls on a nearly daily basis.  Patient explains that nearly every time she attempts to rise from a seated position she experiences intense lightheadedness and feelings like she is going to blackout.  Symptoms seem to improve with sitting down.  Due to continued frequent bouts of intense lightheadedness and falls, patient presents to [facility] emergency department for evaluation.  Upon evaluation in the emergency department patient was found to have profound orthostatic hypotension with a blood pressure dropping from 147/54 all the way to 69/49.  Due to the emergency department provider is concerned as to the patient's safety in the home setting these dramatic drops in blood pressure, hospitalist group has been called to assess the patient for [facility].   Subjective: 7/12 A/O x4, negative CP, negative  S OB, negative abdominal pain.  Patient had a presyncopal event earlier this morning however she had not taken her midodrine yet nor did she have her TED hose on when she got up.   Assessment & Plan: Covid vaccination; positive vaccination   Principal Problem:   Postural dizziness with presyncope Active Problems:   Orthostatic hypotension   Frequent falls   Essential hypertension   Right ear pain   Excessive cerumen in both ear canals   Syncope and collapse   Syncope and collapse -7/7 patient remains with orthostatic hypotension. -Holding lisinopril and HCTZ -7/7 Am Cortisol WNL 11.6 -7/8 TED Hose -7/11 decrease Midodrine 7.5 mg Am/Lunch; 5 mg Evening SBP goal 160 -Out of bed to chair q shift  Acute Diastolic CHF -See EKG results below -See syncope and collapse -Patient nor daughter knew that she had acute diastolic CHF.  Spoke with PA Vernona Rieger cardiology, upon discharge agreed to have Dr. Gala Romney Heart Failure team see patient in outpatient CHF clinic  Orthostatic hypotension -see syncope and collapse -7/9 orthostatic hypotension significantly improved. .  Right ear pain -Patient did not complain of ear pain today -In ED started on Debrox drops -Per EMR unable to evaluate further secondary to excessive cerumen -PCP to evaluate and treat  Excessive cerumen in both ear canals -See right ear  Plan of care -7/9 LCSW faxing paperwork over to [facility] for authorization.   DVT prophylaxis: Lovenox Code Status: Full Family Communication: 7/12 sister present at bedside for discussion of plan of care.   Status is: Inpatient    Dispo: The patient is from: Home  Anticipated d/c is to: Home vs SNF              Anticipated d/c date is: 7/13 patient was to be discharged today however at the last minute [facility] demanded updated Covid test.              Patient currently unstable      Consultants:    Procedures/Significant  Events:  7/7 Eechocardiogram;Left Ventricle: LVEF= 60 to 65%.  -Grade II diastolic dysfunction (pseudonormalization).       I have personally reviewed and interpreted all radiology studies and my findings are as above.  VENTILATOR SETTINGS:    Cultures 7/6 SARS coronavirus negative  Antimicrobials:    Devices    LINES / TUBES:      Continuous Infusions:    Objective: Vitals:   03/22/20 0601 03/22/20 0700 03/22/20 0740 03/22/20 1227  BP: (!) 167/71 (!) 82/67 114/64 (!) 153/55  Pulse: 64   67  Resp: 16   16  Temp: 98 F (36.7 C)   97.7 F (36.5 C)  TempSrc: Oral   Oral  SpO2: 95%   95%  Weight:      Height:        Intake/Output Summary (Last 24 hours) at 03/22/2020 1717 Last data filed at 03/22/2020 1102 Gross per 24 hour  Intake 480 ml  Output --  Net 480 ml   Filed Weights   03/16/20 2003  Weight: 61.2 kg   Physical Exam:  General: A/O x4, No acute respiratory distress Eyes: negative scleral hemorrhage, negative anisocoria, negative icterus ENT: Negative Runny nose, negative gingival bleeding, Neck:  Negative scars, masses, torticollis, lymphadenopathy, JVD Lungs: Clear to auscultation bilaterally without wheezes or crackles Cardiovascular: Regular rate and rhythm without murmur gallop or rub normal S1 and S2 Abdomen: negative abdominal pain, nondistended, positive soft, bowel sounds, no rebound, no ascites, no appreciable mass Extremities: No significant cyanosis, clubbing, or edema bilateral lower extremities Skin: Negative rashes, lesions, ulcers Psychiatric:  Negative depression, negative anxiety, negative fatigue, negative mania  Central nervous system:  Cranial nerves II through XII intact, tongue/uvula midline, all extremities muscle strength 5/5, sensation intact throughout, negative dysarthria, negative expressive aphasia, negative receptive aphasia.   .     Data Reviewed: Care during the described time interval was provided by me .   I have reviewed this patient's available data, including medical history, events of note, physical examination, and all test results as part of my evaluation.  CBC: Recent Labs  Lab 03/18/20 0252 03/19/20 0521 03/20/20 0442 03/21/20 0210 03/22/20 0353  WBC 4.1 4.0 4.0 4.0 3.8*  NEUTROABS 2.5 2.3 2.3 2.3 2.0  HGB 10.6* 11.1* 11.4* 11.4* 11.6*  HCT 32.8* 33.2* 35.4* 34.3* 34.8*  MCV 94.3 91.5 93.2 92.0 91.1  PLT 121* 118* 115* 121* 121*   Basic Metabolic Panel: Recent Labs  Lab 03/18/20 0252 03/19/20 0521 03/20/20 0442 03/21/20 0210 03/22/20 0353  NA 139 138 137 135 138  K 4.1 4.0 4.1 4.0 4.0  CL 108 106 105 103 102  CO2 25 25 23 23 26   GLUCOSE 91 91 96 94 89  BUN 11 8 6* 9 11  CREATININE 0.89 0.84 0.90 0.89 0.96  CALCIUM 8.2* 8.7* 8.6* 8.7* 9.1  MG 2.0 2.2 2.2 2.1 2.1  PHOS 2.8 3.2 3.3 3.5 4.0   GFR: Estimated Creatinine Clearance: 40.6 mL/min (by C-G formula based on SCr of 0.96 mg/dL). Liver Function Tests: Recent Labs  Lab 03/18/20 0252 03/19/20  1610 03/20/20 0442 03/21/20 0210 03/22/20 0353  AST 25 25 40 44* 48*  ALT 17 16 30  35 45*  ALKPHOS 59 61 65 68 67  BILITOT 0.7 0.7 0.4 0.5 0.7  PROT 5.2* 5.7* 5.9* 5.7* 5.9*  ALBUMIN 3.1* 3.2* 3.3* 3.2* 3.4*   No results for input(s): LIPASE, AMYLASE in the last 168 hours. No results for input(s): AMMONIA in the last 168 hours. Coagulation Profile: No results for input(s): INR, PROTIME in the last 168 hours. Cardiac Enzymes: No results for input(s): CKTOTAL, CKMB, CKMBINDEX, TROPONINI in the last 168 hours. BNP (last 3 results) No results for input(s): PROBNP in the last 8760 hours. HbA1C: No results for input(s): HGBA1C in the last 72 hours. CBG: Recent Labs  Lab 03/16/20 2008  GLUCAP 85   Lipid Profile: No results for input(s): CHOL, HDL, LDLCALC, TRIG, CHOLHDL, LDLDIRECT in the last 72 hours. Thyroid Function Tests: No results for input(s): TSH, T4TOTAL, FREET4, T3FREE, THYROIDAB in the last 72  hours. Anemia Panel: No results for input(s): VITAMINB12, FOLATE, FERRITIN, TIBC, IRON, RETICCTPCT in the last 72 hours. Sepsis Labs: No results for input(s): PROCALCITON, LATICACIDVEN in the last 168 hours.  Recent Results (from the past 240 hour(s))  SARS Coronavirus 2 by RT PCR (hospital order, performed in [facility] hospital lab) Nasopharyngeal Nasopharyngeal Swab     Status: None   Collection Time: 03/16/20 11:41 PM   Specimen: Nasopharyngeal Swab  Result Value Ref Range Status   SARS Coronavirus 2 NEGATIVE NEGATIVE Final    Comment: (NOTE) SARS-CoV-2 target nucleic acids are NOT DETECTED.  The SARS-CoV-2 RNA is generally detectable in upper and lower respiratory specimens during the acute phase of infection. The lowest concentration of SARS-CoV-2 viral copies this assay can detect is 250 copies / mL. A negative result does not preclude SARS-CoV-2 infection and should not be used as the sole basis for treatment or other patient management decisions.  A negative result may occur with improper specimen collection / handling, submission of specimen other than nasopharyngeal swab, presence of viral mutation(s) within the areas targeted by this assay, and inadequate number of viral copies (<250 copies / mL). A negative result must be combined with clinical observations, patient history, and epidemiological information.  Fact Sheet for Patients:   05/17/20  Fact Sheet for Healthcare Providers: BoilerBrush.com.cy  This test is not yet approved or  cleared by the https://pope.com/ FDA and has been authorized for detection and/or diagnosis of SARS-CoV-2 by FDA under an Emergency Use Authorization (EUA).  This EUA will remain in effect (meaning this test can be used) for the duration of the COVID-19 declaration under Section 564(b)(1) of the Act, 21 U.S.C. section 360bbb-3(b)(1), unless the authorization is terminated or revoked  sooner.  Performed at [facility], [address], [place], [place]          Radiology Studies: No results found.      Scheduled Meds: . enoxaparin (LOVENOX) injection  40 mg Subcutaneous Daily  . escitalopram  10 mg Oral Daily  . midodrine  7.5 mg Oral BID WC   Continuous Infusions:    LOS: 5 days    Time spent:40 min    Jeania Nater, [id number], MD [facility] Pager [phone]  If 7PM-7AM, please contact night-coverage [email or web address] Password [facility] 03/22/2020, 5:17 PM

## 2020-03-22 NOTE — Progress Notes (Signed)
Patient had another snycopal episode this morning due to BP decreasing when standing. Pt also stated she felt dizzy during event. BP 80s systolic when taken after event. Pt sat down in chair and stated symptoms resolving. BP retaken and vitals was WDL. Provider was notified.

## 2020-03-22 NOTE — TOC Progression Note (Signed)
Transition of Care [facility]) - Progression Note    Patient Details  Name: Kristin Rush MRN: [id number] Date of Birth: 09-25-1940  Transition of Care [facility]) CM/SW Contact  Doy Hutching, [place] Phone Number: 03/22/2020, 5:28 PM  Clinical Narrative:    Pt auth received [facility] Ref #[id number] [employer] Berkley Harvey #[id number] Approved 7/12-7/14.   TOC supervisor Verdon Cummins assisted this Clinical research associate by calling family. No answer, message left. Pt COVID pending, d/c tomorrow if resulted per MD.    Expected Discharge Plan: Skilled Nursing Facility Barriers to Discharge: Continued Medical Work up  Expected Discharge Plan and Services Expected Discharge Plan: Skilled Nursing Facility     Readmission Risk Interventions No flowsheet data found.

## 2020-03-22 NOTE — TOC Progression Note (Addendum)
Transition of Care [facility]) - Progression Note    Patient Details  Name: Kristin Rush MRN: [id number] Date of Birth: 1940-09-26  Transition of Care [facility]) CM/SW Contact  Doy Hutching, [place] Phone Number: 03/22/2020, 8:30 AM  Clinical Narrative:    11:22am- CSW spoke with Maralyn Sago at [facility] They have accepted pt, CSW called and added this facility to [place]. Auth still pending, new COVID pending.   8:30am- CSW has f/u with [facility] Medicare, also reached out to [facility]. Pt will need new COVID swab, MD aware.   Expected Discharge Plan: Home w Home Health Services Barriers to Discharge: Continued Medical Work up  Expected Discharge Plan and Services Expected Discharge Plan: Home w Home Health Services  Readmission Risk Interventions No flowsheet data found.

## 2020-03-23 LAB — CBC WITH DIFFERENTIAL/PLATELET
Abs Immature Granulocytes: 0.01 10*3/uL (ref 0.00–0.07)
Basophils Absolute: 0 10*3/uL (ref 0.0–0.1)
Basophils Relative: 1 %
Eosinophils Absolute: 0.1 10*3/uL (ref 0.0–0.5)
Eosinophils Relative: 3 %
HCT: 37 % (ref 36.0–46.0)
Hemoglobin: 12.1 g/dL (ref 12.0–15.0)
Immature Granulocytes: 0 %
Lymphocytes Relative: 28 %
Lymphs Abs: 1.2 10*3/uL (ref 0.7–4.0)
MCH: 30.4 pg (ref 26.0–34.0)
MCHC: 32.7 g/dL (ref 30.0–36.0)
MCV: 93 fL (ref 80.0–100.0)
Monocytes Absolute: 0.5 10*3/uL (ref 0.1–1.0)
Monocytes Relative: 12 %
Neutro Abs: 2.4 10*3/uL (ref 1.7–7.7)
Neutrophils Relative %: 56 %
Platelets: 132 10*3/uL — ABNORMAL LOW (ref 150–400)
RBC: 3.98 MIL/uL (ref 3.87–5.11)
RDW: 13 % (ref 11.5–15.5)
WBC: 4.2 10*3/uL (ref 4.0–10.5)
nRBC: 0 % (ref 0.0–0.2)

## 2020-03-23 LAB — MAGNESIUM: Magnesium: 2.2 mg/dL (ref 1.7–2.4)

## 2020-03-23 LAB — PHOSPHORUS: Phosphorus: 3.9 mg/dL (ref 2.5–4.6)

## 2020-03-23 MED ORDER — MIDODRINE HCL 5 MG PO TABS: 5.0000 mg | ORAL_TABLET | Freq: Every day | ORAL | 0 refills | Status: DC

## 2020-03-23 MED ORDER — MIDODRINE HCL 2.5 MG PO TABS: 7.5000 mg | ORAL_TABLET | Freq: Two times a day (BID) | ORAL | 0 refills | Status: DC

## 2020-03-23 MED ORDER — ONDANSETRON HCL 4 MG PO TABS: 4.0000 mg | ORAL_TABLET | Freq: Four times a day (QID) | ORAL | 0 refills | Status: DC | PRN

## 2020-03-23 MED ORDER — POLYETHYLENE GLYCOL 3350 17 G PO PACK: 17.0000 g | PACK | Freq: Every day | ORAL | 0 refills | Status: DC | PRN

## 2020-03-23 NOTE — Progress Notes (Signed)
Physical Therapy Treatment Patient Details Name: Kristin Rush MRN: [id number] DOB: [birth year] Today's Date: 03/23/2020    History of Present Illness Pt is a 79 yo female with onset of presyncope with standing and falls recently.  Has negative MRI but positive orthostatic testing in ED.  Did show dilatation of lateral ventricles, ischemia, remote stroke.  PMHx:  lacunar infarct in the right basal ganglia    PT Comments    Pt hoping to d/c to SNF today. Did not experience dizziness during session. Performed supine LE and UE there ex before sitting, then seated UE and LE there, and then standing UE and LE there ex. Pt ambulated 50' with min A with frequent staggering during changes in direction. Discussed the need for RW when ambulating without therapist. PT will continue to follow.    Follow Up Recommendations  SNF;Supervision for mobility/OOB     Equipment Recommendations  None recommended by PT    Recommendations for Other Services       Precautions / Restrictions Precautions Precautions: Fall Precaution Comments: monitor vitals esp BP Restrictions Weight Bearing Restrictions: No    Mobility  Bed Mobility Overal bed mobility: Needs Assistance Bed Mobility: Supine to Sit     Supine to sit: Supervision Sit to supine: Supervision   General bed mobility comments: no physical assist needed but close supervision given as pt had posterior bias in sitting with difficulty getting feet to floor  Transfers Overall transfer level: Needs assistance Equipment used: None Transfers: Sit to/from Stand Sit to Stand: Min guard         General transfer comment: min-guard A for safety, no physical assist given  Ambulation/Gait Ambulation/Gait assistance: Min assist Gait Distance (Feet): 50 Feet Assistive device: None Gait Pattern/deviations: Step-through pattern;Decreased stride length;Staggering right;Staggering left Gait velocity: decreased Gait velocity interpretation:  <1.8 ft/sec, indicate of risk for recurrent falls General Gait Details: pt with occasional staggering gait and LOB with changes in direction. Discussed need for RW when pt not ambulating with therapy.    Stairs             Wheelchair Mobility    Modified Rankin (Stroke Patients Only)       Balance Overall balance assessment: Needs assistance Sitting-balance support: Feet supported Sitting balance-Leahy Scale: Fair   Postural control: Posterior lean Standing balance support: Single extremity supported Standing balance-Leahy Scale: Poor Standing balance comment: UE support for safety                            Cognition Arousal/Alertness: Awake/alert Behavior During Therapy: WFL for tasks assessed/performed Overall Cognitive Status: History of cognitive impairments - at baseline                                        Exercises General Exercises - Upper Extremity Shoulder Flexion: AROM;Both;10 reps;Standing;Supine;Seated General Exercises - Lower Extremity Ankle Circles/Pumps: AAROM;15 reps;Supine Long Arc Quad: AROM;Both;10 reps;Seated Heel Slides: AROM;Both;10 reps;Supine Straight Leg Raises: AROM;Both;10 reps;Supine Hip Flexion/Marching: AROM;Both;10 reps;Standing    General Comments General comments (skin integrity, edema, etc.): performed supine exercises before sitting up, then seated exercises, then standing before ambulation. Pt did not experience dizziness during session today      Pertinent Vitals/Pain Pain Assessment: No/denies pain    Home Living  Prior Function            PT Goals (current goals can now be found in the care plan section) Acute Rehab PT Goals Patient Stated Goal: to go home PT Goal Formulation: With patient Time For Goal Achievement: 03/31/20 Potential to Achieve Goals: Good Progress towards PT goals: Progressing toward goals    Frequency    Min 3X/week       PT Plan Current plan remains appropriate    Co-evaluation              AM-PAC PT "6 Clicks" Mobility   Outcome Measure  Help needed turning from your back to your side while in a flat bed without using bedrails?: None Help needed moving from lying on your back to sitting on the side of a flat bed without using bedrails?: None Help needed moving to and from a bed to a chair (including a wheelchair)?: A Little Help needed standing up from a chair using your arms (e.g., wheelchair or bedside chair)?: A Little Help needed to walk in hospital room?: A Little Help needed climbing 3-5 steps with a railing? : A Lot 6 Click Score: 19    End of Session Equipment Utilized During Treatment: Gait belt Activity Tolerance: Patient tolerated treatment well Patient left: in bed;with call bell/phone within reach;with bed alarm set Nurse Communication: Mobility status PT Visit Diagnosis: Unsteadiness on feet (R26.81);Muscle weakness (generalized) (M62.81);Difficulty in walking, not elsewhere classified (R26.2)     Time: 0263-7858 PT Time Calculation (min) (ACUTE ONLY): 18 min  Charges:  $Gait Training: 8-22 mins                     Lyanne Co, PT  Acute Rehab Services  Pager [phone] Office [phone]    Lawana Chambers Shyanne Mcclary 03/23/2020, 4:39 PM

## 2020-03-23 NOTE — Discharge Summary (Signed)
Physician Discharge Summary  Kristin RotaSue Hudson Rush ZOX:[id number] DOB: 1940/11/04 DOA: 03/16/2020  PCP: Eartha InchBadger, Michael C, MD  Admit date: 03/16/2020 Discharge date: 03/23/2020  Time spent: 30 minutes  Recommendations for Outpatient Follow-up:   Covid vaccination; positive vaccination   Syncope and collapse -7/7 patient remains with orthostatic hypotension. -Holding lisinopril and HCTZ -7/7 Am Cortisol WNL 11.6 -7/8 TED Hose -7/11 decrease Midodrine 7.5 mg Am/Lunch; 5 mg Evening SBP goal 160  Acute Diastolic CHF -See EKG results below -See syncope and collapse -Patient nor daughter knew that she had acute diastolic CHF.  Spoke with PA Vernona RiegerLaura cardiology, upon discharge agreed to have Dr. Gala RomneyBensimhon Heart Failure team see patient in outpatient CHF clinic  Orthostatic hypotension -see syncope and collapse -7/9 orthostatic hypotension significantly improved. .  Right ear pain -Patient did not complain of ear pain today -In ED started on Debrox drops -Per EMR unable to evaluate further secondary to excessive cerumen -PCP to evaluate and treat  Excessive cerumen in both ear canals -See right ear  Plan of care -7/9 LCSW faxing paperwork [facility] for [facility].    Discharge Diagnoses:  Principal Problem:   Postural dizziness with presyncope Active Problems:   Orthostatic hypotension   Frequent falls   Essential hypertension   Right ear pain   Excessive cerumen in both ear canals   Syncope and collapse   Discharge Condition: Stable  Diet recommendation: Heart healthy  Filed Weights   03/16/20 2003  Weight: 61.2 kg    History of present illness:  79 year old WF PMHx HTN, orthostatic hypotension, chronic low back pain, lumbar spondylosis, disc degeneration lumbar, bilateral sensorineural hearing loss,  P[facility] department with complaints of postural lightheadedness and presyncope.  Patient is a somewhat  poor historian and the majority the history is been obtained from combination of discussions with both patient and her sister who is at the bedside.  Patient explains that she has been suffering with intermittent bouts of postural lightheadedness for several years. Approximately 1 month ago, patient was placed on lisinopril by her primary care provider for hypertension. This was initially started at 5 mg daily. Then, approximately 2 weeks ago patient's lisinopril was increased to 10 mg daily. At this point, the patient began to experience a dramatic increase in frequency and severity of her episodes of lightheadedness. This has resulted in frequent falls on a nearly daily basis. Patient explains that nearly every time she attempts to rise from a seated position she experiences intense lightheadedness and feelings like she is going to blackout. Symptoms seem to improve with sitting down.  Due to continued frequent bouts of intense lightheadedness and falls, patient presents to [facility] emergency department for evaluation.  Upon evaluation in the emergency department patient was found to have profound orthostatic hypotension with a blood pressure dropping from 147/54 all the way to 69/49. Due to the emergency department provider is concerned as to the patient's safety in the home setting these dramatic drops in blood pressure, hospitalist group has been called to assess the patient for mission the hospital.  Hospital Course:  See above  Procedures: 7/7 Eechocardiogram;Left Ventricle: LVEF= 60 to 65%.  -Grade II diastolic dysfunction (pseudonormalization).    Cultures  7/6 SARS coronavirus negative  7/12 SARS coronavirus negative    Discharge Exam: Vitals:   03/22/20 1227 03/22/20 1802 03/23/20 0042 03/23/20 0525  BP: (!) 153/55 138/87 (!) 142/63 129/69  Pulse: 67 71 67 68  Resp:  Temp: 97.7 F (36.5 C) 97.7 F (36.5 C) 98 F (36.7 C) 98.1 F (36.7 C)   TempSrc: Oral Oral Oral Oral  SpO2: 95% 97% 95% 95%  Weight:      Height:        General: A/O x4, No acute respiratory distress Eyes: negative scleral hemorrhage, negative anisocoria, negative icterus ENT: Negative Runny nose, negative gingival bleeding, Neck:  Negative scars, masses, torticollis, lymphadenopathy, JVD Lungs: Clear to auscultation bilaterally without wheezes or crackles Cardiovascular: Regular rate and rhythm without murmur gallop or rub normal S1 and S2   Discharge Instructions   Allergies as of 03/23/2020      Reactions   Codeine Nausea And Vomiting   Sulfa Antibiotics       Medication List    STOP taking these medications   dicyclomine 20 MG tablet Commonly known as: BENTYL   lisinopril 10 MG tablet Commonly known as: ZESTRIL   ondansetron 4 MG disintegrating tablet Commonly known as: ZOFRAN-ODT     TAKE these medications   acetaminophen 500 MG tablet Commonly known as: TYLENOL Take 500 mg by mouth daily as needed for mild pain.   calcium-vitamin D 500-200 MG-UNIT tablet Commonly known as: OSCAL WITH D Take 1 tablet by mouth daily.   cyanocobalamin 1000 MCG tablet Take 1,000 mcg by mouth daily.   escitalopram 10 MG tablet Commonly known as: LEXAPRO Take 10 mg by mouth daily.   ibuprofen 200 MG tablet Commonly known as: ADVIL Take 600 mg by mouth daily as needed for moderate pain. For pain   midodrine 2.5 MG tablet Commonly known as: PROAMATINE Take 3 tablets (7.5 mg total) by mouth 2 (two) times daily with breakfast and lunch.   midodrine 5 MG tablet Commonly known as: PROAMATINE Take 1 tablet (5 mg total) by mouth at bedtime.   ondansetron 4 MG tablet Commonly known as: ZOFRAN Take 1 tablet (4 mg total) by mouth every 6 (six) hours as needed for nausea.   polyethylene glycol 17 g packet Commonly known as: MIRALAX / GLYCOLAX Take 17 g by mouth daily as needed for mild constipation.   PreserVision AREDS Tabs Take 1 tablet by  mouth daily.      Allergies  Allergen Reactions  . Codeine Nausea And Vomiting  . Sulfa Antibiotics     Contact information for after-discharge care    Destination    [facility] Preferred SNF .   Service: Skilled Nursing Contact information: [address] [place] [address] [phone]                   The results of significant diagnostics from this hospitalization (including imaging, microbiology, ancillary and laboratory) are listed below for reference.    Significant Diagnostic Studies: CT Head Wo Contrast  Result Date: 03/16/2020 CLINICAL DATA:  Ataxia, stroke suspected falls, bilateral leg weakness EXAM: CT HEAD WITHOUT CONTRAST TECHNIQUE: Contiguous axial images were obtained from the base of the skull through the vertex without intravenous contrast. COMPARISON:  Most recent head CT 10/03/2011 FINDINGS: Brain: No hemorrhage or evidence of acute ischemia. There is a remote lacunar infarct in the right basal ganglia. Mild generalized atrophy and chronic small vessel ischemia. Dilatation of the lateral ventricles is greater than the degree of sulcal prominence. No extra-axial or subdural collection. Punctate left basal ganglia calcification is likely senescent. Vascular: Atherosclerosis of skullbase vasculature without hyperdense vessel or abnormal calcification. Skull: No fracture.  Small midline frontal skull osteoma. No suspicious lesion. Sinuses/Orbits: Paranasal sinuses and mastoid air cells are clear. The visualized orbits are unremarkable. Other: None. IMPRESSION: 1. No acute intracranial abnormality. 2. Dilatation of the lateral ventricles is greater than the degree of sulcal prominence, can be seen with normal pressure hydrocephalus in the appropriate clinical setting. 3. Mild chronic small vessel ischemia. Remote lacunar infarct in the right basal ganglia. Electronically Signed   By: Narda Rutherford M.D.   On:  03/16/2020 21:55   ECHOCARDIOGRAM COMPLETE  Result Date: 03/17/2020    ECHOCARDIOGRAM REPORT   Patient Name:   Kristin Rush Date of Exam: 03/17/2020 Medical Rec #:  [id number]         Height:       63.0 in Accession #:    [id number]        Weight:       135.0 lb Date of Birth:  August 14, 1941         BSA:          1.636 m Patient Age:    79 years          BP:           182/76 mmHg Patient Gender: F                 HR:           67 bpm. Exam Location:  Inpatient Procedure: 2D Echo, Cardiac Doppler and Color Doppler Indications:    R55 Syncope  History:        Patient has no prior history of Echocardiogram examinations.                 Orthostatic Hypotension.  Sonographer:    Elmarie Shiley Dance Referring Phys: [id number] Deno Lunger [place] IMPRESSIONS  1. Left ventricular ejection fraction, by estimation, is 60 to 65%. The left ventricle has normal function. The left ventricle has no regional wall motion abnormalities. Left ventricular diastolic parameters are consistent with Grade II diastolic dysfunction (pseudonormalization). Elevated left ventricular end-diastolic pressure.  2. Right ventricular systolic function is normal. The right ventricular size is normal.  3. The mitral valve is normal in structure. Trivial mitral valve regurgitation. No evidence of mitral stenosis.  4. The aortic valve is normal in structure. Aortic valve regurgitation is not visualized. No aortic stenosis is present.  5. The inferior vena cava is normal in size with greater than 50% respiratory variability, suggesting right atrial pressure of 3 mmHg. FINDINGS  Left Ventricle: Left ventricular ejection fraction, by estimation, is 60 to 65%. The left ventricle has normal function. The left ventricle has no regional wall motion abnormalities. The left ventricular internal cavity size was normal in size. There is  no left ventricular hypertrophy. Left ventricular diastolic parameters are consistent with Grade II diastolic dysfunction  (pseudonormalization). Elevated left ventricular end-diastolic pressure. Right Ventricle: The right ventricular size is normal. No increase in right ventricular wall thickness. Right ventricular systolic function is normal. Left Atrium: Left atrial size was normal in size. Right Atrium: Right atrial size was normal in size. Pericardium: There is no evidence of pericardial effusion. Mitral Valve: The mitral valve is normal in structure. Normal mobility of the mitral valve leaflets. Trivial mitral valve regurgitation. No evidence of mitral valve stenosis. Tricuspid Valve: The tricuspid valve is normal in structure. Tricuspid valve regurgitation is trivial. No evidence of tricuspid stenosis. Aortic Valve: The aortic valve is normal in structure. Aortic valve regurgitation is not visualized. No aortic  stenosis is present. Pulmonic Valve: The pulmonic valve was normal in structure. Pulmonic valve regurgitation is not visualized. No evidence of pulmonic stenosis. Aorta: The aortic root is normal in size and structure. Venous: The inferior vena cava is normal in size with greater than 50% respiratory variability, suggesting right atrial pressure of 3 mmHg. IAS/Shunts: No atrial level shunt detected by color flow Doppler.  LEFT VENTRICLE PLAX 2D LVIDd:         3.30 cm  Diastology LVIDs:         2.40 cm  LV e' lateral:   6.53 cm/s LV PW:         1.40 cm  LV E/e' lateral: 18.8 LV IVS:        0.80 cm  LV e' medial:    7.62 cm/s LVOT diam:     1.50 cm  LV E/e' medial:  16.1 LV SV:         42 LV SV Index:   26 LVOT Area:     1.77 cm  RIGHT VENTRICLE             IVC RV Basal diam:  2.20 cm     IVC diam: 1.80 cm RV S prime:     10.00 cm/s TAPSE (M-mode): 2.2 cm LEFT ATRIUM             Index       RIGHT ATRIUM           Index LA diam:        3.40 cm 2.08 cm/m  RA Area:     10.80 cm LA Vol (A2C):   54.7 ml 33.43 ml/m RA Volume:   22.80 ml  13.93 ml/m LA Vol (A4C):   38.9 ml 23.77 ml/m LA Biplane Vol: 46.8 ml 28.60 ml/m   AORTIC VALVE LVOT Vmax:   95.80 cm/s LVOT Vmean:  66.100 cm/s LVOT VTI:    0.239 m  AORTA Ao Root diam: 2.80 cm Ao Asc diam:  3.10 cm MITRAL VALVE MV Area (PHT): 4.21 cm     SHUNTS MV Decel Time: 180 msec     Systemic VTI:  0.24 m MV E velocity: 123.00 cm/s  Systemic Diam: 1.50 cm MV A velocity: 88.60 cm/s MV E/A ratio:  1.39 Charlton Haws MD Electronically signed by Charlton Haws MD Signature Date/Time: 03/17/2020/4:41:43 PM    Final     Microbiology: Recent Results (from the past 240 hour(s))  SARS Coronavirus 2 by RT PCR (hospital order, performed in [facility] hospital lab) Nasopharyngeal Nasopharyngeal Swab     Status: None   Collection Time: 03/16/20 11:41 PM   Specimen: Nasopharyngeal Swab  Result Value Ref Range Status   SARS Coronavirus 2 NEGATIVE NEGATIVE Final    Comment: (NOTE) SARS-CoV-2 target nucleic acids are NOT DETECTED.  The SARS-CoV-2 RNA is generally detectable in upper and lower respiratory specimens during the acute phase of infection. The lowest concentration of SARS-CoV-2 viral copies this assay can detect is 250 copies / mL. A negative result does not preclude SARS-CoV-2 infection and should not be used as the sole basis for treatment or other patient management decisions.  A negative result may occur with improper specimen collection / handling, submission of specimen other than nasopharyngeal swab, presence of viral mutation(s) within the areas targeted by this assay, and inadequate number of viral copies (<250 copies / mL). A negative result must be combined with clinical observations, patient history, and epidemiological information.  Fact Sheet for  Patients:   BoilerBrush.com.cy  Fact Sheet for Healthcare Providers: https://pope.com/  This test is not yet approved or  cleared by the [place] FDA and has been authorized for detection and/or diagnosis of SARS-CoV-2 by FDA under an Emergency Use  Authorization (EUA).  This EUA will remain in effect (meaning this test can be used) for the duration of the COVID-19 declaration under Section 564(b)(1) of the Act, 21 U.S.C. section 360bbb-3(b)(1), unless the authorization is terminated or revoked sooner.  Performed at [facility], [address], [place], [place] [address]   SARS CORONAVIRUS 2 (TAT 6-24 HRS) Nasopharyngeal Nasopharyngeal Swab     Status: None   Collection Time: 03/22/20  8:40 AM   Specimen: Nasopharyngeal Swab  Result Value Ref Range Status   SARS Coronavirus 2 NEGATIVE NEGATIVE Final    Comment: (NOTE) SARS-CoV-2 target nucleic acids are NOT DETECTED.  The SARS-CoV-2 RNA is generally detectable in upper and lower respiratory specimens during the acute phase of infection. Negative results do not preclude SARS-CoV-2 infection, do not rule out co-infections with other pathogens, and should not be used as the sole basis for treatment or other patient management decisions. Negative results must be combined with clinical observations, patient history, and epidemiological information. The expected result is Negative.  Fact Sheet for Patients: HairSlick.no  Fact Sheet for Healthcare Providers: quierodirigir.com  This test is not yet approved or cleared by the [place] FDA and  has been authorized for detection and/or diagnosis of SARS-CoV-2 by FDA under an Emergency Use Authorization (EUA). This EUA will remain  in effect (meaning this test can be used) for the duration of the COVID-19 declaration under Se ction 564(b)(1) of the Act, 21 U.S.C. section 360bbb-3(b)(1), unless the authorization is terminated or revoked sooner.  Performed at [facility], [address] [place], [place] [address]      Labs: Basic Metabolic Panel: Recent Labs  Lab 03/18/20 0252 03/19/20 0521 03/20/20 0442 03/21/20 0210 03/22/20 0353  NA 139 138 137  135 138  K 4.1 4.0 4.1 4.0 4.0  CL 108 106 105 103 102  CO2 GLUCOSE 91 91 96 94 89  BUN 11 8 6* 9 11  CREATININE 0.89 0.84 0.90 0.89 0.96  CALCIUM 8.2* 8.7* 8.6* 8.7* 9.1  MG 2.0 2.2 2.2 2.1 2.1  PHOS 2.8 3.2 3.3 3.5 4.0   Liver Function Tests: Recent Labs  Lab 03/18/20 0252 03/19/20 0521 03/20/20 0442 03/21/20 0210 03/22/20 0353  AST 25 25 40 44* 48*  ALT 35 45*  ALKPHOS 59 61 65 68 67  BILITOT 0.7 0.7 0.4 0.5 0.7  PROT 5.2* 5.7* 5.9* 5.7* 5.9*  ALBUMIN 3.1* 3.2* 3.3* 3.2* 3.4*   No results for input(s): LIPASE, AMYLASE in the last 168 hours. No results for input(s): AMMONIA in the last 168 hours. CBC: Recent Labs  Lab 03/19/20 0521 03/20/20 0442 03/21/20 0210 03/22/20 0353 03/23/20 0628  WBC 4.0 4.0 4.0 3.8* 4.2  NEUTROABS 2.3 2.3 2.3 2.0 2.4  HGB 11.1* 11.4* 11.4* 11.6* 12.1  HCT 33.2* 35.4* 34.3* 34.8* 37.0  MCV 91.5 93.2 92.0 91.1 93.0  PLT 118* 115* 121* 121* 132*   Cardiac Enzymes: No results for input(s): CKTOTAL, CKMB, CKMBINDEX, TROPONINI in the last 168 hours. BNP: BNP (last 3 results) No results for input(s): BNP in the last 8760 hours.  ProBNP (last 3 results) No results for input(s): PROBNP in the last  8760 hours.  CBG: Recent Labs  Lab 03/16/20 2008  GLUCAP 85       Signed:  Carolyne Littles, MD [facility] [phone] pager

## 2020-03-23 NOTE — TOC Progression Note (Addendum)
Transition of Care [facility]) - Progression Note    Patient Details  Name: Kristin Rush MRN: [id number] Date of Birth: November 22, 1940  [facility] CM/SW Contact  Levada Schilling Phone Number: 03/23/2020, 11:32 AM  Clinical Narrative:    CSW has not been able to contact spouse or other family members to complete paperwork at the facility.  [name], admission coordinator will check to see if pt can sign paperwork for herself.  11:37am: SNF will need family to sign paperwork for pt.Facility want to be sure pt has support. Pt can not go to facility without family filling out paperwork.   TOC Team will continue to assist with discharge planning.. 12:23pm  [name] [name] [phone] contacted CSW.  [name] and pt's spouse will go to [facility] to complete paperwork for disposition.  Facility will contact CSW when paperwork has been completed.  Expected Discharge Plan: Skilled Nursing Facility Barriers to Discharge: Continued Medical Work up  Expected Discharge Plan and Services Expected Discharge Plan: Skilled Nursing Facility         Expected Discharge Date: 03/23/20                                     Social Determinants of Health (SDOH) Interventions    Readmission Risk Interventions No flowsheet data found.

## 2020-03-23 NOTE — TOC Progression Note (Addendum)
Transition of Care [facility]) - Progression Note    Patient Details  Name: Kristin Rush MRN: [id number] Date of Birth: 10-11-1940  Transition of Care [facility]) CM/SW Contact  Levada Schilling Phone Number: 03/23/2020, 2:19 PM  Clinical Narrative:    Patient will Discharge To: [facility]) Anticipated DC Date:03/23/20 Family Notified:yes, Tacey Ruiz, [phone] Transport By: Sharin Mons   Per MD patient ready for DC to [facility] . RN, patient, patient's family, and facility notified of DC. Assessment, Fl2/Pasrr, and Discharge Summary sent to facility. RN given number for report [phone], Room 32.). DC packet on chart. Ambulance transport requested for patient for 3:30pm  CSW signing off.  Budd Palmer LCSWA [phone]     Expected Discharge Plan: Skilled Nursing Facility Barriers to Discharge: No Barriers Identified  Expected Discharge Plan and Services Expected Discharge Plan: Skilled Nursing Facility         Expected Discharge Date: 03/23/20                                     Social Determinants of Health (SDOH) Interventions    Readmission Risk Interventions No flowsheet data found.

## 2020-03-23 NOTE — Progress Notes (Signed)
[facility] here to transport pt to SNF.

## 2020-03-25 ENCOUNTER — Emergency Department (HOSPITAL_COMMUNITY)
Admission: EM | Admit: 2020-03-25 | Discharge: 2020-03-25 | Disposition: A | Discharge status: Home / Self Care | Payer: Medicare Other | Attending: Emergency Medicine | Admitting: Emergency Medicine

## 2020-03-25 ENCOUNTER — Other Ambulatory Visit: Payer: Self-pay

## 2020-03-25 DIAGNOSIS — I951 Orthostatic hypotension: Secondary | ICD-10-CM | POA: Insufficient documentation

## 2020-03-25 DIAGNOSIS — R55 Syncope and collapse: Secondary | ICD-10-CM | POA: Diagnosis present

## 2020-03-25 LAB — CBC WITH DIFFERENTIAL/PLATELET
Abs Immature Granulocytes: 0.02 10*3/uL (ref 0.00–0.07)
Basophils Absolute: 0 10*3/uL (ref 0.0–0.1)
Basophils Relative: 0 %
Eosinophils Absolute: 0.1 10*3/uL (ref 0.0–0.5)
Eosinophils Relative: 2 %
HCT: 32.5 % — ABNORMAL LOW (ref 36.0–46.0)
Hemoglobin: 10.4 g/dL — ABNORMAL LOW (ref 12.0–15.0)
Immature Granulocytes: 0 %
Lymphocytes Relative: 18 %
Lymphs Abs: 1 10*3/uL (ref 0.7–4.0)
MCH: 30.8 pg (ref 26.0–34.0)
MCHC: 32 g/dL (ref 30.0–36.0)
MCV: 96.2 fL (ref 80.0–100.0)
Monocytes Absolute: 0.6 10*3/uL (ref 0.1–1.0)
Monocytes Relative: 10 %
Neutro Abs: 3.9 10*3/uL (ref 1.7–7.7)
Neutrophils Relative %: 70 %
Platelets: 114 10*3/uL — ABNORMAL LOW (ref 150–400)
RBC: 3.38 MIL/uL — ABNORMAL LOW (ref 3.87–5.11)
RDW: 13.1 % (ref 11.5–15.5)
WBC: 5.7 10*3/uL (ref 4.0–10.5)
nRBC: 0 % (ref 0.0–0.2)

## 2020-03-25 LAB — BASIC METABOLIC PANEL
Anion gap: 9 (ref 5–15)
BUN: 14 mg/dL (ref 8–23)
CO2: 23 mmol/L (ref 22–32)
Calcium: 8 mg/dL — ABNORMAL LOW (ref 8.9–10.3)
Chloride: 104 mmol/L (ref 98–111)
Creatinine, Ser: 0.9 mg/dL (ref 0.44–1.00)
GFR calc Af Amer: >60 mL/min (ref >60)
GFR calc non Af Amer: >60 mL/min (ref >60)
Glucose, Bld: 90 mg/dL (ref 70–99)
Potassium: 3.8 mmol/L (ref 3.5–5.1)
Sodium: 136 mmol/L (ref 135–145)

## 2020-03-25 MED ORDER — SODIUM CHLORIDE 0.9 % IV SOLN: 1000.0000 mL | INTRAVENOUS | Status: DC

## 2020-03-25 MED ORDER — SODIUM CHLORIDE 0.9 % IV BOLUS (SEPSIS)
1000.0000 mL | Freq: Once | INTRAVENOUS | Status: AC
  Administered 2020-03-25: 1000 mL via INTRAVENOUS

## 2020-03-25 NOTE — ED Provider Notes (Addendum)
[facility]-EMERGENCY DEPT Provider Note  CSN: [id number] Arrival date & time: 03/25/20 0108  Chief Complaint(s) No chief complaint on file.  HPI Kristin Rush is a 79 y.o. female with a past medical history listed below including orthostasis who presents to the emergency department for syncope.  Patient reports getting up to go to the restroom.  On her way there she lost consciousness and fell.  She denied any preceding dizziness, chest pain, shortness of breath.  Patient actually reports not remembering passing out.  Staff at the facility called EMS.  Patient is currently denying any physical complaints.  She is not anticoagulated.  HPI  Past Medical History Past Medical History:  Diagnosis Date  . 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 Lumbar spondylosis 02/18/2018  . Migraine   . Migraines 10/09/2011  . Orthostatic hypotension 10/23/2016  . Osteoporosis 04/16/2014  . Primary osteoarthritis of both hands 07/11/2017   Patient Active Problem List   Diagnosis Date Noted  . 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 Frequent falls 03/17/2020  . Postural dizziness with presyncope 03/17/2020  . Essential hypertension 03/17/2020  . Right ear pain 03/17/2020  . Excessive cerumen in both ear canals 03/17/2020  . Syncope and collapse 03/17/2020  . 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 Lumbar spondylosis 02/18/2018  . Primary osteoarthritis of both hands 07/11/2017  . Orthostatic hypotension 10/23/2016  . Osteoporosis 04/16/2014  . Migraines 10/09/2011   Home Medication(s) Prior to Admission medications   Medication Sig Start Date End Date Taking? Authorizing Provider  acetaminophen (TYLENOL) 500 MG tablet Take 500 mg by mouth daily as needed for mild pain.    Yes [provider]  calcium-vitamin D (OSCAL WITH D) 500-200 MG-UNIT per tablet Take 1 tablet by mouth daily.   Yes [provider]  cyanocobalamin 1000 MCG tablet Take 1,000 mcg by mouth daily. 04/19/18  Yes [provider]  escitalopram (LEXAPRO) 10 MG tablet Take 10 mg by mouth daily.   Yes [provider]  ibuprofen (ADVIL,MOTRIN) 200 MG tablet Take 600 mg by mouth daily as needed for moderate pain. For pain     Yes [provider]  midodrine (PROAMATINE) 2.5 MG tablet Take 3 tablets (7.5 mg total) by mouth 2 (two) times daily with breakfast and lunch. 03/23/20  Yes Drema Dallas, MD  midodrine (PROAMATINE) 5 MG tablet Take 1 tablet (5 mg total) by mouth at bedtime. 03/23/20  Yes Drema Dallas, MD  Multiple Vitamins-Minerals (PRESERVISION AREDS) TABS Take 1 tablet by mouth daily.   Yes [provider]  ondansetron (ZOFRAN) 4 MG tablet Take 1 tablet (4 mg total) by mouth every 6 (six) hours as needed for nausea. 03/23/20  Yes Drema Dallas, MD  polyethylene glycol (MIRALAX / GLYCOLAX) 17 g packet Take 17 g by mouth daily as needed for mild constipation. 03/23/20  Yes Drema Dallas, MD  Past Surgical History No past surgical history on file. Family History No family history on file.  Social History Social History   Tobacco Use  . Smoking status: Never Smoker  . Smokeless tobacco: Never Used  Substance Use Topics  . Alcohol use: No  . Drug use: No   Allergies Codeine and Sulfa antibiotics  Review of Systems Review of Systems All other systems are reviewed and are negative for acute change except as noted in the HPI  Physical Exam Vital Signs  I have reviewed the triage vital signs BP (!) 169/73   Pulse 76   Temp 97.7 F (36.5 C)   Resp 13   SpO2 96%   Physical Exam Constitutional:      General: She is not in acute distress.    Appearance: She is well-developed. She is not diaphoretic.  HENT:     Head: Normocephalic  and atraumatic.     Right Ear: External ear normal.     Left Ear: External ear normal.     Nose: Nose normal.  Eyes:     General: No scleral icterus.       Right eye: No discharge.        Left eye: No discharge.     Conjunctiva/sclera: Conjunctivae normal.     Pupils: Pupils are equal, round, and reactive to light.  Cardiovascular:     Rate and Rhythm: Normal rate and regular rhythm.     Pulses:          Radial pulses are 2+ on the right side and 2+ on the left side.       Dorsalis pedis pulses are 2+ on the right side and 2+ on the left side.     Heart sounds: Normal heart sounds. No murmur heard.  No friction rub. No gallop.   Pulmonary:     Effort: Pulmonary effort is normal. No respiratory distress.     Breath sounds: Normal breath sounds. No stridor. No wheezing.  Abdominal:     General: There is no distension.     Palpations: Abdomen is soft.     Tenderness: There is no abdominal tenderness.  Musculoskeletal:        General: No tenderness.     Cervical back: Normal range of motion and neck supple. No bony tenderness.     Thoracic back: No bony tenderness.     Lumbar back: No bony tenderness.     Comments: Clavicles stable. Chest stable to AP/Lat compression. Pelvis stable to Lat compression. No obvious extremity deformity. No chest or abdominal wall contusion.  Skin:    General: Skin is warm and dry.     Findings: No erythema or rash.  Neurological:     Mental Status: She is alert and oriented to person, place, and time.     Comments: Moving all extremities     ED Results and Treatments Labs (all labs ordered are listed, but only abnormal results are displayed) Labs Reviewed  CBC WITH DIFFERENTIAL/PLATELET - Abnormal; Notable for the following components:      Result Value   RBC 3.38 (*)    Hemoglobin 10.4 (*)    HCT 32.5 (*)    Platelets 114 (*)    All other components within normal limits  BASIC METABOLIC PANEL - Abnormal; Notable for the following  components:   Calcium 8.0 (*)    All other components within normal limits  EKG  EKG Interpretation  Date/Time:  Thursday March 25 2020 03:55:16 EDT Ventricular Rate:  66 PR Interval:    QRS Duration: 76 QT Interval:  399 QTC Calculation: 418 R Axis:   81 Text Interpretation: Sinus rhythm Borderline right axis deviation Minimal ST depression, diffuse leads No significant change since last tracing Confirmed by Drema Pry [phone]) on 03/25/2020 4:11:13 AM      Radiology No results found.  Pertinent labs & imaging results that were available during my care of the patient were reviewed by me and considered in my medical decision making (see chart for details).  Medications Ordered in ED Medications  sodium chloride 0.9 % bolus 1,000 mL (0 mLs Intravenous Stopped 03/25/20 0700)    Followed by  0.9 %  sodium chloride infusion (has no administration in time range)                                                                                                                                    Procedures Procedures  (including critical care time)  Medical Decision Making / ED Course I have reviewed the nursing notes for this encounter and the patient's prior records (if available in EHR or on provided paperwork).   Kristin Rush was evaluated in Emergency Department on 03/25/2020 for the symptoms described in the history of present illness. She was evaluated in the context of the global COVID-19 pandemic, which necessitated consideration that the patient might be at risk for infection with the SARS-CoV-2 virus that causes COVID-19. Institutional protocols and algorithms that pertain to the evaluation of patients at risk for COVID-19 are in a state of rapid change based on information released by regulatory bodies including the CDC and federal and state organizations.  These policies and algorithms were followed during the patient's care in the ED.  Confirm orthostasis.  And patient was symptomatic.  EKG without any significant changes.  Screening labs reassuring.  Patient provided with IV fluids.  Upon reassessment, patient was able to stand and ambulate without complication.  No injuries noted on exam requiring imaging.      Final Clinical Impression(s) / ED Diagnoses Final diagnoses:  Orthostasis   The patient appears reasonably screened and/or stabilized for discharge and I doubt any other medical condition or other [facility] requiring further screening, evaluation, or treatment in the ED at this time prior to discharge. Safe for discharge with strict return precautions.  Disposition: Discharge  Condition: Good  I have discussed the results, Dx and Tx plan with the patient/family who expressed understanding and agree(s) with the plan. Discharge instructions discussed at length. The patient/family was given strict return precautions who verbalized understanding of the instructions. No further questions at time of discharge.    ED Discharge Orders    None         Follow Up: Eartha Inch, MD [address] [place] [address] [phone]  Schedule  an appointment as soon as possible for a visit  As needed      This chart was dictated using voice recognition software.  Despite best efforts to proofread,  errors can occur which can change the documentation meaning.   Nira Connardama, Heavan Francom Eduardo, MD 03/25/20 [id number]    Nira Connardama, Geriann Lafont Eduardo, MD 04/22/20 [phone]

## 2020-03-25 NOTE — ED Notes (Signed)
Pt ambulated to restroom w/ walker.  Utilizes a walker at home.  Sts she feels a little better than when she came in.

## 2020-03-25 NOTE — ED Triage Notes (Addendum)
Pt BIB EMS from [facility] c/o unwitnessed fall. EMS reports staff found pt on the floor. Pt attempted to go to the bathroom and had LOC. No injuries upon assessment. Hx of orthostatic hypotension. Not on blood thinners. Denies dizziness, N/V, headache. A&O x 4.

## 2020-03-30 ENCOUNTER — Telehealth: Payer: Self-pay

## 2020-03-30 NOTE — Telephone Encounter (Signed)
[facility] ON FILE FROM [facility] [phone], SENT REFERRAL TO SCHEDULING

## 2020-04-03 ENCOUNTER — Emergency Department (HOSPITAL_COMMUNITY)
Admission: EM | Admit: 2020-04-03 | Discharge: 2020-04-03 | Disposition: A | Discharge status: Home / Self Care | Payer: Medicare Other | Attending: Emergency Medicine | Admitting: Emergency Medicine

## 2020-04-03 ENCOUNTER — Emergency Department (HOSPITAL_COMMUNITY): Payer: Medicare Other

## 2020-04-03 ENCOUNTER — Other Ambulatory Visit: Payer: Self-pay

## 2020-04-03 DIAGNOSIS — I10 Essential (primary) hypertension: Secondary | ICD-10-CM | POA: Diagnosis not present

## 2020-04-03 DIAGNOSIS — I951 Orthostatic hypotension: Secondary | ICD-10-CM | POA: Diagnosis not present

## 2020-04-03 DIAGNOSIS — Z8673 Personal history of transient ischemic attack (TIA), and cerebral infarction without residual deficits: Secondary | ICD-10-CM | POA: Insufficient documentation

## 2020-04-03 DIAGNOSIS — Z79899 Other long term (current) drug therapy: Secondary | ICD-10-CM | POA: Diagnosis not present

## 2020-04-03 DIAGNOSIS — I6782 Cerebral ischemia: Secondary | ICD-10-CM | POA: Insufficient documentation

## 2020-04-03 DIAGNOSIS — R9082 White matter disease, unspecified: Secondary | ICD-10-CM | POA: Diagnosis not present

## 2020-04-03 DIAGNOSIS — R55 Syncope and collapse: Secondary | ICD-10-CM

## 2020-04-03 LAB — CBC
HCT: 38.6 % (ref 36.0–46.0)
Hemoglobin: 12.1 g/dL (ref 12.0–15.0)
MCH: 29.6 pg (ref 26.0–34.0)
MCHC: 31.3 g/dL (ref 30.0–36.0)
MCV: 94.4 fL (ref 80.0–100.0)
Platelets: 172 10*3/uL (ref 150–400)
RBC: 4.09 MIL/uL (ref 3.87–5.11)
RDW: 13.2 % (ref 11.5–15.5)
WBC: 4.2 10*3/uL (ref 4.0–10.5)
nRBC: 0 % (ref 0.0–0.2)

## 2020-04-03 LAB — BASIC METABOLIC PANEL
Anion gap: 9 (ref 5–15)
BUN: 11 mg/dL (ref 8–23)
CO2: 28 mmol/L (ref 22–32)
Calcium: 9.3 mg/dL (ref 8.9–10.3)
Chloride: 101 mmol/L (ref 98–111)
Creatinine, Ser: 0.97 mg/dL (ref 0.44–1.00)
GFR calc Af Amer: >60 mL/min (ref >60)
GFR calc non Af Amer: 56 mL/min — ABNORMAL LOW (ref >60)
Glucose, Bld: 105 mg/dL — ABNORMAL HIGH (ref 70–99)
Potassium: 4.2 mmol/L (ref 3.5–5.1)
Sodium: 138 mmol/L (ref 135–145)

## 2020-04-03 LAB — URINALYSIS, ROUTINE W REFLEX MICROSCOPIC
Bilirubin Urine: NEGATIVE
Glucose, UA: NEGATIVE mg/dL
Hgb urine dipstick: NEGATIVE
Ketones, ur: NEGATIVE mg/dL
Nitrite: NEGATIVE
Protein, ur: NEGATIVE mg/dL
Specific Gravity, Urine: 1.016 (ref 1.005–1.030)
pH: 5 (ref 5.0–8.0)

## 2020-04-03 LAB — CBG MONITORING, ED: Glucose-Capillary: 77 mg/dL (ref 70–99)

## 2020-04-03 NOTE — ED Notes (Signed)
Patient transported to MRI 

## 2020-04-03 NOTE — ED Triage Notes (Signed)
Pt here from [facility] for eval of recurrent syncope this morning but did not fall. Per staff multiple episodes of near syncope and full syncopal episodes over the last four weeks. Per pt's sister, pt fully A/O x 4 but is at the facility for rehab, but cognitive communication deficit is listed in pt's paperwork. Disoriented to time in triage, answers other questions appropriately.

## 2020-04-03 NOTE — ED Notes (Signed)
Pt was able to sit up and walker to wheelchair. Denies Dizziness.

## 2020-04-03 NOTE — ED Provider Notes (Signed)
[facility] EMERGENCY DEPARTMENT Provider Note   CSN: [id number] Arrival date & time: 04/03/20  4098     History No chief complaint on file.   Kristin Rush is a 79 y.o. female.  HPI   79 year old female presenting for near syncope.  She is coming from [place].  She has had numerous syncopal and near syncopal episodes of the last several weeks.  She has been evaluated including admission for the same.  Severe orthostatic hypotension.  She is currently on midodrine.  Patient sister at bedside.  She does not feel that symptoms have improved significantly.  Patient denies any acute pain.  No symptoms at rest.  They are predictably reproducible with changes in position.  Past Medical History:  Diagnosis Date  . 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 Lumbar spondylosis 02/18/2018  . Migraine   . Migraines 10/09/2011  . Orthostatic hypotension 10/23/2016  . Osteoporosis 04/16/2014  . Primary osteoarthritis of both hands 07/11/2017    Patient Active Problem List   Diagnosis Date Noted  . 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 Frequent falls 03/17/2020  . Postural dizziness with presyncope 03/17/2020  . Essential hypertension 03/17/2020  . Right ear pain 03/17/2020  . Excessive cerumen in both ear canals 03/17/2020  . Syncope and collapse 03/17/2020  . 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 Lumbar spondylosis 02/18/2018  . Primary osteoarthritis of both hands 07/11/2017  . Orthostatic hypotension 10/23/2016  . Osteoporosis 04/16/2014  . Migraines 10/09/2011   No past surgical history on file.   OB History   No obstetric history on file.    No family history on file.  Social History   Tobacco Use  . Smoking status: Never Smoker  . Smokeless tobacco: Never Used  Substance Use Topics  . Alcohol use: No  . Drug use: No    Home Medications Prior to Admission medications     Medication Sig Start Date End Date Taking? Authorizing Provider  acetaminophen (TYLENOL) 500 MG tablet Take 500 mg by mouth daily as needed for mild pain.     [provider]  calcium-vitamin D (OSCAL WITH D) 500-200 MG-UNIT per tablet Take 1 tablet by mouth daily.    [provider]  cyanocobalamin 1000 MCG tablet Take 1,000 mcg by mouth daily. 04/19/18   [provider]  escitalopram (LEXAPRO) 10 MG tablet Take 10 mg by mouth daily.    [provider]  ibuprofen (ADVIL,MOTRIN) 200 MG tablet Take 600 mg by mouth daily as needed for moderate pain. For pain      [provider]  midodrine (PROAMATINE) 2.5 MG tablet Take 3 tablets (7.5 mg total) by mouth 2 (two) times daily with breakfast and lunch. 03/23/20   Drema Dallas, MD  midodrine (PROAMATINE) 5 MG tablet Take 1 tablet (5 mg total) by mouth at bedtime. 03/23/20   Drema Dallas, MD  Multiple Vitamins-Minerals (PRESERVISION AREDS) TABS Take 1 tablet by mouth daily.    [provider]  ondansetron (ZOFRAN) 4 MG tablet Take 1 tablet (4 mg total) by mouth every 6 (six) hours as needed for nausea. 03/23/20   Drema Dallas, MD  polyethylene glycol (MIRALAX / GLYCOLAX) 17 g packet Take 17 g by mouth daily as needed for mild constipation. 03/23/20   Drema Dallas, MD    Allergies    Codeine and Sulfa antibiotics  Review of Systems   Review of  Systems All systems reviewed and negative, other than as noted in HPI.  Physical Exam Updated Vital Signs BP 125/74 (BP Location: Left Arm)   Pulse 88   Temp 97.9 F (36.6 C) (Oral)   Resp 16   SpO2 97%   Physical Exam Vitals and nursing note reviewed.  Constitutional:      General: She is not in acute distress.    Appearance: She is well-developed.  HENT:     Head: Normocephalic and atraumatic.  Eyes:     General:        Right eye: No discharge.        Left eye: No discharge.     Conjunctiva/sclera: Conjunctivae normal.   Cardiovascular:     Rate and Rhythm: Normal rate and regular rhythm.     Heart sounds: Normal heart sounds. No murmur heard.  No friction rub. No gallop.   Pulmonary:     Effort: Pulmonary effort is normal. No respiratory distress.     Breath sounds: Normal breath sounds.  Abdominal:     General: There is no distension.     Palpations: Abdomen is soft.     Tenderness: There is no abdominal tenderness.  Musculoskeletal:        General: No tenderness.     Cervical back: Neck supple.  Skin:    General: Skin is warm and dry.  Neurological:     Mental Status: She is alert.  Psychiatric:        Behavior: Behavior normal.        Thought Content: Thought content normal.     ED Results / Procedures / Treatments   Labs (all labs ordered are listed, but only abnormal results are displayed) Labs Reviewed  BASIC METABOLIC PANEL - Abnormal; Notable for the following components:      Result Value   Glucose, Bld 105 (*)    GFR calc non Af Amer 56 (*)    All other components within normal limits  CBC  URINALYSIS, ROUTINE W REFLEX MICROSCOPIC  CBG MONITORING, ED    EKG EKG Interpretation  Date/Time:  Saturday April 03 2020 08:22:09 EDT Ventricular Rate:  74 PR Interval:  126 QRS Duration: 78 QT Interval:  398 QTC Calculation: 441 R Axis:   52 Text Interpretation: Normal sinus rhythm Nonspecific ST and T wave abnormality Abnormal ECG Confirmed by Raeford Razor [phone] on 04/03/2020 5:53:48 PM   Radiology No results found.   CT Head Wo Contrast  Result Date: 03/16/2020 CLINICAL DATA:  Ataxia, stroke suspected falls, bilateral leg weakness EXAM: CT HEAD WITHOUT CONTRAST TECHNIQUE: Contiguous axial images were obtained from the base of the skull through the vertex without intravenous contrast. COMPARISON:  Most recent head CT 10/03/2011 FINDINGS: Brain: No hemorrhage or evidence of acute ischemia. There is a remote lacunar infarct in the right basal ganglia. Mild generalized atrophy  and chronic small vessel ischemia. Dilatation of the lateral ventricles is greater than the degree of sulcal prominence. No extra-axial or subdural collection. Punctate left basal ganglia calcification is likely senescent. Vascular: Atherosclerosis of skullbase vasculature without hyperdense vessel or abnormal calcification. Skull: No fracture. Small midline frontal skull osteoma. No suspicious lesion. Sinuses/Orbits: Paranasal sinuses and mastoid air cells are clear. The visualized orbits are unremarkable. Other: None. IMPRESSION: 1. No acute intracranial abnormality. 2. Dilatation of the lateral ventricles is greater than the degree of sulcal prominence, can be seen with normal pressure hydrocephalus in the appropriate clinical setting. 3. Mild chronic small vessel ischemia.  Remote lacunar infarct in the right basal ganglia. Electronically Signed   By: Narda Rutherford M.D.   On: 03/16/2020 21:55   MR ANGIO HEAD WO CONTRAST  Result Date: 04/03/2020 CLINICAL DATA:  Dizziness.  Recurrent syncope.  Confusion. EXAM: MRI HEAD WITHOUT CONTRAST MRA HEAD WITHOUT CONTRAST MRA NECK WITHOUT CONTRAST TECHNIQUE: Multiplanar, multiecho pulse sequences of the brain and surrounding structures were obtained without intravenous contrast. Angiographic images of the Circle of Willis were obtained using MRA technique without intravenous contrast. Angiographic images of the neck were obtained using MRA technique without intravenous contrast. Carotid stenosis measurements (when applicable) are obtained utilizing NASCET criteria, using the distal internal carotid diameter as the denominator. COMPARISON:  CT head 03/16/2020 FINDINGS: MRI HEAD FINDINGS Brain: Mild generalized atrophy is present. Ventricles are dilated. A remote lacunar infarct is present in right thalamus. Diffusion-weighted images demonstrate no acute infarction. Moderate periventricular T2 signal changes are noted bilaterally. No significant extraaxial fluid  collection is present. The internal auditory canals are within normal limits. The brainstem and cerebellum are within normal limits. Vascular: Flow is present in the major intracranial arteries. Skull and upper cervical spine: The craniocervical junction is normal. Upper cervical spine is within normal limits. Marrow signal is unremarkable. Sinuses/Orbits: The paranasal sinuses and mastoid air cells are clear. The globes and orbits are within normal limits. MRA HEAD FINDINGS The internal carotid arteries are within normal limits from the high cervical segments through the ICA termini bilaterally. The A1 and M1 segments are normal. The anterior communicating artery is patent. ACA and MCA branch vessels are normal. The right vertebral artery is the dominant vessel. The left vertebral artery is hypoplastic and essentially terminates at the PICA. A small V4 segment is present. The basilar artery is small. Fetal type posterior cerebral arteries are present bilaterally. Small P1 segment is present on the left. Distal PCA branch vessels are attenuated bilaterally. MRA NECK FINDINGS Time-of-flight images demonstrate no significant flow disturbance at either carotid bifurcation. Flow is antegrade in the vertebral arteries bilaterally. Common origin of the left common carotid artery and innominate artery is noted. No significant stenosis is present in either carotid bifurcation. The vertebral arteries originate from the subclavian arteries bilaterally without significant stenosis. IMPRESSION: 1. No acute intracranial abnormality. 2. Atrophy and white matter disease likely reflects the sequela of chronic microvascular ischemia. 3. Remote lacunar infarct of the right thalamus. 4. Moderate small vessel disease in both the anterior and posterior circulation. 5. No significant proximal stenosis, aneurysm, or branch vessel occlusion within the Circle of Willis. 6. No significant stenosis in the neck. Electronically Signed   By:  Marin Roberts M.D.   On: 04/03/2020 22:53   MR ANGIO NECK WO CONTRAST  Result Date: 04/03/2020 CLINICAL DATA:  Dizziness.  Recurrent syncope.  Confusion. EXAM: MRI HEAD WITHOUT CONTRAST MRA HEAD WITHOUT CONTRAST MRA NECK WITHOUT CONTRAST TECHNIQUE: Multiplanar, multiecho pulse sequences of the brain and surrounding structures were obtained without intravenous contrast. Angiographic images of the Circle of Willis were obtained using MRA technique without intravenous contrast. Angiographic images of the neck were obtained using MRA technique without intravenous contrast. Carotid stenosis measurements (when applicable) are obtained utilizing NASCET criteria, using the distal internal carotid diameter as the denominator. COMPARISON:  CT head 03/16/2020 FINDINGS: MRI HEAD FINDINGS Brain: Mild generalized atrophy is present. Ventricles are dilated. A remote lacunar infarct is present in right thalamus. Diffusion-weighted images demonstrate no acute infarction. Moderate periventricular T2 signal changes are noted bilaterally. No significant extraaxial fluid  collection is present. The internal auditory canals are within normal limits. The brainstem and cerebellum are within normal limits. Vascular: Flow is present in the major intracranial arteries. Skull and upper cervical spine: The craniocervical junction is normal. Upper cervical spine is within normal limits. Marrow signal is unremarkable. Sinuses/Orbits: The paranasal sinuses and mastoid air cells are clear. The globes and orbits are within normal limits. MRA HEAD FINDINGS The internal carotid arteries are within normal limits from the high cervical segments through the ICA termini bilaterally. The A1 and M1 segments are normal. The anterior communicating artery is patent. ACA and MCA branch vessels are normal. The right vertebral artery is the dominant vessel. The left vertebral artery is hypoplastic and essentially terminates at the PICA. A small V4  segment is present. The basilar artery is small. Fetal type posterior cerebral arteries are present bilaterally. Small P1 segment is present on the left. Distal PCA branch vessels are attenuated bilaterally. MRA NECK FINDINGS Time-of-flight images demonstrate no significant flow disturbance at either carotid bifurcation. Flow is antegrade in the vertebral arteries bilaterally. Common origin of the left common carotid artery and innominate artery is noted. No significant stenosis is present in either carotid bifurcation. The vertebral arteries originate from the subclavian arteries bilaterally without significant stenosis. IMPRESSION: 1. No acute intracranial abnormality. 2. Atrophy and white matter disease likely reflects the sequela of chronic microvascular ischemia. 3. Remote lacunar infarct of the right thalamus. 4. Moderate small vessel disease in both the anterior and posterior circulation. 5. No significant proximal stenosis, aneurysm, or branch vessel occlusion within the Circle of Willis. 6. No significant stenosis in the neck. Electronically Signed   By: Marin Robertshristopher  Mattern M.D.   On: 04/03/2020 22:53   MR BRAIN WO CONTRAST  Result Date: 04/03/2020 CLINICAL DATA:  Dizziness.  Recurrent syncope.  Confusion. EXAM: MRI HEAD WITHOUT CONTRAST MRA HEAD WITHOUT CONTRAST MRA NECK WITHOUT CONTRAST TECHNIQUE: Multiplanar, multiecho pulse sequences of the brain and surrounding structures were obtained without intravenous contrast. Angiographic images of the Circle of Willis were obtained using MRA technique without intravenous contrast. Angiographic images of the neck were obtained using MRA technique without intravenous contrast. Carotid stenosis measurements (when applicable) are obtained utilizing NASCET criteria, using the distal internal carotid diameter as the denominator. COMPARISON:  CT head 03/16/2020 FINDINGS: MRI HEAD FINDINGS Brain: Mild generalized atrophy is present. Ventricles are dilated. A remote  lacunar infarct is present in right thalamus. Diffusion-weighted images demonstrate no acute infarction. Moderate periventricular T2 signal changes are noted bilaterally. No significant extraaxial fluid collection is present. The internal auditory canals are within normal limits. The brainstem and cerebellum are within normal limits. Vascular: Flow is present in the major intracranial arteries. Skull and upper cervical spine: The craniocervical junction is normal. Upper cervical spine is within normal limits. Marrow signal is unremarkable. Sinuses/Orbits: The paranasal sinuses and mastoid air cells are clear. The globes and orbits are within normal limits. MRA HEAD FINDINGS The internal carotid arteries are within normal limits from the high cervical segments through the ICA termini bilaterally. The A1 and M1 segments are normal. The anterior communicating artery is patent. ACA and MCA branch vessels are normal. The right vertebral artery is the dominant vessel. The left vertebral artery is hypoplastic and essentially terminates at the PICA. A small V4 segment is present. The basilar artery is small. Fetal type posterior cerebral arteries are present bilaterally. Small P1 segment is present on the left. Distal PCA branch vessels are attenuated bilaterally. MRA NECK  FINDINGS Time-of-flight images demonstrate no significant flow disturbance at either carotid bifurcation. Flow is antegrade in the vertebral arteries bilaterally. Common origin of the left common carotid artery and innominate artery is noted. No significant stenosis is present in either carotid bifurcation. The vertebral arteries originate from the subclavian arteries bilaterally without significant stenosis. IMPRESSION: 1. No acute intracranial abnormality. 2. Atrophy and white matter disease likely reflects the sequela of chronic microvascular ischemia. 3. Remote lacunar infarct of the right thalamus. 4. Moderate small vessel disease in both the  anterior and posterior circulation. 5. No significant proximal stenosis, aneurysm, or branch vessel occlusion within the Circle of Willis. 6. No significant stenosis in the neck. Electronically Signed   By: Marin Roberts M.D.   On: 04/03/2020 22:53   ECHOCARDIOGRAM COMPLETE  Result Date: 03/17/2020    ECHOCARDIOGRAM REPORT   Patient Name:   Kristin Rush Date of Exam: 03/17/2020 Medical Rec #:  [id number]         Height:       63.0 in Accession #:    [id number]        Weight:       135.0 lb Date of Birth:  [birth year]         BSA:          1.636 m Patient Age:    79 years          BP:           182/76 mmHg Patient Gender: F                 HR:           67 bpm. Exam Location:  Inpatient Procedure: 2D Echo, Cardiac Doppler and Color Doppler Indications:    R55 Syncope  History:        Patient has no prior history of Echocardiogram examinations.                 Orthostatic Hypotension.  Sonographer:    Elmarie Shiley Dance Referring Phys: [id number] Deno Lunger [place] IMPRESSIONS  1. Left ventricular ejection fraction, by estimation, is 60 to 65%. The left ventricle has normal function. The left ventricle has no regional wall motion abnormalities. Left ventricular diastolic parameters are consistent with Grade II diastolic dysfunction (pseudonormalization). Elevated left ventricular end-diastolic pressure.  2. Right ventricular systolic function is normal. The right ventricular size is normal.  3. The mitral valve is normal in structure. Trivial mitral valve regurgitation. No evidence of mitral stenosis.  4. The aortic valve is normal in structure. Aortic valve regurgitation is not visualized. No aortic stenosis is present.  5. The inferior vena cava is normal in size with greater than 50% respiratory variability, suggesting right atrial pressure of 3 mmHg. FINDINGS  Left Ventricle: Left ventricular ejection fraction, by estimation, is 60 to 65%. The left ventricle has normal function. The left ventricle has no  regional wall motion abnormalities. The left ventricular internal cavity size was normal in size. There is  no left ventricular hypertrophy. Left ventricular diastolic parameters are consistent with Grade II diastolic dysfunction (pseudonormalization). Elevated left ventricular end-diastolic pressure. Right Ventricle: The right ventricular size is normal. No increase in right ventricular wall thickness. Right ventricular systolic function is normal. Left Atrium: Left atrial size was normal in size. Right Atrium: Right atrial size was normal in size. Pericardium: There is no evidence of pericardial effusion. Mitral Valve: The mitral valve is normal in structure. Normal mobility of the mitral valve leaflets.  Trivial mitral valve regurgitation. No evidence of mitral valve stenosis. Tricuspid Valve: The tricuspid valve is normal in structure. Tricuspid valve regurgitation is trivial. No evidence of tricuspid stenosis. Aortic Valve: The aortic valve is normal in structure. Aortic valve regurgitation is not visualized. No aortic stenosis is present. Pulmonic Valve: The pulmonic valve was normal in structure. Pulmonic valve regurgitation is not visualized. No evidence of pulmonic stenosis. Aorta: The aortic root is normal in size and structure. Venous: The inferior vena cava is normal in size with greater than 50% respiratory variability, suggesting right atrial pressure of 3 mmHg. IAS/Shunts: No atrial level shunt detected by color flow Doppler.  LEFT VENTRICLE PLAX 2D LVIDd:         3.30 cm  Diastology LVIDs:         2.40 cm  LV e' lateral:   6.53 cm/s LV PW:         1.40 cm  LV E/e' lateral: 18.8 LV IVS:        0.80 cm  LV e' medial:    7.62 cm/s LVOT diam:     1.50 cm  LV E/e' medial:  16.1 LV SV:         42 LV SV Index:   26 LVOT Area:     1.77 cm  RIGHT VENTRICLE             IVC RV Basal diam:  2.20 cm     IVC diam: 1.80 cm RV S prime:     10.00 cm/s TAPSE (M-mode): 2.2 cm LEFT ATRIUM             Index       RIGHT  ATRIUM           Index LA diam:        3.40 cm 2.08 cm/m  RA Area:     10.80 cm LA Vol (A2C):   54.7 ml 33.43 ml/m RA Volume:   22.80 ml  13.93 ml/m LA Vol (A4C):   38.9 ml 23.77 ml/m LA Biplane Vol: 46.8 ml 28.60 ml/m  AORTIC VALVE LVOT Vmax:   95.80 cm/s LVOT Vmean:  66.100 cm/s LVOT VTI:    0.239 m  AORTA Ao Root diam: 2.80 cm Ao Asc diam:  3.10 cm MITRAL VALVE MV Area (PHT): 4.21 cm     SHUNTS MV Decel Time: 180 msec     Systemic VTI:  0.24 m MV E velocity: 123.00 cm/s  Systemic Diam: 1.50 cm MV A velocity: 88.60 cm/s MV E/A ratio:  1.39 Charlton Haws MD Electronically signed by Charlton Haws MD Signature Date/Time: 03/17/2020/4:41:43 PM    Final     Procedures Procedures (including critical care time)  Medications Ordered in ED Medications - No data to display  ED Course  I have reviewed the triage vital signs and the nursing notes.  Pertinent labs & imaging results that were available during my care of the patient were reviewed by me and considered in my medical decision making (see chart for details).    MDM Rules/Calculators/A&P                          79 year old female with recurrent episodes of near syncope and syncope.  Seems most consistent with orthostatic hypotension.  Symptoms reproducible with changes in position.  Neuro exam is nonfocal.  Imaging of her head including angiographic studies without evidence of significant stenosis or other pathology which would clearly explain  her symptoms.  She had an echocardiogram as part of recent work-up.  Advised to stay well-hydrated.  Continue medications.  Follow-up with her PCP or cardiologist for further recommendations.  Final Clinical Impression(s) / ED Diagnoses Final diagnoses:  Syncope, unspecified syncope type  Orthostatic hypotension    Rx / DC Orders ED Discharge Orders    None       Raeford Razor, MD 04/05/20 2121

## 2020-04-03 NOTE — ED Notes (Signed)
Discharge instructions were discussed with pt and sister [name] at bedside. With no questions at this time.   Report and discharge instructions discussed with Nursing Home RN Maralyn Sago B.   Pt to go back to facility with sister Elita Quick

## 2020-04-03 NOTE — ED Notes (Signed)
Dr. Juleen China informed pt's SBP > 190. No new orders received

## 2020-04-11 DIAGNOSIS — I639 Cerebral infarction, unspecified: Secondary | ICD-10-CM

## 2020-04-11 HISTORY — DX: Cerebral infarction, unspecified: I63.9

## 2020-04-28 ENCOUNTER — Emergency Department (HOSPITAL_COMMUNITY): Payer: Medicare Other

## 2020-04-28 ENCOUNTER — Inpatient Hospital Stay (HOSPITAL_COMMUNITY)
Admission: EM | Admit: 2020-04-28 | Discharge: 2020-05-05 | DRG: 065 | Disposition: A | Discharge status: Home Health | Payer: Medicare Other | Attending: Internal Medicine | Admitting: Internal Medicine

## 2020-04-28 ENCOUNTER — Other Ambulatory Visit: Payer: Self-pay

## 2020-04-28 ENCOUNTER — Encounter (HOSPITAL_COMMUNITY): Payer: Self-pay | Admitting: Emergency Medicine

## 2020-04-28 DIAGNOSIS — I639 Cerebral infarction, unspecified: Secondary | ICD-10-CM | POA: Diagnosis not present

## 2020-04-28 DIAGNOSIS — H903 Sensorineural hearing loss, bilateral: Secondary | ICD-10-CM | POA: Diagnosis present

## 2020-04-28 DIAGNOSIS — R29810 Facial weakness: Secondary | ICD-10-CM | POA: Diagnosis present

## 2020-04-28 DIAGNOSIS — M810 Age-related osteoporosis without current pathological fracture: Secondary | ICD-10-CM | POA: Diagnosis present

## 2020-04-28 DIAGNOSIS — G919 Hydrocephalus, unspecified: Secondary | ICD-10-CM

## 2020-04-28 DIAGNOSIS — Z8673 Personal history of transient ischemic attack (TIA), and cerebral infarction without residual deficits: Secondary | ICD-10-CM

## 2020-04-28 DIAGNOSIS — Z7401 Bed confinement status: Secondary | ICD-10-CM

## 2020-04-28 DIAGNOSIS — Z885 Allergy status to narcotic agent status: Secondary | ICD-10-CM

## 2020-04-28 DIAGNOSIS — Z79899 Other long term (current) drug therapy: Secondary | ICD-10-CM

## 2020-04-28 DIAGNOSIS — G912 (Idiopathic) normal pressure hydrocephalus: Secondary | ICD-10-CM | POA: Diagnosis present

## 2020-04-28 DIAGNOSIS — Z20822 Contact with and (suspected) exposure to covid-19: Secondary | ICD-10-CM | POA: Diagnosis present

## 2020-04-28 DIAGNOSIS — R262 Difficulty in walking, not elsewhere classified: Secondary | ICD-10-CM | POA: Diagnosis present

## 2020-04-28 DIAGNOSIS — Z882 Allergy status to sulfonamides status: Secondary | ICD-10-CM

## 2020-04-28 DIAGNOSIS — I5032 Chronic diastolic (congestive) heart failure: Secondary | ICD-10-CM | POA: Diagnosis present

## 2020-04-28 DIAGNOSIS — I110 Hypertensive heart disease with heart failure: Secondary | ICD-10-CM | POA: Diagnosis present

## 2020-04-28 DIAGNOSIS — I951 Orthostatic hypotension: Secondary | ICD-10-CM | POA: Diagnosis present

## 2020-04-28 DIAGNOSIS — Z66 Do not resuscitate: Secondary | ICD-10-CM | POA: Diagnosis present

## 2020-04-28 DIAGNOSIS — E785 Hyperlipidemia, unspecified: Secondary | ICD-10-CM | POA: Diagnosis present

## 2020-04-28 DIAGNOSIS — I6381 Other cerebral infarction due to occlusion or stenosis of small artery: Secondary | ICD-10-CM | POA: Diagnosis not present

## 2020-04-28 DIAGNOSIS — R4181 Age-related cognitive decline: Secondary | ICD-10-CM | POA: Diagnosis present

## 2020-04-28 DIAGNOSIS — F419 Anxiety disorder, unspecified: Secondary | ICD-10-CM | POA: Diagnosis present

## 2020-04-28 DIAGNOSIS — I6521 Occlusion and stenosis of right carotid artery: Secondary | ICD-10-CM | POA: Diagnosis present

## 2020-04-28 DIAGNOSIS — R29702 NIHSS score 2: Secondary | ICD-10-CM | POA: Diagnosis present

## 2020-04-28 DIAGNOSIS — R1312 Dysphagia, oropharyngeal phase: Secondary | ICD-10-CM | POA: Diagnosis present

## 2020-04-28 LAB — URINALYSIS, ROUTINE W REFLEX MICROSCOPIC
Bilirubin Urine: NEGATIVE
Glucose, UA: NEGATIVE mg/dL
Hgb urine dipstick: NEGATIVE
Ketones, ur: NEGATIVE mg/dL
Leukocytes,Ua: NEGATIVE
Nitrite: NEGATIVE
Protein, ur: NEGATIVE mg/dL
Specific Gravity, Urine: 1.021 (ref 1.005–1.030)
pH: 8 (ref 5.0–8.0)

## 2020-04-28 LAB — I-STAT CHEM 8, ED
BUN: 13 mg/dL (ref 8–23)
Calcium, Ion: 1.18 mmol/L (ref 1.15–1.40)
Chloride: 97 mmol/L — ABNORMAL LOW (ref 98–111)
Creatinine, Ser: 1 mg/dL (ref 0.44–1.00)
Glucose, Bld: 87 mg/dL (ref 70–99)
HCT: 37 % (ref 36.0–46.0)
Hemoglobin: 12.6 g/dL (ref 12.0–15.0)
Potassium: 4.3 mmol/L (ref 3.5–5.1)
Sodium: 137 mmol/L (ref 135–145)
TCO2: 24 mmol/L (ref 22–32)

## 2020-04-28 LAB — COMPREHENSIVE METABOLIC PANEL
ALT: 23 U/L (ref 0–44)
AST: 27 U/L (ref 15–41)
Albumin: 3.6 g/dL (ref 3.5–5.0)
Alkaline Phosphatase: 88 U/L (ref 38–126)
Anion gap: 8 (ref 5–15)
BUN: 12 mg/dL (ref 8–23)
CO2: 28 mmol/L (ref 22–32)
Calcium: 9.3 mg/dL (ref 8.9–10.3)
Chloride: 100 mmol/L (ref 98–111)
Creatinine, Ser: 0.92 mg/dL (ref 0.44–1.00)
GFR calc Af Amer: >60 mL/min (ref >60)
GFR calc non Af Amer: >60 mL/min (ref >60)
Glucose, Bld: 91 mg/dL (ref 70–99)
Potassium: 4.4 mmol/L (ref 3.5–5.1)
Sodium: 136 mmol/L (ref 135–145)
Total Bilirubin: 0.7 mg/dL (ref 0.3–1.2)
Total Protein: 6.2 g/dL — ABNORMAL LOW (ref 6.5–8.1)

## 2020-04-28 LAB — CBC
HCT: 37.5 % (ref 36.0–46.0)
Hemoglobin: 12.3 g/dL (ref 12.0–15.0)
MCH: 30.9 pg (ref 26.0–34.0)
MCHC: 32.8 g/dL (ref 30.0–36.0)
MCV: 94.2 fL (ref 80.0–100.0)
Platelets: 158 10*3/uL (ref 150–400)
RBC: 3.98 MIL/uL (ref 3.87–5.11)
RDW: 13.2 % (ref 11.5–15.5)
WBC: 4.3 10*3/uL (ref 4.0–10.5)
nRBC: 0 % (ref 0.0–0.2)

## 2020-04-28 LAB — CBG MONITORING, ED: Glucose-Capillary: 83 mg/dL (ref 70–99)

## 2020-04-28 LAB — APTT: aPTT: 27 seconds (ref 24–36)

## 2020-04-28 LAB — DIFFERENTIAL
Abs Immature Granulocytes: 0.03 10*3/uL (ref 0.00–0.07)
Basophils Absolute: 0 10*3/uL (ref 0.0–0.1)
Basophils Relative: 1 %
Eosinophils Absolute: 0.1 10*3/uL (ref 0.0–0.5)
Eosinophils Relative: 3 %
Immature Granulocytes: 1 %
Lymphocytes Relative: 31 %
Lymphs Abs: 1.3 10*3/uL (ref 0.7–4.0)
Monocytes Absolute: 0.4 10*3/uL (ref 0.1–1.0)
Monocytes Relative: 9 %
Neutro Abs: 2.4 10*3/uL (ref 1.7–7.7)
Neutrophils Relative %: 55 %

## 2020-04-28 LAB — PROTIME-INR
INR: 0.9 (ref 0.8–1.2)
Prothrombin Time: 12.2 seconds (ref 11.4–15.2)

## 2020-04-28 LAB — RAPID URINE DRUG SCREEN, HOSP PERFORMED
Amphetamines: NOT DETECTED
Barbiturates: NOT DETECTED
Benzodiazepines: NOT DETECTED
Cocaine: NOT DETECTED
Opiates: NOT DETECTED
Tetrahydrocannabinol: NOT DETECTED

## 2020-04-28 LAB — ETHANOL: Alcohol, Ethyl (B): <10 mg/dL (ref <10)

## 2020-04-28 LAB — SARS CORONAVIRUS 2 BY RT PCR (HOSPITAL ORDER, PERFORMED IN ~~LOC~~ HOSPITAL LAB): SARS Coronavirus 2: NEGATIVE

## 2020-04-28 MED ORDER — ACETAMINOPHEN 160 MG/5ML PO SOLN: 650.0000 mg | ORAL | Status: DC | PRN

## 2020-04-28 MED ORDER — HYDRALAZINE HCL 20 MG/ML IJ SOLN
10.0000 mg | INTRAMUSCULAR | Status: DC | PRN
  Administered 2020-05-03: 10 mg via INTRAVENOUS
  Filled 2020-04-28: qty 1

## 2020-04-28 MED ORDER — STROKE: EARLY STAGES OF RECOVERY BOOK: Freq: Once | Status: DC

## 2020-04-28 MED ORDER — ASPIRIN 300 MG RE SUPP: 300.0000 mg | Freq: Every day | RECTAL | Status: DC

## 2020-04-28 MED ORDER — IOHEXOL 350 MG/ML SOLN
50.0000 mL | Freq: Once | INTRAVENOUS | Status: AC | PRN
  Administered 2020-04-28: 50 mL via INTRAVENOUS

## 2020-04-28 MED ORDER — SODIUM CHLORIDE 0.9 % IV SOLN: INTRAVENOUS | Status: DC

## 2020-04-28 MED ORDER — ACETAMINOPHEN 325 MG PO TABS
650.0000 mg | ORAL_TABLET | ORAL | Status: DC | PRN
  Administered 2020-05-04: 650 mg via ORAL
  Filled 2020-04-28: qty 2

## 2020-04-28 MED ORDER — ENOXAPARIN SODIUM 40 MG/0.4ML ~~LOC~~ SOLN
40.0000 mg | Freq: Every day | SUBCUTANEOUS | Status: DC
  Administered 2020-04-30 – 2020-05-05 (×6): 40 mg via SUBCUTANEOUS
  Filled 2020-04-28 (×7): qty 0.4

## 2020-04-28 MED ORDER — ASPIRIN 325 MG PO TABS: 325.0000 mg | ORAL_TABLET | Freq: Every day | ORAL | Status: DC

## 2020-04-28 MED ORDER — ACETAMINOPHEN 650 MG RE SUPP: 650.0000 mg | RECTAL | Status: DC | PRN

## 2020-04-28 NOTE — ED Notes (Signed)
Patient transported to MRI 

## 2020-04-28 NOTE — ED Provider Notes (Signed)
[facility] EMERGENCY DEPARTMENT Provider Note   CSN: [id number] Arrival date & time: 04/28/20  1638     History Chief Complaint  Patient presents with  . Code Stroke    Kristin Rush is a 79 y.o. female.  The history is provided by the patient, the EMS personnel and medical records. The history is limited by a language barrier. No language interpreter was used.  Cerebrovascular Accident This is a new problem. The current episode started 1 to 2 hours ago. The problem occurs constantly. The problem has not changed since onset.Associated symptoms include headaches. Pertinent negatives include no chest pain, no abdominal pain and no shortness of breath. Nothing aggravates the symptoms. Nothing relieves the symptoms. She has tried nothing for the symptoms. The treatment provided no relief.       Past Medical History:  Diagnosis Date  . 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 Lumbar spondylosis 02/18/2018  . Migraine   . Migraines 10/09/2011  . Orthostatic hypotension 10/23/2016  . Osteoporosis 04/16/2014  . Primary osteoarthritis of both hands 07/11/2017    Patient Active Problem List   Diagnosis Date Noted  . 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 Frequent falls 03/17/2020  . Postural dizziness with presyncope 03/17/2020  . Essential hypertension 03/17/2020  . Right ear pain 03/17/2020  . Excessive cerumen in both ear canals 03/17/2020  . Syncope and collapse 03/17/2020  . 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 Lumbar spondylosis 02/18/2018  . Primary osteoarthritis of both hands 07/11/2017  . Orthostatic hypotension 10/23/2016  . Osteoporosis 04/16/2014  . Migraines 10/09/2011    No past surgical history on file.   OB History   No obstetric history on file.     No family history on file.  Social History   Tobacco Use  . Smoking status: Never Smoker  . Smokeless tobacco: Never  Used  Substance Use Topics  . Alcohol use: No  . Drug use: No    Home Medications Prior to Admission medications   Medication Sig Start Date End Date Taking? Authorizing Provider  acetaminophen (TYLENOL) 500 MG tablet Take 500 mg by mouth daily as needed for mild pain.     [provider]  calcium-vitamin D (OSCAL WITH D) 500-200 MG-UNIT per tablet Take 1 tablet by mouth daily.    [provider]  cyanocobalamin 1000 MCG tablet Take 1,000 mcg by mouth daily. 04/19/18   [provider]  escitalopram (LEXAPRO) 10 MG tablet Take 10 mg by mouth daily.    [provider]  ibuprofen (ADVIL,MOTRIN) 200 MG tablet Take 600 mg by mouth daily as needed for moderate pain. For pain      [provider]  midodrine (PROAMATINE) 2.5 MG tablet Take 3 tablets (7.5 mg total) by mouth 2 (two) times daily with breakfast and lunch. 03/23/20   Drema Dallas, MD  midodrine (PROAMATINE) 5 MG tablet Take 1 tablet (5 mg total) by mouth at bedtime. 03/23/20   Drema Dallas, MD  Multiple Vitamins-Minerals (PRESERVISION AREDS) TABS Take 1 tablet by mouth daily.    [provider]  ondansetron (ZOFRAN) 4 MG tablet Take 1 tablet (4 mg total) by mouth every 6 (six) hours as needed for nausea. 03/23/20   Drema Dallas, MD  polyethylene glycol (MIRALAX / GLYCOLAX) 17 g packet Take 17 g by mouth daily as needed for mild constipation. 03/23/20   Drema Dallas, MD  Allergies    Codeine and Sulfa antibiotics  Review of Systems   Review of Systems  Constitutional: Negative for chills, diaphoresis, fatigue and fever.  HENT: Negative for congestion.   Eyes: Negative for visual disturbance.  Respiratory: Negative for cough, chest tightness, shortness of breath and wheezing.   Cardiovascular: Negative for chest pain, palpitations and leg swelling.  Gastrointestinal: Negative for abdominal pain, constipation, diarrhea, nausea and vomiting.  Genitourinary: Negative for  dysuria, flank pain and frequency.  Musculoskeletal: Negative for back pain, neck pain and neck stiffness.  Skin: Negative for rash and wound.  Neurological: Positive for facial asymmetry and headaches. Negative for dizziness, seizures, speech difficulty, weakness, light-headedness and numbness.  Psychiatric/Behavioral: Negative for agitation and confusion.  All other systems reviewed and are negative.   Physical Exam Updated Vital Signs Wt 59.1 kg   BMI 23.08 kg/m   Physical Exam Vitals and nursing note reviewed.  Constitutional:      General: She is not in acute distress.    Appearance: She is well-developed. She is not ill-appearing, toxic-appearing or diaphoretic.  HENT:     Head: Atraumatic.     Right Ear: External ear normal.     Left Ear: External ear normal.     Nose: Nose normal. No congestion or rhinorrhea.     Mouth/Throat:     Mouth: Mucous membranes are moist.     Pharynx: No oropharyngeal exudate or posterior oropharyngeal erythema.  Eyes:     Conjunctiva/sclera: Conjunctivae normal.     Pupils: Pupils are equal, round, and reactive to light.  Cardiovascular:     Rate and Rhythm: Normal rate.     Pulses: Normal pulses.     Heart sounds: No murmur heard.   Pulmonary:     Effort: Pulmonary effort is normal. No respiratory distress.     Breath sounds: No stridor. No wheezing, rhonchi or rales.  Chest:     Chest wall: No tenderness.  Abdominal:     General: Abdomen is flat. There is no distension.     Tenderness: There is no abdominal tenderness. There is no rebound.  Musculoskeletal:        General: No tenderness.     Cervical back: Normal range of motion and neck supple. No tenderness.  Skin:    General: Skin is warm.     Findings: No erythema or rash.  Neurological:     Mental Status: She is alert and oriented to person, place, and time.     Sensory: No sensory deficit.     Motor: Weakness present. No abnormal muscle tone.     Deep Tendon Reflexes:  Reflexes are normal and symmetric.     Comments: Left facial droop sparing the forehead.  Normal sensation and strength in the rest of body.  Pupils symmetric and reactive with normal extraocular movements.  Clear speech.  Symmetric palate elevation.  Exam otherwise unremarkable.  Psychiatric:        Mood and Affect: Mood normal.     ED Results / Procedures / Treatments   Labs (all labs ordered are listed, but only abnormal results are displayed) Labs Reviewed  COMPREHENSIVE METABOLIC PANEL - Abnormal; Notable for the following components:      Result Value   Total Protein 6.2 (*)    All other components within normal limits  URINALYSIS, ROUTINE W REFLEX MICROSCOPIC - Abnormal; Notable for the following components:   Color, Urine STRAW (*)    All other components within normal limits  I-STAT CHEM 8, ED - Abnormal; Notable for the following components:   Chloride 97 (*)    All other components within normal limits  SARS CORONAVIRUS 2 BY RT PCR (HOSPITAL ORDER, PERFORMED IN [facility])  ETHANOL  PROTIME-INR  APTT  CBC  DIFFERENTIAL  RAPID URINE DRUG SCREEN, HOSP PERFORMED  CBG MONITORING, ED    EKG EKG Interpretation  Date/Time:  Wednesday April 28 2020 17:08:26 EDT Ventricular Rate:  80 PR Interval:    QRS Duration: 80 QT Interval:  367 QTC Calculation: 424 R Axis:   70 Text Interpretation: Sinus rhythm Minimal ST depression, diffuse leads When compared to prior, similar apperance. No STEMI Confirmed by Theda Belfast ([id number]) on 04/28/2020 5:09:52 PM   Radiology CT Code Stroke CTA Head W/WO contrast  Result Date: 04/28/2020 CLINICAL DATA:  Focal neuro deficit greater than 6 hours. Isolated facial droop. EXAM: CT ANGIOGRAPHY HEAD AND NECK TECHNIQUE: Multidetector CT imaging of the head and neck was performed using the standard protocol during bolus administration of intravenous contrast. Multiplanar CT image reconstructions and MIPs were obtained to evaluate  the vascular anatomy. Carotid stenosis measurements (when applicable) are obtained utilizing NASCET criteria, using the distal internal carotid diameter as the denominator. CONTRAST:  50mL OMNIPAQUE IOHEXOL 350 MG/ML SOLN COMPARISON:  CT head 04/28/2020.  MRI head 04/03/2020 FINDINGS: CTA NECK FINDINGS Aortic arch: Mild atherosclerotic calcification aortic arch. Bovine branching pattern. Proximal great vessels widely patent. Right carotid system: Mild noncalcified plaque in the right common carotid artery artery. Atherosclerotic calcification in the right carotid bulb with approximately 25% diameter stenosis of the right internal carotid artery. Right external carotid artery widely patent. Left carotid system: Left common carotid artery widely patent. Mild atherosclerotic calcification left carotid bifurcation without significant stenosis. Vertebral arteries: Right vertebral artery dominant and patent to the basilar without stenosis. Small left vertebral artery which is hypoplastic distal to PICA. Small contribution to the basilar. Skeleton: Cervical spondylosis.  No acute skeletal abnormality. Other neck: Negative for mass or adenopathy Upper chest: Apical pleural scarring bilaterally. Lung apices clear bilaterally. Review of the MIP images confirms the above findings CTA HEAD FINDINGS Anterior circulation: Atherosclerotic calcification in the cavernous carotid bilaterally without stenosis. Anterior and middle cerebral arteries patent bilaterally without large vessel occlusion. Mild atherosclerotic irregularity in the M3 branches bilaterally Posterior circulation: Both vertebral arteries contribute to the basilar with right vertebral artery dominant. PICA patent bilaterally. Basilar widely patent. Superior cerebellar and posterior cerebral arteries patent bilaterally. Fetal origin right posterior cerebral artery. Venous sinuses: Minimal venous contrast due to arterial phase scanning Anatomic variants: None Review  of the MIP images confirms the above findings IMPRESSION: 1. No significant carotid or vertebral artery stenosis in the neck. Mild atherosclerotic disease. 2. Negative for intracranial large vessel occlusion 3. Mild atherosclerotic irregularity in the M3 branches bilaterally. Electronically Signed   By: Marlan Palau M.D.   On: 04/28/2020 17:14   CT Code Stroke CTA Neck W/WO contrast  Result Date: 04/28/2020 CLINICAL DATA:  Focal neuro deficit greater than 6 hours. Isolated facial droop. EXAM: CT ANGIOGRAPHY HEAD AND NECK TECHNIQUE: Multidetector CT imaging of the head and neck was performed using the standard protocol during bolus administration of intravenous contrast. Multiplanar CT image reconstructions and MIPs were obtained to evaluate the vascular anatomy. Carotid stenosis measurements (when applicable) are obtained utilizing NASCET criteria, using the distal internal carotid diameter as the denominator. CONTRAST:  50mL OMNIPAQUE IOHEXOL 350 MG/ML SOLN COMPARISON:  CT head 04/28/2020.  MRI head 04/03/2020 FINDINGS: CTA NECK FINDINGS Aortic arch: Mild atherosclerotic calcification aortic arch. Bovine branching pattern. Proximal great vessels widely patent. Right carotid system: Mild noncalcified plaque in the right common carotid artery artery. Atherosclerotic calcification in the right carotid bulb with approximately 25% diameter stenosis of the right internal carotid artery. Right external carotid artery widely patent. Left carotid system: Left common carotid artery widely patent. Mild atherosclerotic calcification left carotid bifurcation without significant stenosis. Vertebral arteries: Right vertebral artery dominant and patent to the basilar without stenosis. Small left vertebral artery which is hypoplastic distal to PICA. Small contribution to the basilar. Skeleton: Cervical spondylosis.  No acute skeletal abnormality. Other neck: Negative for mass or adenopathy Upper chest: Apical pleural scarring  bilaterally. Lung apices clear bilaterally. Review of the MIP images confirms the above findings CTA HEAD FINDINGS Anterior circulation: Atherosclerotic calcification in the cavernous carotid bilaterally without stenosis. Anterior and middle cerebral arteries patent bilaterally without large vessel occlusion. Mild atherosclerotic irregularity in the M3 branches bilaterally Posterior circulation: Both vertebral arteries contribute to the basilar with right vertebral artery dominant. PICA patent bilaterally. Basilar widely patent. Superior cerebellar and posterior cerebral arteries patent bilaterally. Fetal origin right posterior cerebral artery. Venous sinuses: Minimal venous contrast due to arterial phase scanning Anatomic variants: None Review of the MIP images confirms the above findings IMPRESSION: 1. No significant carotid or vertebral artery stenosis in the neck. Mild atherosclerotic disease. 2. Negative for intracranial large vessel occlusion 3. Mild atherosclerotic irregularity in the M3 branches bilaterally. Electronically Signed   By: Marlan Palau M.D.   On: 04/28/2020 17:14   MR BRAIN WO CONTRAST  Result Date: 04/28/2020 CLINICAL DATA:  Acute neuro deficit. EXAM: MRI HEAD WITHOUT CONTRAST TECHNIQUE: Multiplanar, multiecho pulse sequences of the brain and surrounding structures were obtained without intravenous contrast. COMPARISON:  MRI head 04/03/2020.  CT angio head and neck 04/28/2020 FINDINGS: Brain: Acute infarct in the right Corona radiata extending into the deep white matter tracks. No other area of acute infarct. Mild chronic microvascular ischemic change in the white matter Mild to moderate ventricular enlargement is unchanged. Ventricular enlargement is greater than the level of atrophy suggesting hydrocephalus. Third fourth and lateral ventricles all dilated. Negative for intracranial hemorrhage or mass lesion. Vascular: Normal arterial flow voids. Skull and upper cervical spine: No focal  skeletal lesion. Sinuses/Orbits: Paranasal sinuses clear.  Negative orbit. Other: Multilocular cyst anterior to the C2 vertebral body on the left measuring approximately 24 x 8 mm. This appears to be within the longus coli muscle on the left. No change from the recent MRI. IMPRESSION: Acute infarct in the right corona radiata and deep white matter tracks. Mild to moderate ventricular enlargement compatible with hydrocephalus. 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 Ventricle size stable since the recent MRI of 04/03/2020. 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 Uncertain etiology. Possible chronic tendonitis. Electronically Signed   By: Marlan Palau M.D.   On: 04/28/2020 18:39   CT HEAD CODE STROKE WO CONTRAST  Result Date: 04/28/2020 CLINICAL DATA:  Code stroke.  Facial droop. EXAM: CT HEAD WITHOUT CONTRAST TECHNIQUE: Contiguous axial images were obtained from the base of the skull through the vertex without intravenous contrast. COMPARISON:  Head MRI 04/03/2020 FINDINGS: Brain: There is no evidence of an acute infarct, intracranial hemorrhage, mass, midline shift, or extra-axial fluid collection. Hypodensities in the cerebral white matter bilaterally are nonspecific but compatible with mild chronic small vessel ischemic disease. A chronic lacunar infarct is  again noted in the right thalamus. Mild ventriculomegaly is unchanged and out of proportion to the size of the sulci and may reflect central predominant cerebral atrophy or normal pressure hydrocephalus. Vascular: Calcified atherosclerosis at the skull base. No hyperdense vessel. Skull: No fracture or suspicious osseous lesion. Sinuses/Orbits: Visualized paranasal sinuses and mastoid air cells are clear. Orbits are unremarkable. Other: None. ASPECTS [facility]([place] Stroke Program Early CT Score) - Ganglionic level infarction (caudate, lentiform nuclei, internal capsule, insula, M1-M3 cortex): 7 - Supraganglionic  infarction (M4-M6 cortex): 3 Total score (0-10 with 10 being normal): 10 IMPRESSION: 1. No evidence of acute intracranial abnormality. 2. ASPECTS is 10. 3. Mild chronic small vessel ischemic disease. These results were communicated to Dr. Amada JupiterKirkpatrick at 4:57 pm on 04/28/2020 by text page via the [facility] messaging system. Electronically Signed   By: Sebastian AcheAllen  Grady M.D.   On: 04/28/2020 16:58    Procedures Procedures (including critical care time)  Medications Ordered in ED Medications  iohexol (OMNIPAQUE) 350 MG/ML injection 50 mL (50 mLs Intravenous Contrast Given 04/28/20 1701)    ED Course  I have reviewed the triage vital signs and the nursing notes.  Pertinent labs & imaging results that were available during my care of the patient were reviewed by me and considered in my medical decision making (see chart for details).    MDM Rules/Calculators/A&P                          Miguel RotaSue Hudson Rush is a 79 y.o. female with a past medical history significant for migraines and hypertension who presents for left facial droop.  Patient was last normal at 3 PM.  According to EMS, patient was last normal at 3 PM and then was noticed to have left facial droop since then.  The reports she has not had a recent trauma but did have a rising blood pressure during transfer.  Her blood pressures was in the 150s and then rose to over 200 systolic during transport.  They report her glucose was 91 when they checked it.  She reported some headache that was 10 out of 10 initially but that has resolved.  Otherwise, she has not had further symptoms and her symptoms been persistent since onset.  On arrival, airway was screened at the bridge and her airway was clear.  Her lungs were clear and chest and abdomen were nontender.  She had no difficulty breathing or swallowing on my exam.  She had left-sided facial droop in the lower face that did appear to spare the forehead.  Symmetric grip strength and sensation in the arms.   Symmetric strength and sensation in the legs.  Pupils are symmetric reactive with normal extraocular movements.  Neurology saw the patient upon arrival to the hospital and obtain CT and CTA initially.  Neurology recommended MRI and then discussing disposition after MRI.  6:58 PM MRI does show acute stroke and neurology is recommending admission for stroke work-up.  Medicine team called for admission.   Final Clinical Impression(s) / ED Diagnoses Final diagnoses:  Facial droop due to acute stroke [facility](HCC)  Cerebrovascular accident (CVA), unspecified mechanism (HCC)    Clinical Impression: 1. Facial droop due to acute stroke (HCC)   2. Cerebrovascular accident (CVA), unspecified mechanism (HCC)     Disposition: Admit  This note was prepared with assistance of Dragon voice recognition software. Occasional wrong-word or sound-a-like substitutions may have occurred due to the inherent limitations of voice recognition software.  Harnoor Kohles, Canary Brim, MD 04/28/20 2031

## 2020-04-28 NOTE — H&P (Signed)
History and Physical    Khaleelah Yowell XAJ:[id number] DOB: May 04, 1941 DOA: 04/28/2020  PCP: Eartha Inch, MD  Patient coming from: Home.  History obtained from patient's sister as patient has some cognitive difficulties.  Chief Complaint: Left facial droop.  HPI: Kristin Rush is a 79 y.o. female with history of recently admission for recurrent syncope with orthostatic hypotension, CHF was brought to the ER after patient was found to have a left facial droop noticed around 3 PM.  Patient as per the patient's sister has not been doing well for the last couple of days with difficulty ambulating.  Has been found to have frequent episodes of low blood pressure.  Has not noticed any weakness of the upper or lower extremities.  Since last discharge last month patient has been largely bedbound as per the patient's sister.  ED Course: In the ER code stroke was activated.  Neurology on-call was consulted CT head was unremarkable.  CT angiogram of the head and neck was not showing any large vessel obstruction.  Patient was considered not a candidate for TPA since the only deficit was left facial droop.  MRI of the brain confirms acute stroke involving the right corona radiata.  In addition seen was hydrocephalus.  Labs are largely unremarkable.  Covid test was negative.  EKG is pending.  Monitor shows sinus rhythm.  Per patient's sister patient has been having recently choking episodes so speech therapy evaluation recommended.  Review of Systems: As per HPI, rest all negative.   Past Medical History:  Diagnosis Date  . 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 Lumbar spondylosis 02/18/2018  . Migraine   . Migraines 10/09/2011  . Orthostatic hypotension 10/23/2016  . Osteoporosis 04/16/2014  . Primary osteoarthritis of both hands 07/11/2017    History reviewed. No pertinent surgical history.   reports that she has never smoked. She has never  used smokeless tobacco. She reports that she does not drink alcohol and does not use drugs.  Allergies  Allergen Reactions  . Codeine Nausea And Vomiting  . Sulfa Antibiotics     Family History  Family history unknown: Yes    Prior to Admission medications   Medication Sig Start Date End Date Taking? Authorizing Provider  acetaminophen (TYLENOL) 500 MG tablet Take 500 mg by mouth daily as needed for mild pain.    Yes [provider]  calcium-vitamin D (OSCAL WITH D) 500-200 MG-UNIT per tablet Take 1 tablet by mouth daily.   Yes [provider]  cyanocobalamin 1000 MCG tablet Take 1,000 mcg by mouth daily. 04/19/18  Yes [provider]  escitalopram (LEXAPRO) 10 MG tablet Take 10 mg by mouth daily.   Yes [provider]  ibuprofen (ADVIL,MOTRIN) 200 MG tablet Take 600 mg by mouth daily as needed for moderate pain. For pain     Yes [provider]  midodrine (PROAMATINE) 2.5 MG tablet Take 3 tablets (7.5 mg total) by mouth 2 (two) times daily with breakfast and lunch. Patient taking differently: Take 2.5 mg by mouth at bedtime.  03/23/20  Yes Drema Dallas, MD  midodrine (PROAMATINE) 5 MG tablet Take 1 tablet (5 mg total) by mouth at bedtime. Patient taking differently: Take 5 mg by mouth 2 (two) times daily with a meal.  03/23/20  Yes Drema Dallas, MD  Multiple Vitamins-Minerals (PRESERVISION AREDS) TABS Take 1 tablet by mouth daily.   Yes [provider]  ondansetron (  ZOFRAN) 4 MG tablet Take 1 tablet (4 mg total) by mouth every 6 (six) hours as needed for nausea. 03/23/20  Yes Drema Dallas, MD  polyethylene glycol (MIRALAX / GLYCOLAX) 17 g packet Take 17 g by mouth daily as needed for mild constipation. 03/23/20  Yes Drema Dallas, MD    Physical Exam: Constitutional: Moderately built and nourished. Vitals:   04/28/20 1850 04/28/20 2013 04/28/20 2145 04/28/20 2300  BP: (!) 175/65 (!) 160/64 136/75 (!) 150/64  Pulse: 69 69 71  69  Resp: Temp:      TempSrc:      SpO2: 100% 98% 97% 97%  Weight:       Eyes: Anicteric no pallor. ENMT: No discharge from the ears eyes nose or mouth. Neck: No mass felt.  No neck rigidity. Respiratory: No rhonchi or crepitations. Cardiovascular: S1-S2 heard. Abdomen: Soft nontender bowel sounds present. Musculoskeletal: No edema. Skin: No rash. Neurologic: Alert awake oriented to her name.  Moves all extremities 5 x 5.  Left facial droop.  Pupils are equal and reacting to light. Psychiatric: Oriented to name.   Labs on Admission: I have personally reviewed following labs and imaging studies  CBC: Recent Labs  Lab 04/28/20 1649 04/28/20 1651  WBC  --  4.3  NEUTROABS  --  2.4  HGB 12.6 12.3  HCT 37.0 37.5  MCV  --  94.2  PLT  --  158   Basic Metabolic Panel: Recent Labs  Lab 04/28/20 1649 04/28/20 1651  NA 137 136  K 4.3 4.4  CL 97* 100  CO2  --  28  GLUCOSE 87 91  BUN 13 12  CREATININE 1.00 0.92  CALCIUM  --  9.3   GFR: Estimated Creatinine Clearance: 42.4 mL/min (by C-G formula based on SCr of 0.92 mg/dL). Liver Function Tests: Recent Labs  Lab 04/28/20 1651  AST 27  ALT 23  ALKPHOS 88  BILITOT 0.7  PROT 6.2*  ALBUMIN 3.6   No results for input(s): LIPASE, AMYLASE in the last 168 hours. No results for input(s): AMMONIA in the last 168 hours. Coagulation Profile: Recent Labs  Lab 04/28/20 1651  INR 0.9   Cardiac Enzymes: No results for input(s): CKTOTAL, CKMB, CKMBINDEX, TROPONINI in the last 168 hours. BNP (last 3 results) No results for input(s): PROBNP in the last 8760 hours. HbA1C: No results for input(s): HGBA1C in the last 72 hours. CBG: Recent Labs  Lab 04/28/20 1641  GLUCAP 83   Lipid Profile: No results for input(s): CHOL, HDL, LDLCALC, TRIG, CHOLHDL, LDLDIRECT in the last 72 hours. Thyroid Function Tests: No results for input(s): TSH, T4TOTAL, FREET4, T3FREE, THYROIDAB in the last 72 hours. Anemia Panel: No  results for input(s): VITAMINB12, FOLATE, FERRITIN, TIBC, IRON, RETICCTPCT in the last 72 hours. Urine analysis:    Component Value Date/Time   COLORURINE STRAW (A) 04/28/2020 1649   APPEARANCEUR CLEAR 04/28/2020 1649   LABSPEC 1.021 04/28/2020 1649   PHURINE 8.0 04/28/2020 1649   GLUCOSEU NEGATIVE 04/28/2020 1649   HGBUR NEGATIVE 04/28/2020 1649   BILIRUBINUR NEGATIVE 04/28/2020 1649   KETONESUR NEGATIVE 04/28/2020 1649   PROTEINUR NEGATIVE 04/28/2020 1649   UROBILINOGEN 0.2 10/03/2011 1002   NITRITE NEGATIVE 04/28/2020 1649   LEUKOCYTESUR NEGATIVE 04/28/2020 1649   Sepsis Labs: (procalcitonin:4,lacticidven:4) ) Recent Results (from the past 240 hour(s))  SARS Coronavirus 2 by RT PCR (hospital order, performed in [facility] hospital lab) Nasopharyngeal Nasopharyngeal Swab  Status: None   Collection Time: 04/28/20  8:12 PM   Specimen: Nasopharyngeal Swab  Result Value Ref Range Status   SARS Coronavirus 2 NEGATIVE NEGATIVE Final    Comment: (NOTE) SARS-CoV-2 target nucleic acids are NOT DETECTED.  The SARS-CoV-2 RNA is generally detectable in upper and lower respiratory specimens during the acute phase of infection. The lowest concentration of SARS-CoV-2 viral copies this assay can detect is 250 copies / mL. A negative result does not preclude SARS-CoV-2 infection and should not be used as the sole basis for treatment or other patient management decisions.  A negative result may occur with improper specimen collection / handling, submission of specimen other than nasopharyngeal swab, presence of viral mutation(s) within the areas targeted by this assay, and inadequate number of viral copies (<250 copies / mL). A negative result must be combined with clinical observations, patient history, and epidemiological information.  Fact Sheet for Patients:   BoilerBrush.com.cyhttps://www.fda.gov/media/136312/download  Fact Sheet for Healthcare  Providers: https://pope.com/https://www.fda.gov/media/136313/download  This test is not yet approved or  cleared by the [place] FDA and has been authorized for detection and/or diagnosis of SARS-CoV-2 by FDA under an Emergency Use Authorization (EUA).  This EUA will remain in effect (meaning this test can be used) for the duration of the COVID-19 declaration under Section 564(b)(1) of the Act, 21 U.S.C. section 360bbb-3(b)(1), unless the authorization is terminated or revoked sooner.  Performed at [facility], [address] [place], [place] [address]      Radiological Exams on Admission: CT Code Stroke CTA Head W/WO contrast  Result Date: 04/28/2020 CLINICAL DATA:  Focal neuro deficit greater than 6 hours. Isolated facial droop. EXAM: CT ANGIOGRAPHY HEAD AND NECK TECHNIQUE: Multidetector CT imaging of the head and neck was performed using the standard protocol during bolus administration of intravenous contrast. Multiplanar CT image reconstructions and MIPs were obtained to evaluate the vascular anatomy. Carotid stenosis measurements (when applicable) are obtained utilizing NASCET criteria, using the distal internal carotid diameter as the denominator. CONTRAST:  50mL OMNIPAQUE IOHEXOL 350 MG/ML SOLN COMPARISON:  CT head 04/28/2020.  MRI head 04/03/2020 FINDINGS: CTA NECK FINDINGS Aortic arch: Mild atherosclerotic calcification aortic arch. Bovine branching pattern. Proximal great vessels widely patent. Right carotid system: Mild noncalcified plaque in the right common carotid artery artery. Atherosclerotic calcification in the right carotid bulb with approximately 25% diameter stenosis of the right internal carotid artery. Right external carotid artery widely patent. Left carotid system: Left common carotid artery widely patent. Mild atherosclerotic calcification left carotid bifurcation without significant stenosis. Vertebral arteries: Right vertebral artery dominant and patent to the basilar  without stenosis. Small left vertebral artery which is hypoplastic distal to PICA. Small contribution to the basilar. Skeleton: Cervical spondylosis.  No acute skeletal abnormality. Other neck: Negative for mass or adenopathy Upper chest: Apical pleural scarring bilaterally. Lung apices clear bilaterally. Review of the MIP images confirms the above findings CTA HEAD FINDINGS Anterior circulation: Atherosclerotic calcification in the cavernous carotid bilaterally without stenosis. Anterior and middle cerebral arteries patent bilaterally without large vessel occlusion. Mild atherosclerotic irregularity in the M3 branches bilaterally Posterior circulation: Both vertebral arteries contribute to the basilar with right vertebral artery dominant. PICA patent bilaterally. Basilar widely patent. Superior cerebellar and posterior cerebral arteries patent bilaterally. Fetal origin right posterior cerebral artery. Venous sinuses: Minimal venous contrast due to arterial phase scanning Anatomic variants: None Review of the MIP images confirms the above findings IMPRESSION: 1. No significant carotid or vertebral artery stenosis in  the neck. Mild atherosclerotic disease. 2. Negative for intracranial large vessel occlusion 3. Mild atherosclerotic irregularity in the M3 branches bilaterally. Electronically Signed   By: Marlan Palau M.D.   On: 04/28/2020 17:14   CT Code Stroke CTA Neck W/WO contrast  Result Date: 04/28/2020 CLINICAL DATA:  Focal neuro deficit greater than 6 hours. Isolated facial droop. EXAM: CT ANGIOGRAPHY HEAD AND NECK TECHNIQUE: Multidetector CT imaging of the head and neck was performed using the standard protocol during bolus administration of intravenous contrast. Multiplanar CT image reconstructions and MIPs were obtained to evaluate the vascular anatomy. Carotid stenosis measurements (when applicable) are obtained utilizing NASCET criteria, using the distal internal carotid diameter as the denominator.  CONTRAST:  25mL OMNIPAQUE IOHEXOL 350 MG/ML SOLN COMPARISON:  CT head 04/28/2020.  MRI head 04/03/2020 FINDINGS: CTA NECK FINDINGS Aortic arch: Mild atherosclerotic calcification aortic arch. Bovine branching pattern. Proximal great vessels widely patent. Right carotid system: Mild noncalcified plaque in the right common carotid artery artery. Atherosclerotic calcification in the right carotid bulb with approximately 25% diameter stenosis of the right internal carotid artery. Right external carotid artery widely patent. Left carotid system: Left common carotid artery widely patent. Mild atherosclerotic calcification left carotid bifurcation without significant stenosis. Vertebral arteries: Right vertebral artery dominant and patent to the basilar without stenosis. Small left vertebral artery which is hypoplastic distal to PICA. Small contribution to the basilar. Skeleton: Cervical spondylosis.  No acute skeletal abnormality. Other neck: Negative for mass or adenopathy Upper chest: Apical pleural scarring bilaterally. Lung apices clear bilaterally. Review of the MIP images confirms the above findings CTA HEAD FINDINGS Anterior circulation: Atherosclerotic calcification in the cavernous carotid bilaterally without stenosis. Anterior and middle cerebral arteries patent bilaterally without large vessel occlusion. Mild atherosclerotic irregularity in the M3 branches bilaterally Posterior circulation: Both vertebral arteries contribute to the basilar with right vertebral artery dominant. PICA patent bilaterally. Basilar widely patent. Superior cerebellar and posterior cerebral arteries patent bilaterally. Fetal origin right posterior cerebral artery. Venous sinuses: Minimal venous contrast due to arterial phase scanning Anatomic variants: None Review of the MIP images confirms the above findings IMPRESSION: 1. No significant carotid or vertebral artery stenosis in the neck. Mild atherosclerotic disease. 2. Negative for  intracranial large vessel occlusion 3. Mild atherosclerotic irregularity in the M3 branches bilaterally. Electronically Signed   By: Marlan Palau M.D.   On: 04/28/2020 17:14   MR BRAIN WO CONTRAST  Result Date: 04/28/2020 CLINICAL DATA:  Acute neuro deficit. EXAM: MRI HEAD WITHOUT CONTRAST TECHNIQUE: Multiplanar, multiecho pulse sequences of the brain and surrounding structures were obtained without intravenous contrast. COMPARISON:  MRI head 04/03/2020.  CT angio head and neck 04/28/2020 FINDINGS: Brain: Acute infarct in the right Corona radiata extending into the deep white matter tracks. No other area of acute infarct. Mild chronic microvascular ischemic change in the white matter Mild to moderate ventricular enlargement is unchanged. Ventricular enlargement is greater than the level of atrophy suggesting hydrocephalus. Third fourth and lateral ventricles all dilated. Negative for intracranial hemorrhage or mass lesion. Vascular: Normal arterial flow voids. Skull and upper cervical spine: No focal skeletal lesion. Sinuses/Orbits: Paranasal sinuses clear.  Negative orbit. Other: Multilocular cyst anterior to the C2 vertebral body on the left measuring approximately 24 x 8 mm. This appears to be within the longus coli muscle on the left. No change from the recent MRI. IMPRESSION: Acute infarct in the right corona radiata and deep white matter tracks. Mild to moderate ventricular enlargement compatible with hydrocephalus. 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 Ventricle size stable since the recent MRI of 04/03/2020. 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 Uncertain etiology. Possible chronic tendonitis. Electronically Signed   By: Marlan Palau M.D.   On: 04/28/2020 18:39   CT HEAD CODE STROKE WO CONTRAST  Result Date: 04/28/2020 CLINICAL DATA:  Code stroke.  Facial droop. EXAM: CT HEAD WITHOUT CONTRAST TECHNIQUE: Contiguous axial images were obtained from the  base of the skull through the vertex without intravenous contrast. COMPARISON:  Head MRI 04/03/2020 FINDINGS: Brain: There is no evidence of an acute infarct, intracranial hemorrhage, mass, midline shift, or extra-axial fluid collection. Hypodensities in the cerebral white matter bilaterally are nonspecific but compatible with mild chronic small vessel ischemic disease. A chronic lacunar infarct is again noted in the right thalamus. Mild ventriculomegaly is unchanged and out of proportion to the size of the sulci and may reflect central predominant cerebral atrophy or normal pressure hydrocephalus. Vascular: Calcified atherosclerosis at the skull base. No hyperdense vessel. Skull: No fracture or suspicious osseous lesion. Sinuses/Orbits: Visualized paranasal sinuses and mastoid air cells are clear. Orbits are unremarkable. Other: None. ASPECTS [facility] Stroke Program Early CT Score) - Ganglionic level infarction (caudate, lentiform nuclei, internal capsule, insula, M1-M3 cortex): 7 - Supraganglionic infarction (M4-M6 cortex): 3 Total score (0-10 with 10 being normal): 10 IMPRESSION: 1. No evidence of acute intracranial abnormality. 2. ASPECTS is 10. 3. Mild chronic small vessel ischemic disease. These results were communicated to Dr. Amada Jupiter at 4:57 pm on 04/28/2020 by text page via the [facility] messaging system. Electronically Signed   By: Sebastian Ache M.D.   On: 04/28/2020 16:58      Assessment/Plan Principal Problem:   Acute CVA (cerebrovascular accident) (HCC) Active Problems:   Orthostatic hypotension    1. Acute CVA -appreciate neurology input.  2D echo is pending check hemoglobin A1c lipid panel physical therapy consult and speech therapy consult.  Aspirin. 2. Hypertension with history of orthostatic hypotension we will keep patient on as needed IV hydralazine for systolic blood pressure more than 220 and diastolic more than 120. 3. History of CHF presently appears compensated. 4. Hydrocephalus  seen in the MRI per patient sister patient has declined previously any work-up for this.  Will await neurology input. 5. Cognitive difficulties could be from hydrocephalus.  Await neurology input.   DVT prophylaxis: Lovenox. Code Status: DNR confirmed with patient's sister. Family Communication: Patient's sister. Disposition Plan: Home. Consults called: Neurology, speech therapist. Admission status: Observation.   Eduard Clos MD [facility] Pager [phone].  If 7PM-7AM, please contact night-coverage [email or web address] Password [facility]  04/28/2020, 11:27 PM

## 2020-04-28 NOTE — Code Documentation (Signed)
Stroke Response Nurse Documentation Code Documentation  Kristin Rush is a 79 y.o. female arriving to [facility] ED via [facility] on 04/28/2020 with past medical hx of CVA and cognitive issues. Code stroke was activated by EMS. Patient from home where she was LKW at 1500 and now complaining of left facial droop. On No antithrombotic. Stroke team at the bedside on patient arrival. Labs drawn and patient cleared for CT by Dr. Rush Landmark. Patient to CT with team. NIHSS 1, see documentation for details and code stroke times. Patient with left facial droop on exam. The following imaging was completed:  CT, CTA head and neck. Patient is not a candidate for tPA due to mild symptoms Care/Plan. Bedside handoff with ED RN Wyn Forster .    Marcellina Millin  Stroke Response RN

## 2020-04-28 NOTE — Consult Note (Signed)
Neurology Consultation Reason for Consult: Facial droop Referring Physician: Tegeler, C  CC: Facial droop  History is obtained from: Patient  HPI: Kristin Rush is a 79 y.o. female who was noted to develop facial droop sometime after 3 PM.  She has a history of stroke with no residual deficits as well as memory loss.  She was activated as a code stroke in the field.  On arrival, she was taken for emergent CT, but after evaluation with isolated facial droop she was not deemed to be a candidate for TPA given that it was too mild to merit the risk of TPA.  LKW: 3 PM tpa given?: no, mild deficits    ROS: A 14 point ROS was performed and is negative except as noted in the HPI.   Past Medical History:  Diagnosis Date  . 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 Lumbar spondylosis 02/18/2018  . Migraine   . Migraines 10/09/2011  . Orthostatic hypotension 10/23/2016  . Osteoporosis 04/16/2014  . Primary osteoarthritis of both hands 07/11/2017     History reviewed. No pertinent family history.   Social History:  reports that she has never smoked. She has never used smokeless tobacco. She reports that she does not drink alcohol and does not use drugs.   Exam: Current vital signs: BP (!) 175/65   Pulse 69   Temp 98.5 F (36.9 C) (Oral)   Resp 12   Wt 59.1 kg   SpO2 100%   BMI 23.08 kg/m  Vital signs in last 24 hours: Temp:  [98.5 F (36.9 C)] 98.5 F (36.9 C) (08/18 1717) Pulse Rate:  [64-82] 69 (08/18 1850) Resp:  [12-14] 12 (08/18 1850) BP: (175-209)/(65-87) 175/65 (08/18 1850) SpO2:  [98 %-100 %] 100 % (08/18 1850) Weight:  [59.1 kg] 59.1 kg (08/18 1600)   Physical Exam  Constitutional: Appears well-developed and well-nourished.  Psych: Affect appropriate to situation Eyes: No scleral injection HENT: No OP obstrucion MSK: no joint deformities.  Cardiovascular: Normal rate and regular rhythm.  Respiratory: Effort  normal, non-labored breathing GI: Soft.  No distension. There is no tenderness.  Skin: WDI  Neuro: Mental Status: Patient is awake, alert, oriented to person, place, month, year, and situation. Patient is able to give a clear and coherent history. No signs of aphasia or neglect Cranial Nerves: II: Visual Fields are full. Pupils are equal, round, and reactive to light.   III,IV, VI: EOMI without ptosis or diploplia.  V: Facial sensation is symmetric to temperature VII: Facial movement is notable for left facial weakness VIII: hearing is intact to voice X: Uvula elevates symmetrically XI: Shoulder shrug is symmetric. XII: tongue is midline without atrophy or fasciculations.  Motor: Tone is normal. Bulk is normal. 5/5 strength was present in all four extremities.  Sensory: Sensation is symmetric to light touch and temperature in the arms and legs. Cerebellar: FNF and HKS are intact bilaterally   I have reviewed labs in epic and the results pertinent to this consultation are: CMP-unremarkable  I have reviewed the images obtained: CT head-negative, CTA negative  Impression: 79 year old female with acute onset right facial droop, suspect acute ischemic infarct.  Her symptoms are too mild to merit IV TPA.  She will need to be admitted admitted for secondary risk factor modification.  Recommendations: - HgbA1c, fasting lipid panel - MRI  of the brain without contrast - Frequent neuro checks - Echocardiogram - Carotid dopplers - Prophylactic therapy-Antiplatelet med:  Aspirin - dose 325mg  PO or 300mg  PR - Risk factor modification - Telemetry monitoring - PT consult, OT consult, Speech consult - Stroke team to follow   Roland Rack, MD [facility] [phone]  If 7pm- 7am, please page neurology on call as listed in [place].

## 2020-04-28 NOTE — ED Notes (Signed)
Pt's CBG result was 83. Informed [place] - RN.

## 2020-04-28 NOTE — ED Notes (Signed)
Copy of pt's CHEM 8 results given to Dr. Rush Landmark.

## 2020-04-29 ENCOUNTER — Inpatient Hospital Stay (HOSPITAL_COMMUNITY): Payer: Medicare Other

## 2020-04-29 ENCOUNTER — Observation Stay (HOSPITAL_COMMUNITY): Payer: Medicare Other

## 2020-04-29 DIAGNOSIS — R1312 Dysphagia, oropharyngeal phase: Secondary | ICD-10-CM | POA: Diagnosis present

## 2020-04-29 DIAGNOSIS — Z20822 Contact with and (suspected) exposure to covid-19: Secondary | ICD-10-CM | POA: Diagnosis present

## 2020-04-29 DIAGNOSIS — H903 Sensorineural hearing loss, bilateral: Secondary | ICD-10-CM | POA: Diagnosis present

## 2020-04-29 DIAGNOSIS — M810 Age-related osteoporosis without current pathological fracture: Secondary | ICD-10-CM | POA: Diagnosis present

## 2020-04-29 DIAGNOSIS — I6521 Occlusion and stenosis of right carotid artery: Secondary | ICD-10-CM | POA: Diagnosis present

## 2020-04-29 DIAGNOSIS — I639 Cerebral infarction, unspecified: Secondary | ICD-10-CM | POA: Diagnosis present

## 2020-04-29 DIAGNOSIS — G912 (Idiopathic) normal pressure hydrocephalus: Secondary | ICD-10-CM | POA: Diagnosis present

## 2020-04-29 DIAGNOSIS — Z7401 Bed confinement status: Secondary | ICD-10-CM | POA: Diagnosis not present

## 2020-04-29 DIAGNOSIS — I6381 Other cerebral infarction due to occlusion or stenosis of small artery: Secondary | ICD-10-CM | POA: Diagnosis present

## 2020-04-29 DIAGNOSIS — Z66 Do not resuscitate: Secondary | ICD-10-CM | POA: Diagnosis present

## 2020-04-29 DIAGNOSIS — I5032 Chronic diastolic (congestive) heart failure: Secondary | ICD-10-CM | POA: Diagnosis present

## 2020-04-29 DIAGNOSIS — R4181 Age-related cognitive decline: Secondary | ICD-10-CM | POA: Diagnosis present

## 2020-04-29 DIAGNOSIS — Z79899 Other long term (current) drug therapy: Secondary | ICD-10-CM | POA: Diagnosis not present

## 2020-04-29 DIAGNOSIS — R29702 NIHSS score 2: Secondary | ICD-10-CM | POA: Diagnosis present

## 2020-04-29 DIAGNOSIS — I110 Hypertensive heart disease with heart failure: Secondary | ICD-10-CM | POA: Diagnosis present

## 2020-04-29 DIAGNOSIS — F419 Anxiety disorder, unspecified: Secondary | ICD-10-CM | POA: Diagnosis present

## 2020-04-29 DIAGNOSIS — I951 Orthostatic hypotension: Secondary | ICD-10-CM | POA: Diagnosis present

## 2020-04-29 DIAGNOSIS — R404 Transient alteration of awareness: Secondary | ICD-10-CM

## 2020-04-29 DIAGNOSIS — R262 Difficulty in walking, not elsewhere classified: Secondary | ICD-10-CM | POA: Diagnosis present

## 2020-04-29 DIAGNOSIS — R29810 Facial weakness: Secondary | ICD-10-CM | POA: Diagnosis present

## 2020-04-29 DIAGNOSIS — Z885 Allergy status to narcotic agent status: Secondary | ICD-10-CM | POA: Diagnosis not present

## 2020-04-29 DIAGNOSIS — Z882 Allergy status to sulfonamides status: Secondary | ICD-10-CM | POA: Diagnosis not present

## 2020-04-29 DIAGNOSIS — Z8673 Personal history of transient ischemic attack (TIA), and cerebral infarction without residual deficits: Secondary | ICD-10-CM | POA: Diagnosis not present

## 2020-04-29 DIAGNOSIS — E785 Hyperlipidemia, unspecified: Secondary | ICD-10-CM | POA: Diagnosis present

## 2020-04-29 LAB — LIPID PANEL
Cholesterol: 261 mg/dL — ABNORMAL HIGH (ref 0–200)
HDL: 66 mg/dL (ref >40)
LDL Cholesterol: 183 mg/dL — ABNORMAL HIGH (ref 0–99)
Total CHOL/HDL Ratio: 4 RATIO
Triglycerides: 62 mg/dL (ref <150)
VLDL: 12 mg/dL (ref 0–40)

## 2020-04-29 LAB — HEMOGLOBIN A1C
Hgb A1c MFr Bld: 5.5 % (ref 4.8–5.6)
Mean Plasma Glucose: 111.15 mg/dL

## 2020-04-29 MED ORDER — CLOPIDOGREL BISULFATE 75 MG PO TABS
75.0000 mg | ORAL_TABLET | Freq: Every day | ORAL | Status: DC
  Administered 2020-04-29 – 2020-05-05 (×7): 75 mg via ORAL
  Filled 2020-04-29 (×7): qty 1

## 2020-04-29 MED ORDER — ATORVASTATIN CALCIUM 80 MG PO TABS
80.0000 mg | ORAL_TABLET | Freq: Every day | ORAL | Status: DC
  Administered 2020-04-29 – 2020-05-04 (×6): 80 mg via ORAL
  Filled 2020-04-29 (×6): qty 1

## 2020-04-29 MED ORDER — RESOURCE THICKENUP CLEAR PO POWD
ORAL | Status: DC | PRN
  Filled 2020-04-29 (×2): qty 125

## 2020-04-29 MED ORDER — STARCH (THICKENING) PO POWD
ORAL | Status: DC | PRN
  Filled 2020-04-29: qty 227

## 2020-04-29 MED ORDER — ASPIRIN EC 81 MG PO TBEC
81.0000 mg | DELAYED_RELEASE_TABLET | Freq: Every day | ORAL | Status: DC
  Administered 2020-04-29 – 2020-05-05 (×7): 81 mg via ORAL
  Filled 2020-04-29 (×7): qty 1

## 2020-04-29 MED ORDER — DIPHENHYDRAMINE HCL 50 MG/ML IJ SOLN
25.0000 mg | Freq: Once | INTRAMUSCULAR | Status: AC
  Administered 2020-04-29: 25 mg via INTRAVENOUS
  Filled 2020-04-29: qty 1

## 2020-04-29 NOTE — Progress Notes (Signed)
STROKE TEAM PROGRESS NOTE   INTERVAL HISTORY Patient remains in the ED. I have personally reviewed history of presenting illness, electronic medical records and imaging films in PACS.  She continues to have left facial weakness but otherwise doing well.  No new neurological symptoms.  Vital signs stable.  MRI scan of the brain confirms a right large caudate infarct CT angiogram of brain and neck showed no significant large vessel extracranial intracranial stenosis.  2D echo done on 03/27/2020 showed normal ejection fraction.  LDL cholesterol significantly elevated 183 mg percent.  Hemoglobin A1c normal at 5.5.  Vitals:   04/29/20 0346 04/29/20 0507 04/29/20 0601 04/29/20 0700  BP: 120/67 137/65 (!) 156/79 140/76  Pulse: 74 79 82 79  Resp: 15 19 19 15   Temp:      TempSrc:      SpO2: 91% 97% 99% 95%  Weight:       CBC:  Recent Labs  Lab 04/28/20 1649 04/28/20 1651  WBC  --  4.3  NEUTROABS  --  2.4  HGB 12.6 12.3  HCT 37.0 37.5  MCV  --  94.2  PLT  --  158   Basic Metabolic Panel:  Recent Labs  Lab 04/28/20 1649 04/28/20 1651  NA 137 136  K 4.3 4.4  CL 97* 100  CO2  --  28  GLUCOSE 87 91  BUN 13 12  CREATININE 1.00 0.92  CALCIUM  --  9.3   Lipid Panel:  Recent Labs  Lab 04/29/20 0817  CHOL 261*  TRIG 62  HDL 66  CHOLHDL 4.0  VLDL 12  LDLCALC 05/01/20*   HgbA1c:  Recent Labs  Lab 04/29/20 0817  HGBA1C 5.5   Urine Drug Screen:  Recent Labs  Lab 04/28/20 1649  LABOPIA NONE DETECTED  COCAINSCRNUR NONE DETECTED  LABBENZ NONE DETECTED  AMPHETMU NONE DETECTED  THCU NONE DETECTED  LABBARB NONE DETECTED    Alcohol Level  Recent Labs  Lab 04/28/20 1651  ETH <10    IMAGING past 24 hours CT Code Stroke CTA Head W/WO contrast  Result Date: 04/28/2020 CLINICAL DATA:  Focal neuro deficit greater than 6 hours. Isolated facial droop. EXAM: CT ANGIOGRAPHY HEAD AND NECK TECHNIQUE: Multidetector CT imaging of the head and neck was performed using the standard  protocol during bolus administration of intravenous contrast. Multiplanar CT image reconstructions and MIPs were obtained to evaluate the vascular anatomy. Carotid stenosis measurements (when applicable) are obtained utilizing NASCET criteria, using the distal internal carotid diameter as the denominator. CONTRAST:  25mL OMNIPAQUE IOHEXOL 350 MG/ML SOLN COMPARISON:  CT head 04/28/2020.  MRI head 04/03/2020 FINDINGS: CTA NECK FINDINGS Aortic arch: Mild atherosclerotic calcification aortic arch. Bovine branching pattern. Proximal great vessels widely patent. Right carotid system: Mild noncalcified plaque in the right common carotid artery artery. Atherosclerotic calcification in the right carotid bulb with approximately 25% diameter stenosis of the right internal carotid artery. Right external carotid artery widely patent. Left carotid system: Left common carotid artery widely patent. Mild atherosclerotic calcification left carotid bifurcation without significant stenosis. Vertebral arteries: Right vertebral artery dominant and patent to the basilar without stenosis. Small left vertebral artery which is hypoplastic distal to PICA. Small contribution to the basilar. Skeleton: Cervical spondylosis.  No acute skeletal abnormality. Other neck: Negative for mass or adenopathy Upper chest: Apical pleural scarring bilaterally. Lung apices clear bilaterally. Review of the MIP images confirms the above findings CTA HEAD FINDINGS Anterior circulation: Atherosclerotic calcification in the cavernous carotid bilaterally without stenosis.  Anterior and middle cerebral arteries patent bilaterally without large vessel occlusion. Mild atherosclerotic irregularity in the M3 branches bilaterally Posterior circulation: Both vertebral arteries contribute to the basilar with right vertebral artery dominant. PICA patent bilaterally. Basilar widely patent. Superior cerebellar and posterior cerebral arteries patent bilaterally. Fetal origin  right posterior cerebral artery. Venous sinuses: Minimal venous contrast due to arterial phase scanning Anatomic variants: None Review of the MIP images confirms the above findings IMPRESSION: 1. No significant carotid or vertebral artery stenosis in the neck. Mild atherosclerotic disease. 2. Negative for intracranial large vessel occlusion 3. Mild atherosclerotic irregularity in the M3 branches bilaterally. Electronically Signed   By: Marlan Palau M.D.   On: 04/28/2020 17:14   CT Code Stroke CTA Neck W/WO contrast  Result Date: 04/28/2020 CLINICAL DATA:  Focal neuro deficit greater than 6 hours. Isolated facial droop. EXAM: CT ANGIOGRAPHY HEAD AND NECK TECHNIQUE: Multidetector CT imaging of the head and neck was performed using the standard protocol during bolus administration of intravenous contrast. Multiplanar CT image reconstructions and MIPs were obtained to evaluate the vascular anatomy. Carotid stenosis measurements (when applicable) are obtained utilizing NASCET criteria, using the distal internal carotid diameter as the denominator. CONTRAST:  72mL OMNIPAQUE IOHEXOL 350 MG/ML SOLN COMPARISON:  CT head 04/28/2020.  MRI head 04/03/2020 FINDINGS: CTA NECK FINDINGS Aortic arch: Mild atherosclerotic calcification aortic arch. Bovine branching pattern. Proximal great vessels widely patent. Right carotid system: Mild noncalcified plaque in the right common carotid artery artery. Atherosclerotic calcification in the right carotid bulb with approximately 25% diameter stenosis of the right internal carotid artery. Right external carotid artery widely patent. Left carotid system: Left common carotid artery widely patent. Mild atherosclerotic calcification left carotid bifurcation without significant stenosis. Vertebral arteries: Right vertebral artery dominant and patent to the basilar without stenosis. Small left vertebral artery which is hypoplastic distal to PICA. Small contribution to the basilar. Skeleton:  Cervical spondylosis.  No acute skeletal abnormality. Other neck: Negative for mass or adenopathy Upper chest: Apical pleural scarring bilaterally. Lung apices clear bilaterally. Review of the MIP images confirms the above findings CTA HEAD FINDINGS Anterior circulation: Atherosclerotic calcification in the cavernous carotid bilaterally without stenosis. Anterior and middle cerebral arteries patent bilaterally without large vessel occlusion. Mild atherosclerotic irregularity in the M3 branches bilaterally Posterior circulation: Both vertebral arteries contribute to the basilar with right vertebral artery dominant. PICA patent bilaterally. Basilar widely patent. Superior cerebellar and posterior cerebral arteries patent bilaterally. Fetal origin right posterior cerebral artery. Venous sinuses: Minimal venous contrast due to arterial phase scanning Anatomic variants: None Review of the MIP images confirms the above findings IMPRESSION: 1. No significant carotid or vertebral artery stenosis in the neck. Mild atherosclerotic disease. 2. Negative for intracranial large vessel occlusion 3. Mild atherosclerotic irregularity in the M3 branches bilaterally. Electronically Signed   By: Marlan Palau M.D.   On: 04/28/2020 17:14   MR BRAIN WO CONTRAST  Result Date: 04/28/2020 CLINICAL DATA:  Acute neuro deficit. EXAM: MRI HEAD WITHOUT CONTRAST TECHNIQUE: Multiplanar, multiecho pulse sequences of the brain and surrounding structures were obtained without intravenous contrast. COMPARISON:  MRI head 04/03/2020.  CT angio head and neck 04/28/2020 FINDINGS: Brain: Acute infarct in the right Corona radiata extending into the deep white matter tracks. No other area of acute infarct. Mild chronic microvascular ischemic change in the white matter Mild to moderate ventricular enlargement is unchanged. Ventricular enlargement is greater than the level of atrophy suggesting hydrocephalus. Third fourth and lateral ventricles all  dilated.  Negative for intracranial hemorrhage or mass lesion. Vascular: Normal arterial flow voids. Skull and upper cervical spine: No focal skeletal lesion. Sinuses/Orbits: Paranasal sinuses clear.  Negative orbit. Other: Multilocular cyst anterior to the C2 vertebral body on the left measuring approximately 24 x 8 mm. This appears to be within the longus coli muscle on the left. No change from the recent MRI. IMPRESSION: Acute infarct in the right corona radiata and deep white matter tracks. Mild to moderate ventricular enlargement compatible with hydrocephalus. 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 Ventricle size stable since the recent MRI of 04/03/2020. 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 Uncertain etiology. Possible chronic tendonitis. Electronically Signed   By: Marlan Palauharles  Clark M.D.   On: 04/28/2020 18:39   CT HEAD CODE STROKE WO CONTRAST  Result Date: 04/28/2020 CLINICAL DATA:  Code stroke.  Facial droop. EXAM: CT HEAD WITHOUT CONTRAST TECHNIQUE: Contiguous axial images were obtained from the base of the skull through the vertex without intravenous contrast. COMPARISON:  Head MRI 04/03/2020 FINDINGS: Brain: There is no evidence of an acute infarct, intracranial hemorrhage, mass, midline shift, or extra-axial fluid collection. Hypodensities in the cerebral white matter bilaterally are nonspecific but compatible with mild chronic small vessel ischemic disease. A chronic lacunar infarct is again noted in the right thalamus. Mild ventriculomegaly is unchanged and out of proportion to the size of the sulci and may reflect central predominant cerebral atrophy or normal pressure hydrocephalus. Vascular: Calcified atherosclerosis at the skull base. No hyperdense vessel. Skull: No fracture or suspicious osseous lesion. Sinuses/Orbits: Visualized paranasal sinuses and mastoid air cells are clear. Orbits are unremarkable. Other: None. ASPECTS [facility]([place] Stroke Program  Early CT Score) - Ganglionic level infarction (caudate, lentiform nuclei, internal capsule, insula, M1-M3 cortex): 7 - Supraganglionic infarction (M4-M6 cortex): 3 Total score (0-10 with 10 being normal): 10 IMPRESSION: 1. No evidence of acute intracranial abnormality. 2. ASPECTS is 10. 3. Mild chronic small vessel ischemic disease. These results were communicated to Dr. Amada JupiterKirkpatrick at 4:57 pm on 04/28/2020 by text page via the [facility] messaging system. Electronically Signed   By: Sebastian AcheAllen  Grady M.D.   On: 04/28/2020 16:58    PHYSICAL EXAM Pleasant elderly Caucasian lady not in distress. . Afebrile. Head is nontraumatic. Neck is supple without bruit.    Cardiac exam no murmur or gallop. Lungs are clear to auscultation. Distal pulses are well felt. Neurological Exam :  Awake alert oriented to time place and person.  Intact attention, registration and recall.  Speech and language apparent normal except for mild dysarthria.  Extraocular movements are full range without nystagmus.  Blinks to threat bilaterally.  Left lower facial weakness.  Tongue midline.  Motor system exam reveals no upper or lower extremity drift but diminished fine finger movements on the left and orbits right over left upper extremity.  Symmetric lower extremity strength.  Sensation is normal.  Coordination is accurate.  Gait not tested. ASSESSMENT/PLAN Ms. Kristin Rush is a 79 y.o. female with history of history of stroke with no residual deficits as well as memory loss, migraine presenting with facial droop. Too mild for tPA.   Stroke:   R corona radiata infarct secondary to small vessel disease source  Code Stroke CT head No acute abnormality. Small vessel disease. ASPECTS 10.     CTA head no LVO. Mild B M3 atherosclerosis   CTA neck mild atherosclerosis   MRI  R corona radiata and deep white matter infarct. Mild to moderate hydrocephalus,  stable since 04/03/2020. Multilocal cyst L longus colli muscle stable.   2D Echo  03/17/2020. EF 60-65%. No source of embolus - no need to repleat  LDL 183   HgbA1c 5.5   VTE prophylaxis - Lovenox 40 mg sq daily   No antithrombotic prior to admission, now on aspirin 300 mg suppository daily. changed to DAPT, continue x 3 weeks then aspirin alone    Therapy recommendations:  pending   Disposition:  pending   Carotid Stenosis  R ICA origin 50% in 2019  Hypertension  Stable . Permissive hypertension (OK if < 220/120) but gradually normalize in 5-7 days . Long-term BP goal normotensive  Hyperlipidemia  Home meds:  No statin  LDL 183 , goal < 70  Add lipitor 80  Continue statin at discharge  Dysphagia . Secondary to stroke . Di honey thick liquids . Speech on board  Other Stroke Risk Factors  Advanced age  Hx stroke/TIA  Reported w/ no deficits. 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  Also w/ falls, orthostatic. Has incontinence past few months. 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 Has appt w/ neuro next week to consider shunt, do LP. Dr. Pearlean Brownie agrees w/ Dx  Hospital day # 0 She presented with mild left facial weakness and too mild to treat symptoms and deficits for TPA.  Recommend speech therapy for swallow eval and aspirin and continue ongoing stroke work-up and aggressive risk factor modification.  Brain imaging shows ventricle enlargement disproportionate to the cortical atrophy raising concern for normal pressure hydrocephalus however neuropsych testing has not shown significant dementia and only mild cognitive impairment which is age-appropriate.  Long discussion with patient and sister and answered questions.  Greater than 50% time with this 35-minute visit was spent in counseling and coordination of care about lacunar stroke and NPH and answering questions.  Discussed  with Dr. Stevphen Meuse. Delia Heady, MD  To contact Stroke Continuity provider, please refer to [email or web address]. After hours, contact General Neurology

## 2020-04-29 NOTE — ED Notes (Signed)
Report called to floor

## 2020-04-29 NOTE — ED Notes (Signed)
Got patient off the bedpan then placed a external cath on patient pt is resting with call bell in reach

## 2020-04-29 NOTE — Progress Notes (Signed)
EEG complete - results pending 

## 2020-04-29 NOTE — Progress Notes (Signed)
Arrived to room for EEG pt not in room at this time will check back later for EEG as schedule permits

## 2020-04-29 NOTE — Progress Notes (Signed)
Modified Barium Swallow Progress Note  Patient Details  Name: Kristin Rush MRN: [id number] Date of Birth: 1941-02-12  Today's Date: 04/29/2020  Modified Barium Swallow completed.  Full report located under Chart Review in the Imaging Section.  Brief recommendations include the following:  Clinical Impression  Novel moderate oropharyngeal dysphagia  exhibited with sensory and motor impairments s/p right corona radiata territory CVA. Left sided oral motor deficits included decreased lingual, labial, facial strength allowing for intermittent left sided anterior spillage, delayed AP transit, oral residuals, prolonged mastication of solid POs, and stasis/lack of propulsion of barium tablet; this was subsequently extracted. Pharyngeal deficits marked by reduced timing and effiiency of laryngeal vestibule closure, incomplete epiglottic deflection, weakned pharyngeal stripping wave, reduced base of tongue retraction, decreased laryngeal elevation, and diminished sensation. Pt with frequent pre and during the swallow penetration of thin, nectar thick liquids and episodic aspiration of thin and nectar thick liquids (inconsisently sensed). Trace penetration and trace aspiration (clearing post swallow) exhibited with honey thick liquids. Mild to moderate pharyngeal residuals noted with puree, solid PO. Recommend conservative dysphagia 1 (puree) puree, honey thick liquids with medicines crushed and safe swallowing precauitons including full supervsion. Pt with waxing/waning mentation. SLP to follow up.    Swallow Evaluation Recommendations       SLP Diet Recommendations: Dysphagia 1 (Puree) solids;Honey thick liquids   Liquid Administration via: Cup   Medication Administration: Crushed with puree   Supervision: Full supervision/cueing for compensatory strategies;Staff to assist with self feeding   Compensations: Slow rate;Small sips/bites;Effortful swallow;Multiple dry swallows after each  bite/sip;Lingual sweep for clearance of pocketing;Clear throat after each swallow   Postural Changes: Seated upright at 90 degrees;Remain semi-upright after after feeds/meals (Comment)   Oral Care Recommendations: Oral care BID   Other Recommendations: Order thickener from pharmacy    [name] MA, CCC-SLP  04/29/2020,11:47 AM

## 2020-04-29 NOTE — ED Notes (Signed)
Pt A&O x4 able to answer all questions appropriately and commands appropriately, although pt appears to be impulsive and using inappropriate judgement at this time. Pt removed all leads, removed purewick in place for elimination, removed IV from L AC pt states " I took all this off because I thought I was helping." Pt provided hygiene care and fresh linens and assisted in repositioning self in bed. R AC IV reinforced with coban. Pt provided education on need for cardiac monitoring to remain in place, pt verbalized understanding. Pt cooperative at this time, lying in bed with eyes closed resting peacefully.

## 2020-04-29 NOTE — Procedures (Signed)
Patient Name: [name]  MRN: [id number]  Epilepsy Attending: Charlsie Quest  Referring Physician/Provider: Dr. Syliva Overman Date: 04/29/2020 Duration: 24.27 minutes  Patient history: 79 year old female with episodes of staring off and inability to move briefly.  EEG to evaluate for seizures.  Level of alertness: Awake  AEDs during EEG study: None  Technical aspects: This EEG study was done with scalp electrodes positioned according to the 10-20 International system of electrode placement. Electrical activity was acquired at a sampling rate of 500Hz  and reviewed with a high frequency filter of 70Hz  and a low frequency filter of 1Hz . EEG data were recorded continuously and digitally stored.   Description: No posterior dominant rhythm was seen.  EEG showed continuous generalized polymorphic 5 to 6 Hz theta slowing as well as intermittent generalized 2 to 3 Hz delta slowing.  Hyperventilation and photic stimulation were not performed.     ABNORMALITY - Continuous slow, generalized  IMPRESSION: This study is suggestive of moderate diffuse encephalopathy, nonspecific to etiology.  No seizures or epileptiform discharges were seen throughout the recording.  Giannamarie Paulus 

## 2020-04-29 NOTE — Evaluation (Signed)
Clinical/Bedside Swallow Evaluation Patient Details  Name: Kristin Rush MRN: [id number] Date of Birth: 1940/11/06  Today's Date: 04/29/2020 Time: SLP Start Time (ACUTE ONLY): 7412 SLP Stop Time (ACUTE ONLY): 0900 SLP Time Calculation (min) (ACUTE ONLY): 22 min  Past Medical History:  Past Medical History:  Diagnosis Date  . 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 Lumbar spondylosis 02/18/2018  . Migraine   . Migraines 10/09/2011  . Orthostatic hypotension 10/23/2016  . Osteoporosis 04/16/2014  . Primary osteoarthritis of both hands 07/11/2017   Past Surgical History: History reviewed. No pertinent surgical history. HPI:  79 y.o. female with history of recently admission for recurrent syncope with orthostatic hypotension, CHF was brought to the ER after patient was found to have a left facial droop noticed around 3 PM.  Patient as per the patient's sister has not been doing well for the last couple of days with difficulty ambulating.  Has been found to have frequent episodes of low blood pressure. MRI of head revealed Acute infarct in the right corona radiata and deep white matter   Assessment / Plan / Recommendation Clinical Impression  Pt presents with concern for novel oropharyngeal dysphagia s/p acute CVA. Sister at bedside who affirms acute difficulty with swallowing leading up to hospitalization. Left sided oral motor deficits include facial droop, reduced strength and ROM of facial, lingual, and labial musculature. This allowed for reduced labial seal, prolonged oral transit, and reduced bolus control. Isolated overt weaker cough exhibited following small cup sip of thin liquids concerning for poor airway protection. Reduced laryngeal elevation exhibited per palpation. Recommend proceed with instrumental assessment prior to diet initiation. Continue NPO, oral care QID.   SLP Visit Diagnosis: Dysphagia, unspecified (R13.10)     Aspiration Risk  Moderate aspiration risk;Severe aspiration risk    Diet Recommendation   NPO  Medication Administration: Via alternative means    Other  Recommendations Oral Care Recommendations: Oral care QID   Follow up Recommendations        Frequency and Duration min 2x/week  1 week       Prognosis Prognosis for Safe Diet Advancement: Good Barriers to Reach Goals: Time post onset;Severity of deficits      Swallow Study   General Date of Onset: 04/28/20 HPI: 79 y.o. female with history of recently admission for recurrent syncope with orthostatic hypotension, CHF was brought to the ER after patient was found to have a left facial droop noticed around 3 PM.  Patient as per the patient's sister has not been doing well for the last couple of days with difficulty ambulating.  Has been found to have frequent episodes of low blood pressure. MRI of head revealed Acute infarct in the right corona radiata and deep white matter Type of Study: Bedside Swallow Evaluation Previous Swallow Assessment: none on file Diet Prior to this Study: NPO Temperature Spikes Noted: No Respiratory Status: Room air History of Recent Intubation: No Behavior/Cognition: Lethargic/Drowsy;Distractible;Requires cueing Oral Cavity Assessment: Dry Oral Cavity - Dentition: Adequate natural dentition Self-Feeding Abilities: Needs assist Patient Positioning: Upright in bed Baseline Vocal Quality: Low vocal intensity;Hoarse Volitional Swallow: Unable to elicit    Oral/Motor/Sensory Function Overall Oral Motor/Sensory Function: Moderate impairment Facial ROM: Reduced left;Suspected CN VII (facial) dysfunction Facial Symmetry: Abnormal symmetry left;Suspected CN VII (facial) dysfunction Facial Strength: Reduced left;Suspected CN VII (facial) dysfunction Facial Sensation: Suspected CN V (Trigeminal) dysfunction;Reduced left Lingual ROM: Reduced left;Suspected CN XII (hypoglossal) dysfunction Lingual Strength:  Suspected CN XII (  hypoglossal) dysfunction;Reduced   Ice Chips Ice chips: Impaired Presentation: Spoon Oral Phase Impairments: Reduced lingual movement/coordination Oral Phase Functional Implications: Prolonged oral transit Pharyngeal Phase Impairments: Suspected delayed Swallow;Multiple swallows;Decreased hyoid-laryngeal movement   Thin Liquid Thin Liquid: Impaired Presentation: Cup Oral Phase Impairments: Reduced labial seal Oral Phase Functional Implications: Prolonged oral transit Pharyngeal  Phase Impairments: Suspected delayed Swallow;Multiple swallows;Cough - Immediate;Cough - Delayed    Nectar Thick Nectar Thick Liquid: Not tested   Honey Thick Honey Thick Liquid: Not tested   Puree Puree: Impaired Presentation: Spoon Oral Phase Impairments: Reduced lingual movement/coordination Oral Phase Functional Implications: Prolonged oral transit Pharyngeal Phase Impairments: Suspected delayed Swallow;Decreased hyoid-laryngeal movement;Multiple swallows   Solid     Solid: Not tested      Mitzie Marlar E Latreshia Beauchaine MA, CCC-SLP Acute Rehabilitation Services  04/29/2020,9:07 AM

## 2020-04-29 NOTE — Progress Notes (Signed)
PROGRESS NOTE  Kristin Rush WER:[id number] DOB: June 19, 1941 DOA: 04/28/2020 PCP: Eartha Inch, MD   LOS: 0 days   Brief Narrative / Interim history: 79 year old female with recent admission for recurrent syncope with orthostatic hypotension, very mild cognitive difficulties, chronic diastolic CHF, who lives with her sister who is being brought to the hospital after sister mentioned that she has had left facial droop yesterday afternoon.  Patient sister also tells me that she has had difficulties moving her legs and reports that almost on a daily basis the patient has some spells in which she stares in space and is unable to move for short period of time.  Since discharge last month patient has been largely bedbound.  In the ED an MRI of the brain confirmed acute stroke involving the right corona radiata, also showed hydrocephalus.  Neurology consulted  Subjective / 24h Interval events: Denies any complaints this morning, when asked about why she is here she says because of old age  Assessment & Plan: Principal Problem Acute CVA-MRI confirms acute stroke involving the right corona radiata.  Neurology consulted and following, currently undergoing stroke work-up.  A 2D echo was recently done on 03/17/2020 during her admission and showed normal EF 60-65% and grade 2 diastolic dysfunction.  CT angiogram of the head and neck without significant carotid or vertebral artery stenosis.  Due to spells reported by sister will obtain an EEG -Hemoglobin A1c, lipid panel pending -Speech evaluated patient under concern for new oropharyngeal dysphagia with acute CVA and recommended n.p.o. right now.  Continue to monitor progress. -PT/OT recommendations pending  Active Problems History of orthostatic hypotension-blood pressure up to 150s-160s, hold midodrine  Chronic diastolic CHF-appears compensated  Hydrocephalus seen on MRI-patient sister has multiple questions for me today regarding this, neurology  to see  Cognitive difficulties-could be from hydrocephalus, awaiting neurology input  Scheduled Meds: .  stroke: mapping our early stages of recovery book   Does not apply Once  . aspirin  300 mg Rectal Daily   Or  . aspirin  325 mg Oral Daily  . enoxaparin (LOVENOX) injection  40 mg Subcutaneous Daily   Continuous Infusions: . sodium chloride     PRN Meds:.acetaminophen **OR** acetaminophen (TYLENOL) oral liquid 160 mg/5 mL **OR** acetaminophen, hydrALAZINE    DVT prophylaxis: enoxaparin (LOVENOX) injection 40 mg Start: 04/29/20 1000     Code Status: DNR  Family Communication: sister at bedside  Status is: Observation  The patient will require care spanning > 2 midnights and should be moved to inpatient because: Inpatient level of care appropriate due to severity of illness  Dispo: The patient is from: Home              Anticipated d/c is to: TBD              Anticipated d/c date is: 2 days              Patient currently is not medically stable to d/c.  Consultants:  Neurology   Procedures:  2D echo  Microbiology  None   Antimicrobials: None     Objective: Vitals:   04/29/20 0346 04/29/20 0507 04/29/20 0601 04/29/20 0700  BP: 120/67 137/65 (!) 156/79 140/76  Pulse: 74 79 82 79  Resp: 15 19 19 15   Temp:      TempSrc:      SpO2: 91% 97% 99% 95%  Weight:       No intake or output data in the 24 hours  ending 04/29/20 1027 Filed Weights   04/28/20 1600  Weight: 59.1 kg    Examination:  Constitutional: NAD Eyes: no scleral icterus ENMT: Mucous membranes are moist.  Neck: normal, supple Respiratory: clear to auscultation bilaterally, no wheezing, no crackles. Cardiovascular: Regular rate and rhythm, no murmurs / rubs / gallops. Abdomen: non distended, no tenderness. Bowel sounds positive.  Musculoskeletal: no clubbing / cyanosis.  Skin: no rashes Neurologic: Facial droop present, strength overall equal   Data Reviewed: I have independently  reviewed following labs and imaging studies   CBC: Recent Labs  Lab 04/28/20 1649 04/28/20 1651  WBC  --  4.3  NEUTROABS  --  2.4  HGB 12.6 12.3  HCT 37.0 37.5  MCV  --  94.2  PLT  --  158   Basic Metabolic Panel: Recent Labs  Lab 04/28/20 1649 04/28/20 1651  NA 137 136  K 4.3 4.4  CL 97* 100  CO2  --  28  GLUCOSE 87 91  BUN 13 12  CREATININE 1.00 0.92  CALCIUM  --  9.3   Liver Function Tests: Recent Labs  Lab 04/28/20 1651  AST 27  ALT 23  ALKPHOS 88  BILITOT 0.7  PROT 6.2*  ALBUMIN 3.6   Coagulation Profile: Recent Labs  Lab 04/28/20 1651  INR 0.9   HbA1C: No results for input(s): HGBA1C in the last 72 hours. CBG: Recent Labs  Lab 04/28/20 1641  GLUCAP 83    Recent Results (from the past 240 hour(s))  SARS Coronavirus 2 by RT PCR (hospital order, performed in [facility]) Nasopharyngeal Nasopharyngeal Swab     Status: None   Collection Time: 04/28/20  8:12 PM   Specimen: Nasopharyngeal Swab  Result Value Ref Range Status   SARS Coronavirus 2 NEGATIVE NEGATIVE Final    Comment: (NOTE) SARS-CoV-2 target nucleic acids are NOT DETECTED.  The SARS-CoV-2 RNA is generally detectable in upper and lower respiratory specimens during the acute phase of infection. The lowest concentration of SARS-CoV-2 viral copies this assay can detect is 250 copies / mL. A negative result does not preclude SARS-CoV-2 infection and should not be used as the sole basis for treatment or other patient management decisions.  A negative result may occur with improper specimen collection / handling, submission of specimen other than nasopharyngeal swab, presence of viral mutation(s) within the areas targeted by this assay, and inadequate number of viral copies (<250 copies / mL). A negative result must be combined with clinical observations, patient history, and epidemiological information.  Fact Sheet for Patients:    BoilerBrush.com.cyhttps://www.fda.gov/media/136312/download  Fact Sheet for Healthcare Providers: https://pope.com/https://www.fda.gov/media/136313/download  This test is not yet approved or  cleared by the [place] FDA and has been authorized for detection and/or diagnosis of SARS-CoV-2 by FDA under an Emergency Use Authorization (EUA).  This EUA will remain in effect (meaning this test can be used) for the duration of the COVID-19 declaration under Section 564(b)(1) of the Act, 21 U.S.C. section 360bbb-3(b)(1), unless the authorization is terminated or revoked sooner.  Performed at [facility], [address] [place], [place] [address]      Radiology Studies: CT Code Stroke CTA Head W/WO contrast  Result Date: 04/28/2020 CLINICAL DATA:  Focal neuro deficit greater than 6 hours. Isolated facial droop. EXAM: CT ANGIOGRAPHY HEAD AND NECK TECHNIQUE: Multidetector CT imaging of the head and neck was performed using the standard protocol during bolus administration of intravenous contrast. Multiplanar CT image reconstructions and MIPs  were obtained to evaluate the vascular anatomy. Carotid stenosis measurements (when applicable) are obtained utilizing NASCET criteria, using the distal internal carotid diameter as the denominator. CONTRAST:  13mL OMNIPAQUE IOHEXOL 350 MG/ML SOLN COMPARISON:  CT head 04/28/2020.  MRI head 04/03/2020 FINDINGS: CTA NECK FINDINGS Aortic arch: Mild atherosclerotic calcification aortic arch. Bovine branching pattern. Proximal great vessels widely patent. Right carotid system: Mild noncalcified plaque in the right common carotid artery artery. Atherosclerotic calcification in the right carotid bulb with approximately 25% diameter stenosis of the right internal carotid artery. Right external carotid artery widely patent. Left carotid system: Left common carotid artery widely patent. Mild atherosclerotic calcification left carotid bifurcation without significant stenosis. Vertebral arteries:  Right vertebral artery dominant and patent to the basilar without stenosis. Small left vertebral artery which is hypoplastic distal to PICA. Small contribution to the basilar. Skeleton: Cervical spondylosis.  No acute skeletal abnormality. Other neck: Negative for mass or adenopathy Upper chest: Apical pleural scarring bilaterally. Lung apices clear bilaterally. Review of the MIP images confirms the above findings CTA HEAD FINDINGS Anterior circulation: Atherosclerotic calcification in the cavernous carotid bilaterally without stenosis. Anterior and middle cerebral arteries patent bilaterally without large vessel occlusion. Mild atherosclerotic irregularity in the M3 branches bilaterally Posterior circulation: Both vertebral arteries contribute to the basilar with right vertebral artery dominant. PICA patent bilaterally. Basilar widely patent. Superior cerebellar and posterior cerebral arteries patent bilaterally. Fetal origin right posterior cerebral artery. Venous sinuses: Minimal venous contrast due to arterial phase scanning Anatomic variants: None Review of the MIP images confirms the above findings IMPRESSION: 1. No significant carotid or vertebral artery stenosis in the neck. Mild atherosclerotic disease. 2. Negative for intracranial large vessel occlusion 3. Mild atherosclerotic irregularity in the M3 branches bilaterally. Electronically Signed   By: Marlan Palau M.D.   On: 04/28/2020 17:14   CT Code Stroke CTA Neck W/WO contrast  Result Date: 04/28/2020 CLINICAL DATA:  Focal neuro deficit greater than 6 hours. Isolated facial droop. EXAM: CT ANGIOGRAPHY HEAD AND NECK TECHNIQUE: Multidetector CT imaging of the head and neck was performed using the standard protocol during bolus administration of intravenous contrast. Multiplanar CT image reconstructions and MIPs were obtained to evaluate the vascular anatomy. Carotid stenosis measurements (when applicable) are obtained utilizing NASCET criteria, using  the distal internal carotid diameter as the denominator. CONTRAST:  66mL OMNIPAQUE IOHEXOL 350 MG/ML SOLN COMPARISON:  CT head 04/28/2020.  MRI head 04/03/2020 FINDINGS: CTA NECK FINDINGS Aortic arch: Mild atherosclerotic calcification aortic arch. Bovine branching pattern. Proximal great vessels widely patent. Right carotid system: Mild noncalcified plaque in the right common carotid artery artery. Atherosclerotic calcification in the right carotid bulb with approximately 25% diameter stenosis of the right internal carotid artery. Right external carotid artery widely patent. Left carotid system: Left common carotid artery widely patent. Mild atherosclerotic calcification left carotid bifurcation without significant stenosis. Vertebral arteries: Right vertebral artery dominant and patent to the basilar without stenosis. Small left vertebral artery which is hypoplastic distal to PICA. Small contribution to the basilar. Skeleton: Cervical spondylosis.  No acute skeletal abnormality. Other neck: Negative for mass or adenopathy Upper chest: Apical pleural scarring bilaterally. Lung apices clear bilaterally. Review of the MIP images confirms the above findings CTA HEAD FINDINGS Anterior circulation: Atherosclerotic calcification in the cavernous carotid bilaterally without stenosis. Anterior and middle cerebral arteries patent bilaterally without large vessel occlusion. Mild atherosclerotic irregularity in the M3 branches bilaterally Posterior circulation: Both vertebral arteries contribute to the basilar with right vertebral artery dominant.  PICA patent bilaterally. Basilar widely patent. Superior cerebellar and posterior cerebral arteries patent bilaterally. Fetal origin right posterior cerebral artery. Venous sinuses: Minimal venous contrast due to arterial phase scanning Anatomic variants: None Review of the MIP images confirms the above findings IMPRESSION: 1. No significant carotid or vertebral artery stenosis in  the neck. Mild atherosclerotic disease. 2. Negative for intracranial large vessel occlusion 3. Mild atherosclerotic irregularity in the M3 branches bilaterally. Electronically Signed   By: Marlan Palau M.D.   On: 04/28/2020 17:14   MR BRAIN WO CONTRAST  Result Date: 04/28/2020 CLINICAL DATA:  Acute neuro deficit. EXAM: MRI HEAD WITHOUT CONTRAST TECHNIQUE: Multiplanar, multiecho pulse sequences of the brain and surrounding structures were obtained without intravenous contrast. COMPARISON:  MRI head 04/03/2020.  CT angio head and neck 04/28/2020 FINDINGS: Brain: Acute infarct in the right Corona radiata extending into the deep white matter tracks. No other area of acute infarct. Mild chronic microvascular ischemic change in the white matter Mild to moderate ventricular enlargement is unchanged. Ventricular enlargement is greater than the level of atrophy suggesting hydrocephalus. Third fourth and lateral ventricles all dilated. Negative for intracranial hemorrhage or mass lesion. Vascular: Normal arterial flow voids. Skull and upper cervical spine: No focal skeletal lesion. Sinuses/Orbits: Paranasal sinuses clear.  Negative orbit. Other: Multilocular cyst anterior to the C2 vertebral body on the left measuring approximately 24 x 8 mm. This appears to be within the longus coli muscle on the left. No change from the recent MRI. IMPRESSION: Acute infarct in the right corona radiata and deep white matter tracks. Mild to moderate ventricular enlargement compatible with hydrocephalus. 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 Ventricle size stable since the recent MRI of 04/03/2020. 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 Uncertain etiology. Possible chronic tendonitis. Electronically Signed   By: Marlan Palau M.D.   On: 04/28/2020 18:39   CT HEAD CODE STROKE WO CONTRAST  Result Date: 04/28/2020 CLINICAL DATA:  Code stroke.  Facial droop. EXAM: CT HEAD WITHOUT CONTRAST  TECHNIQUE: Contiguous axial images were obtained from the base of the skull through the vertex without intravenous contrast. COMPARISON:  Head MRI 04/03/2020 FINDINGS: Brain: There is no evidence of an acute infarct, intracranial hemorrhage, mass, midline shift, or extra-axial fluid collection. Hypodensities in the cerebral white matter bilaterally are nonspecific but compatible with mild chronic small vessel ischemic disease. A chronic lacunar infarct is again noted in the right thalamus. Mild ventriculomegaly is unchanged and out of proportion to the size of the sulci and may reflect central predominant cerebral atrophy or normal pressure hydrocephalus. Vascular: Calcified atherosclerosis at the skull base. No hyperdense vessel. Skull: No fracture or suspicious osseous lesion. Sinuses/Orbits: Visualized paranasal sinuses and mastoid air cells are clear. Orbits are unremarkable. Other: None. ASPECTS [facility] Stroke Program Early CT Score) - Ganglionic level infarction (caudate, lentiform nuclei, internal capsule, insula, M1-M3 cortex): 7 - Supraganglionic infarction (M4-M6 cortex): 3 Total score (0-10 with 10 being normal): 10 IMPRESSION: 1. No evidence of acute intracranial abnormality. 2. ASPECTS is 10. 3. Mild chronic small vessel ischemic disease. These results were communicated to Dr. Amada Jupiter at 4:57 pm on 04/28/2020 by text page via the [facility] messaging system. Electronically Signed   By: Sebastian Ache M.D.   On: 04/28/2020 16:58   Pamella Pert, MD, PhD [facility]  Between 7 am - 7 pm I am available, please contact me via Amion or Securechat  Between 7 pm - 7 am I am not available,  please contact night coverage MD/APP via Amion

## 2020-04-30 MED ORDER — MIDODRINE HCL 5 MG PO TABS
5.0000 mg | ORAL_TABLET | Freq: Three times a day (TID) | ORAL | Status: DC
  Administered 2020-04-30 – 2020-05-02 (×8): 5 mg via ORAL
  Administered 2020-05-03: 10 mg via ORAL
  Filled 2020-04-30 (×7): qty 1

## 2020-04-30 MED ORDER — SODIUM CHLORIDE 0.9 % IV BOLUS
500.0000 mL | Freq: Once | INTRAVENOUS | Status: AC
  Administered 2020-04-30: 500 mL via INTRAVENOUS

## 2020-04-30 MED ORDER — ALPRAZOLAM 0.25 MG PO TABS
0.2500 mg | ORAL_TABLET | Freq: Two times a day (BID) | ORAL | Status: DC | PRN
  Administered 2020-04-30 – 2020-05-04 (×5): 0.25 mg via ORAL
  Filled 2020-04-30 (×5): qty 1

## 2020-04-30 NOTE — Evaluation (Signed)
Occupational Therapy Evaluation Patient Details Name: Kristin Rush MRN: [id number] DOB: 1941/04/25 Today's Date: 04/30/2020    History of Present Illness Kristin Rush is a 79 y.o. female who was noted to develop facial droop sometime after 3 PM.  She has a history of stroke with no residual deficits as well as memory loss.recently admission for recurrent syncope with orthostatic hypotension, CHF.Since last discharge last month patient has been largely bedbound as per the patient's sister. 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   Clinical Impression   This 79 yo female admitted with above presents to acute OT with PLOF of needing A for some basic ADLs and transfers and now needing increased A for these activities with new left sided weakness and left inattention (possible field cut). She will benefit from acute OT with follow up OT at SNF.    Follow Up Recommendations  SNF;Supervision/Assistance - 24 hour    Equipment Recommendations  Other (comment) (TBD next venue)       Precautions / Restrictions Precautions Precautions: Fall Precaution Comments: orthostatic, L neglect/inattention, R-sided gaze Restrictions Weight Bearing Restrictions: No      Mobility Bed Mobility Overal bed mobility: Needs Assistance Bed Mobility: Supine to Sit     Supine to sit: Min assist (with Mod A to scoot to EOB)    Transfers Overall transfer level: Needs assistance Equipment used: Rolling walker (2 wheeled) Transfers: Sit to/from [employer] Sit to Stand: Mod assist Stand pivot transfers: Mod assist       General transfer comment: posterior lean, shuffling steps, close to sitting prematurely in chair on her left    Balance Overall balance assessment: Needs assistance Sitting-balance support: Bilateral upper  extremity supported;Feet supported Sitting balance-Leahy Scale: Poor Sitting balance - Comments: posterior lean, sometimes she could correct with cues other times she needed A to correct Postural control: Posterior lean Standing balance support: Bilateral upper extremity supported Standing balance-Leahy Scale: Poor Standing balance comment: BUE support on RW and addtional support of therapist                           ADL either performed or assessed with clinical judgement   ADL Overall ADL's : Needs assistance/impaired Eating/Feeding: Moderate assistance Eating/Feeding Details (indicate cue type and reason): supported sitting Grooming: Maximal assistance Grooming Details (indicate cue type and reason): supported sitting Upper Body Bathing: Maximal assistance Upper Body Bathing Details (indicate cue type and reason): supported sitting Lower Body Bathing: Maximal assistance Lower Body Bathing Details (indicate cue type and reason): Mod A sit<>stand (posterior lean) Upper Body Dressing : Total assistance Upper Body Dressing Details (indicate cue type and reason): supported sitting Lower Body Dressing: Total assistance Lower Body Dressing Details (indicate cue type and reason): Mod A sit<>stand (posterior lean) Toilet Transfer: Moderate assistance;Stand-pivot;RW Toilet Transfer Details (indicate cue type and reason): bed>recliner (posterior lean, shuffling steps, almost sitting prematurely) Toileting- Clothing Manipulation and Hygiene: Total assistance Toileting - Clothing Manipulation Details (indicate cue type and reason): Mod A sit<>stand (posterior lean)             Vision Baseline Vision/History: Wears glasses Wears Glasses: Reading only Vision Assessment?: Vision impaired- to be further tested in functional context Additional Comments: left inattention possible field deficit as well, trouble tracking in all directions and counting fingers even right of midline  Pertinent Vitals/Pain Pain Assessment: No/denies pain      Hand Dominance Right   Extremity/Trunk Assessment Upper Extremity Assessment Upper Extremity Assessment: LUE deficits/detail LUE Deficits / Details: lag compared to shoulder flexion on right, decreased coordination LUE Coordination: decreased fine motor;decreased gross motor     Communication Communication Communication:  (slow to respond at times)   Cognition Arousal/Alertness: Awake/alert Behavior During Therapy: Flat affect Overall Cognitive Status: Impaired/Different from baseline Area of Impairment: Following commands;Problem solving;Memory;Safety/judgement;Orientation                 Orientation Level: Disoriented to;Place;Time;Situation (didn't know where she was even with choices, got month but not year (2001 then self corrected to  2010)) Current Attention Level: Focused Memory: Decreased short-term memory Following Commands: Follows one step commands inconsistently Safety/Judgement: Decreased awareness of safety;Decreased awareness of deficits   Problem Solving: Decreased initiation;Difficulty sequencing;Requires verbal cues General Comments: Pt more awake than when PT saw her earlier, definite left inattention possibly field cut              Home Living Family/patient expects to be discharged to:: Skilled nursing facility Living Arrangements: Spouse/significant other Available Help at Discharge: Family;Friend(s);Available 24 hours/day (husband and multiple sisters to assist pt) Type of Home: House       Home Layout: One level     Bathroom Shower/Tub: Producer, television/film/video: Standard     Home Equipment: Environmental consultant - 2 wheels;Walker - 4 wheels;Cane - single point;Grab bars - toilet;Tub bench;Grab bars - tub/shower;Hand held shower head      Lives With:  (alone (but sister lives in her basement apartment), husband lives in house next door)    Prior  Functioning/Environment Level of Independence: Needs assistance  Gait / Transfers Assistance Needed: Pt ambulating with RW and assist of at least 1 person for all mobility. Was receiving HHPT PTA, was instructed not to stand without assist ADL's / Homemaking Assistance Needed: pt receives assist to dress (bra, socks shoes), sits on shower bench for showers with assist of sisters to wash (she can wash UB), uses BSC at bedside and toilet riser in bathroom with A            OT Problem List: Decreased strength;Decreased range of motion;Impaired balance (sitting and/or standing);Impaired vision/perception;Impaired UE functional use;Decreased cognition;Decreased coordination;Decreased safety awareness      OT Treatment/Interventions: Self-care/ADL training;DME and/or AE instruction;Patient/family education;Balance training;Visual/perceptual remediation/compensation;Therapeutic activities    OT Goals(Current goals can be found in the care plan section) Acute Rehab OT Goals Patient Stated Goal: none stated. Husband in agreement for rehab before back home OT Goal Formulation: With patient/family Time For Goal Achievement: 05/14/20 Potential to Achieve Goals: Good  OT Frequency: Min 2X/week              AM-PAC OT "6 Clicks" Daily Activity     Outcome Measure Help from another person eating meals?: A Lot Help from another person taking care of personal grooming?: A Lot Help from another person toileting, which includes using toliet, bedpan, or urinal?: Total Help from another person bathing (including washing, rinsing, drying)?: A Lot Help from another person to put on and taking off regular upper body clothing?: A Lot Help from another person to put on and taking off regular lower body clothing?: Total 6 Click Score: 10   End of Session Equipment Utilized During Treatment: Gait belt;Rolling walker  Activity Tolerance: Patient tolerated treatment well Patient left: in chair;with call  bell/phone within reach;with chair alarm  set;with family/visitor present  OT Visit Diagnosis: Unsteadiness on feet (R26.81);Other abnormalities of gait and mobility (R26.89);Muscle weakness (generalized) (M62.81);Other symptoms and signs involving cognitive function;Hemiplegia and hemiparesis;Low vision, 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

## 2020-04-30 NOTE — Progress Notes (Signed)
STROKE TEAM PROGRESS NOTE   INTERVAL HISTORY Her husband is at the bedside.  Patient continues to have mild facial left hand weakness.  Husband states that she does have cognitive impairment at baseline and needs a lot of help from him.  EEG shows mild diffuse slowing but no epileptiform activity.  Echocardiogram is unremarkable.  Vitals:   04/29/20 1634 04/29/20 2015 04/30/20 0018 04/30/20 0430  BP: (!) 163/78 (!) 175/97 (!) 158/81 (!) 159/109  Pulse: 92 98 86 87  Resp: 17 18 17 17   Temp: 98.6 F (37 C) 98.8 F (37.1 C) 98.7 F (37.1 C) 97.9 F (36.6 C)  TempSrc: Oral Oral Axillary Axillary  SpO2: 97% 98% 97% 100%  Weight:   58.8 kg    CBC:  Recent Labs  Lab 04/28/20 1649 04/28/20 1651  WBC  --  4.3  NEUTROABS  --  2.4  HGB 12.6 12.3  HCT 37.0 37.5  MCV  --  94.2  PLT  --  158   Basic Metabolic Panel:  Recent Labs  Lab 04/28/20 1649 04/28/20 1651  NA 137 136  K 4.3 4.4  CL 97* 100  CO2  --  28  GLUCOSE 87 91  BUN 13 12  CREATININE 1.00 0.92  CALCIUM  --  9.3   Lipid Panel:  Recent Labs  Lab 04/29/20 0817  CHOL 261*  TRIG 62  HDL 66  CHOLHDL 4.0  VLDL 12  LDLCALC 05/01/20*   HgbA1c:  Recent Labs  Lab 04/29/20 0817  HGBA1C 5.5   Urine Drug Screen:  Recent Labs  Lab 04/28/20 1649  LABOPIA NONE DETECTED  COCAINSCRNUR NONE DETECTED  LABBENZ NONE DETECTED  AMPHETMU NONE DETECTED  THCU NONE DETECTED  LABBARB NONE DETECTED    Alcohol Level  Recent Labs  Lab 04/28/20 1651  ETH <10    IMAGING past 24 hours EEG  Result Date: 04/29/2020 05/01/2020, MD     04/29/2020  2:10 PM Patient Name: Kristin Rush MRN: [facility] Attending: [id number] Referring Physician/Provider: Dr. Charlsie Quest Date: 04/29/2020 Duration: 24.27 minutes Patient history: 79 year old female with episodes of staring off and inability to move briefly.  EEG to evaluate for seizures. Level of alertness: Awake AEDs during EEG study: None Technical aspects: This  EEG study was done with scalp electrodes positioned according to the 10-20 International system of electrode placement. Electrical activity was acquired at a sampling rate of 500Hz  and reviewed with a high frequency filter of 70Hz  and a low frequency filter of 1Hz . EEG data were recorded continuously and digitally stored. Description: No posterior dominant rhythm was seen.  EEG showed continuous generalized polymorphic 5 to 6 Hz theta slowing as well as intermittent generalized 2 to 3 Hz delta slowing.  Hyperventilation and photic stimulation were not performed.   ABNORMALITY - Continuous slow, generalized IMPRESSION: This study is suggestive of moderate diffuse encephalopathy, nonspecific to etiology.  No seizures or epileptiform discharges were seen throughout the recording. 66   DG Swallowing Func-Speech Pathology  Result Date: 04/29/2020 Objective Swallowing Evaluation: Type of Study: Bedside Swallow Evaluation  Patient Details Name: Kristin Rush MRN: Date of Birth: [birth year] Today's Date: 04/29/2020 Time: SLP Start Time (ACUTE ONLY): 1000 -SLP Stop Time (ACUTE ONLY): 1025 SLP Time Calculation (min) (ACUTE ONLY): 25 min Past Medical History: Past Medical History: Diagnosis Date . 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 Lumbar spondylosis 02/18/2018 . Migraine  . Migraines  10/09/2011 . Orthostatic hypotension 10/23/2016 . Osteoporosis 04/16/2014 . Primary osteoarthritis of both hands 07/11/2017 Past Surgical History: No past surgical history on file. HPI: 79 y.o. female with history of recently admission for recurrent syncope with orthostatic hypotension, CHF was brought to the ER after patient was found to have a left facial droop noticed around 3 PM.  Patient as per the patient's sister has not been doing well for the last couple of days with difficulty ambulating.  Has been found to have frequent episodes of low blood pressure. MRI of  head revealed Acute infarct in the right corona radiata and deep white matter  Subjective: upright in chair for procedure Assessment / Plan / Recommendation CHL IP CLINICAL IMPRESSIONS 04/29/2020 Clinical Impression Novel moderate oropharyngeal dysphagia  exhibited with sensory and motor impairments s/p right corona radiata territory CVA. Left sided oral motor deficits included decreased lingual, labial, facial strength allowing for intermittent left sided anterior spillage, delayed AP transit, oral residuals, prolonged mastication of solid POs, and stasis/lack of propulsion of barium tablet; this was subsequently extracted. Pharyngeal deficits marked by reduced timing and effiiency of laryngeal vestibule closure, incomplete epiglottic deflection, weakned pharyngeal stripping wave, reduced base of tongue retraction, decreased laryngeal elevation, and diminished sensation. Pt with frequent pre and during the swallow penetration of thin, nectar thick liquids and episodic aspiration of thin and nectar thick liquids (inconsisently sensed). Trace penetration and trace aspiration (clearing post swallow) exhibited with honey thick liquids. Mild to moderate pharyngeal residuals noted with puree, solid PO. Recommend conservative dysphagia 1 (puree) puree, honey thick liquids with medicines crushed and safe swallowing precauitons including full supervsion. Pt with waxing/waning mentation. SLP to follow up.  SLP Visit Diagnosis Dysphagia, oropharyngeal phase (R13.12) Attention and concentration deficit following -- Frontal lobe and executive function deficit following -- Impact on safety and function Moderate aspiration risk;Severe aspiration risk   CHL IP TREATMENT RECOMMENDATION 04/29/2020 Treatment Recommendations Therapy as outlined in treatment plan below   Prognosis 04/29/2020 Prognosis for Safe Diet Advancement Good Barriers to Reach Goals Time post onset;Severity of deficits Barriers/Prognosis Comment -- CHL IP DIET  RECOMMENDATION 04/29/2020 SLP Diet Recommendations Dysphagia 1 (Puree) solids;Honey thick liquids Liquid Administration via Cup Medication Administration Crushed with puree Compensations Slow rate;Small sips/bites;Effortful swallow;Multiple dry swallows after each bite/sip;Lingual sweep for clearance of pocketing;Clear throat after each swallow Postural Changes Seated upright at 90 degrees;Remain semi-upright after after feeds/meals (Comment)   CHL IP OTHER RECOMMENDATIONS 04/29/2020 Recommended Consults -- Oral Care Recommendations Oral care BID Other Recommendations Order thickener from pharmacy   CHL IP FOLLOW UP RECOMMENDATIONS 04/29/2020 Follow up Recommendations Other (comment)   CHL IP FREQUENCY AND DURATION 04/29/2020 Speech Therapy Frequency (ACUTE ONLY) min 2x/week Treatment Duration 1 week      CHL IP ORAL PHASE 04/29/2020 Oral Phase Impaired Oral - Pudding Teaspoon -- Oral - Pudding Cup -- Oral - Honey Teaspoon -- Oral - Honey Cup Weak lingual manipulation;Left anterior bolus loss;Incomplete tongue to palate contact;Reduced posterior propulsion;Holding of bolus;Lingual/palatal residue;Delayed oral transit Oral - Nectar Teaspoon -- Oral - Nectar Cup Left anterior bolus loss;Weak lingual manipulation;Incomplete tongue to palate contact;Left pocketing in lateral sulci;Lingual/palatal residue;Delayed oral transit;Holding of bolus;Reduced posterior propulsion Oral - Nectar Straw -- Oral - Thin Teaspoon -- Oral - Thin Cup Delayed oral transit;Weak lingual manipulation;Lingual/palatal residue Oral - Thin Straw Weak lingual manipulation;Lingual/palatal residue Oral - Puree Delayed oral transit;Weak lingual manipulation;Reduced posterior propulsion;Lingual/palatal residue Oral - Mech Soft Delayed oral transit;Piecemeal swallowing;Lingual/palatal residue;Reduced posterior propulsion;Left pocketing in lateral sulci;Impaired  mastication;Weak lingual manipulation;Decreased bolus cohesion Oral - Regular -- Oral -  Multi-Consistency -- Oral - Pill Other (Comment);Holding of bolus Oral Phase - Comment --  CHL IP PHARYNGEAL PHASE 04/29/2020 Pharyngeal Phase Impaired Pharyngeal- Pudding Teaspoon -- Pharyngeal -- Pharyngeal- Pudding Cup -- Pharyngeal -- Pharyngeal- Honey Teaspoon Pharyngeal residue - valleculae;Pharyngeal residue - pyriform;Penetration/Aspiration before swallow;Penetration/Aspiration during swallow;Reduced laryngeal elevation;Reduced epiglottic inversion;Reduced pharyngeal peristalsis Pharyngeal Material enters airway, remains ABOVE vocal cords then ejected out;Material does not enter airway;Material enters airway, passes BELOW cords then ejected out Pharyngeal- Honey Cup -- Pharyngeal -- Pharyngeal- Nectar Teaspoon -- Pharyngeal -- Pharyngeal- Nectar Cup Reduced tongue base retraction;Reduced airway/laryngeal closure;Reduced epiglottic inversion;Penetration/Aspiration during swallow;Penetration/Aspiration before swallow;Pharyngeal residue - valleculae;Pharyngeal residue - pyriform;Delayed swallow initiation-pyriform sinuses;Reduced pharyngeal peristalsis;Reduced laryngeal elevation Pharyngeal Material enters airway, remains ABOVE vocal cords then ejected out;Material enters airway, CONTACTS cords and not ejected out;Material enters airway, passes BELOW cords then ejected out Pharyngeal- Nectar Straw -- Pharyngeal -- Pharyngeal- Thin Teaspoon -- Pharyngeal -- Pharyngeal- Thin Cup Penetration/Aspiration during swallow;Reduced airway/laryngeal closure;Reduced pharyngeal peristalsis;Reduced epiglottic inversion;Reduced laryngeal elevation;Reduced tongue base retraction;Delayed swallow initiation-pyriform sinuses;Pharyngeal residue - valleculae;Pharyngeal residue - pyriform;Penetration/Aspiration before swallow Pharyngeal Material enters airway, remains ABOVE vocal cords then ejected out;Material enters airway, CONTACTS cords and not ejected out Pharyngeal- Thin Straw Reduced tongue base  retraction;Penetration/Aspiration before swallow;Reduced epiglottic inversion;Delayed swallow initiation-pyriform sinuses;Reduced airway/laryngeal closure;Reduced pharyngeal peristalsis;Pharyngeal residue - valleculae;Pharyngeal residue - pyriform Pharyngeal Material enters airway, passes BELOW cords without attempt by patient to eject out (silent aspiration) Pharyngeal- Puree Delayed swallow initiation-vallecula;Reduced laryngeal elevation;Reduced tongue base retraction;Reduced epiglottic inversion;Reduced pharyngeal peristalsis;Pharyngeal residue - pyriform;Pharyngeal residue - valleculae Pharyngeal -- Pharyngeal- Mechanical Soft Delayed swallow initiation-vallecula;Reduced laryngeal elevation;Reduced pharyngeal peristalsis;Reduced epiglottic inversion;Reduced tongue base retraction;Pharyngeal residue - valleculae;Pharyngeal residue - pyriform Pharyngeal -- Pharyngeal- Regular -- Pharyngeal -- Pharyngeal- Multi-consistency -- Pharyngeal -- Pharyngeal- Pill Other (Comment) Pharyngeal -- Pharyngeal Comment --  No flowsheet data found. Chelsea E Hartness MA, CCC-SLP 04/29/2020, 11:46 AM               PHYSICAL EXAM  Pleasant elderly Caucasian lady not in distress. . Afebrile. Head is nontraumatic. Neck is supple without bruit.    Cardiac exam no murmur or gallop. Lungs are clear to auscultation. Distal pulses are well felt. Neurological Exam :  Awake alert oriented to time place and person.  Intact attention, registration and recall.  Speech and language apparent normal except for mild dysarthria.  Extraocular movements are full range without nystagmus.  Blinks to threat bilaterally.  Left lower facial weakness.  Tongue midline.  Motor system exam reveals no upper or lower extremity drift but diminished fine finger movements on the left and orbits right over left upper extremity.  Symmetric lower extremity strength.  Sensation is normal.  Coordination is accurate.  Gait not tested.   ASSESSMENT/PLAN Kristin Rush is a 79 y.o. female with history of history of stroke with no residual deficits as well as memory loss, migraine presenting with facial droop. Too mild for tPA.   Stroke:   R corona radiata infarct secondary to small vessel disease source  Code Stroke CT head No acute abnormality. Small vessel disease. ASPECTS 10.     CTA head no LVO. Mild B M3 atherosclerosis   CTA neck mild atherosclerosis   MRI  R corona radiata and deep white matter infarct. 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 Multilocal cyst L longus colli muscle stable.   2D Echo 03/17/2020. EF 60-65%. No source of embolus -  no need to repleat  EEG no sz, generalized slowing  LDL 183   HgbA1c 5.5   VTE prophylaxis - Lovenox 40 mg sq daily   No antithrombotic prior to admission, now on aspirin 300 mg suppository daily. changed to DAPT, continue x 3 weeks then aspirin alone    Therapy recommendations:  pending   Disposition:  pending   Stroke team will sign off - follow up with neurology as previously scheduled with a neurologist ib [place]. Please keep that appt.    Carotid Stenosis  R ICA origin 50% in 2019  Hypertension  Stable . Permissive hypertension (OK if < 220/120) but gradually normalize in 5-7 days . Long-term BP goal normotensive  Hyperlipidemia  Home meds:  No statin  LDL 183 , goal < 70  Add lipitor 80  Continue statin at discharge  Dysphagia . Secondary to stroke . D1 honey thick liquids . Speech on board  Other Stroke Risk Factors  Advanced age  Hx stroke/TIA  Reported w/ no deficits. 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  Also w/ falls, orthostatic. Has incontinence past few months. 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 Has appt w/ neuro next week to consider shunt, do  LP. Dr. Pearlean BrownieSethi agrees w/ Dx  Hospital day # 1 Continue aspirin Plavix for 3 weeks followed by aspirin.  Aggressive risk factor modification.  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  Stroke team will sign off.  Kindly call for questions.  Discussed with Dr. Carmel Sacramentoostin Rishita Petron, MD  To contact Stroke Continuity provider, please refer to [email or web address]. After hours, contact General Neurology

## 2020-04-30 NOTE — Evaluation (Signed)
Speech Language Pathology Evaluation Patient Details Name: Kristin Rush MRN: [id number] DOB: 06/29/1941 Today's Date: 04/30/2020 Time: 5852-7782 SLP Time Calculation (min) (ACUTE ONLY): 19 min  Problem List:  Patient Active Problem List   Diagnosis Date Noted  . CVA (cerebral vascular accident) (HCC) 04/29/2020  . Acute CVA (cerebrovascular accident) (HCC) 04/28/2020  . 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 Frequent falls 03/17/2020  . Postural dizziness with presyncope 03/17/2020  . Essential hypertension 03/17/2020  . Right ear pain 03/17/2020  . Excessive cerumen in both ear canals 03/17/2020  . Syncope and collapse 03/17/2020  . 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 Lumbar spondylosis 02/18/2018  . Primary osteoarthritis of both hands 07/11/2017  . Orthostatic hypotension 10/23/2016  . Osteoporosis 04/16/2014  . Migraines 10/09/2011   Past Medical History:  Past Medical History:  Diagnosis Date  . 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 Lumbar spondylosis 02/18/2018  . Migraine   . Migraines 10/09/2011  . Orthostatic hypotension 10/23/2016  . Osteoporosis 04/16/2014  . Primary osteoarthritis of both hands 07/11/2017   Past Surgical History: History reviewed. No pertinent surgical history. HPI:  79 y.o. female with history of recently admission for recurrent syncope with orthostatic hypotension, CHF was brought to the ER after patient was found to have a left facial droop noticed around 3 PM.  Patient as per the patient's sister has not been doing well for the last couple of days with difficulty ambulating.  Has been found to have frequent episodes of low blood pressure. MRI of head revealed Acute infarct in the right corona radiata and deep white matter   Assessment / Plan / Recommendation Clinical Impression  Pt participated in speech/language/cognition evaluation with her husband present. Pt denied any  baseline deficits in speech, language, or cognition, but her husband indicated that she did have some difficulty with memory at baseline. He reported that her speech is now "softer". 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 He stated that at baseline the pt managed her own finances and lives alone; he indicated that he lives in a separate house next door and that her sister lives in the pt's basement apartment. [facility] Mental Status Examination was completed to evaluate the pt's cognitive-linguistic skills. She achieved a score of 4/30 which is below the normal limits of 27 or more out of 30. She consistently required additional processing time and exhibited difficulty in the areas of orientation, awareness, attention, memory, problem solving, and executive function. She also presented with mild-moderate dysarthria characterized by reduced articulatory precision which negatively impacted speech intelligibility at the word and phrase levels. Skilled SLP services are clinically indicated at this time to improve motor speech and cognitive-linguistic function.    SLP Assessment  SLP Recommendation/Assessment: Patient needs continued Speech Lanaguage Pathology Services SLP Visit Diagnosis: Cognitive communication deficit (R41.841);Dysarthria and anarthria (R47.1)    Follow Up Recommendations  Skilled Nursing facility    Frequency and Duration min 2x/week  2 weeks      SLP Evaluation Cognition  Overall Cognitive Status: Impaired/Different from baseline Arousal/Alertness: Awake/alert Orientation Level: Oriented to person;Disoriented to place;Disoriented to time;Disoriented to situation Attention: Focused;Sustained Focused Attention: Impaired Focused Attention Impairment: Verbal basic Sustained Attention: Impaired Sustained Attention Impairment: Verbal basic Memory: Impaired Memory Impairment: Retrieval deficit;Storage deficit;Decreased recall  of new information (Immediate: 2/5; delayed: 1/5 with cues: 0/4) Awareness: Impaired Awareness Impairment: Intellectual impairment Problem Solving: Impaired Problem Solving Impairment:  Verbal complex Executive Function: Reasoning;Sequencing;Organizing Reasoning: Impaired Reasoning Impairment: Verbal complex (Safety: 2/3) Sequencing: Impaired Sequencing Impairment: Verbal complex (Clock drawing: 0/4) Organizing: Impaired Organizing Impairment: Verbal complex (Backward digit span: 0/3)       Comprehension  Auditory Comprehension Overall Auditory Comprehension: Appears within functional limits for tasks assessed Yes/No Questions: Within Functional Limits Commands: Within Functional Limits (with additional support) Conversation: Simple Interfering Components: Attention;Processing speed;Working memory EffectiveTechniques: Extra processing time;Repetition;Slowed speech;Stressing words    Expression Expression Primary Mode of Expression: Verbal Verbal Expression Overall Verbal Expression: Appears within functional limits for tasks assessed Initiation: No impairment Level of Generative/Spontaneous Verbalization: Conversation Repetition: No impairment Pragmatics: Impairment Impairments: Eye contact Written Expression Dominant Hand: Right   Oral / Motor  Oral Motor/Sensory Function Overall Oral Motor/Sensory Function: Moderate impairment Facial ROM: Reduced left;Suspected CN VII (facial) dysfunction Facial Symmetry: Abnormal symmetry left;Suspected CN VII (facial) dysfunction Facial Strength: Reduced left;Suspected CN VII (facial) dysfunction Facial Sensation: Suspected CN V (Trigeminal) dysfunction;Reduced left Lingual ROM: Reduced left;Suspected CN XII (hypoglossal) dysfunction Lingual Strength: Suspected CN XII (hypoglossal) dysfunction;Reduced Motor Speech Overall Motor Speech: Impaired Respiration: Within functional limits Phonation: Hoarse;Low vocal intensity Resonance:  Within functional limits Articulation: Impaired Level of Impairment: Phrase Intelligibility: Intelligibility reduced Word: 50-74% accurate Phrase: 50-74% accurate Sentence: 25-49% accurate Conversation: 25-49% accurate Motor Planning: Witnin functional limits Motor Speech Errors: Aware;Consistent   Darrion Wyszynski I. Vear Clock, MS, CCC-SLP Acute Rehabilitation Services Office number [phone] Pager [phone]                    Scheryl Marten 04/30/2020, 11:58 AM       .

## 2020-04-30 NOTE — [place] FL2 (Signed)
[place] MEDICAID FL2 LEVEL OF CARE SCREENING TOOL     IDENTIFICATION  Patient Name: Kristin Rush Birthdate: 06-Sep-1941 Sex: female Admission Date (Current Location): 04/28/2020  [facility] and [place] Number:  Producer, television/film/video and Address:  [facility] [place], [address] [place], [place] [address]      Provider Number: [id number]  Attending Physician Name and Address:  Leatha Gilding, MD  Relative Name and Phone Number:       Current Level of Care: Hospital Recommended Level of Care: Skilled Nursing Facility Prior Approval Number:    Date Approved/Denied:   PASRR Number: [id number]  Discharge Plan: SNF    Current Diagnoses: Patient Active Problem List   Diagnosis Date Noted  . CVA (cerebral vascular accident) (HCC) 04/29/2020  . Acute CVA (cerebrovascular accident) (HCC) 04/28/2020  . 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 Frequent falls 03/17/2020  . Postural dizziness with presyncope 03/17/2020  . Essential hypertension 03/17/2020  . Right ear pain 03/17/2020  . Excessive cerumen in both ear canals 03/17/2020  . Syncope and collapse 03/17/2020  . 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 Lumbar spondylosis 02/18/2018  . Primary osteoarthritis of both hands 07/11/2017  . Orthostatic hypotension 10/23/2016  . Osteoporosis 04/16/2014  . Migraines 10/09/2011    Orientation RESPIRATION BLADDER Height & Weight     Self  Normal Incontinent Weight: 129 lb 10.1 oz (58.8 kg) Height:     BEHAVIORAL SYMPTOMS/MOOD NEUROLOGICAL BOWEL NUTRITION STATUS      Continent Diet (see discharge summary)  AMBULATORY STATUS COMMUNICATION OF NEEDS Skin   Extensive Assist Verbally Normal                       Personal Care Assistance Level of Assistance  Bathing, Feeding, Dressing Bathing Assistance: Maximum assistance Feeding assistance: Limited assistance Dressing Assistance: Maximum assistance     Functional Limitations  Info  Sight, Hearing, Speech Sight Info: Adequate Hearing Info: Adequate Speech Info: Adequate    SPECIAL CARE FACTORS FREQUENCY  PT (By licensed PT), OT (By licensed OT)     PT Frequency: 5x week OT Frequency: 5x week            Contractures Contractures Info: Not present    Additional Factors Info  Allergies, Code Status Code Status Info: DNR             Current Medications (04/30/2020):  This is the current hospital active medication list Current Facility-Administered Medications  Medication Dose Route Frequency Provider Last Rate Last Admin  .  stroke: mapping our early stages of recovery book   Does not apply Once Eduard Clos, MD      . acetaminophen (TYLENOL) tablet 650 mg  650 mg Oral Q4H PRN Eduard Clos, MD       Or  . acetaminophen (TYLENOL) 160 MG/5ML solution 650 mg  650 mg Per Tube Q4H PRN Eduard Clos, MD       Or  . acetaminophen (TYLENOL) suppository 650 mg  650 mg Rectal Q4H PRN Eduard Clos, MD      . aspirin EC tablet 81 mg  81 mg Oral Daily Annie Main L, NP   81 mg at 04/30/20 0929  . atorvastatin (LIPITOR) tablet 80 mg  80 mg Oral q1800 Annie Main L, NP   80 mg at 04/29/20 1747  . clopidogrel (PLAVIX) tablet 75 mg  75 mg Oral Daily Layne Benton, NP  75 mg at 04/30/20 0929  . enoxaparin (LOVENOX) injection 40 mg  40 mg Subcutaneous Daily Eduard Clos, MD   40 mg at 04/30/20 0109  . hydrALAZINE (APRESOLINE) injection 10 mg  10 mg Intravenous Q4H PRN Eduard Clos, MD      . midodrine (PROAMATINE) tablet 5 mg  5 mg Oral TID WC Leatha Gilding, MD   5 mg at 04/30/20 1211  . Resource ThickenUp Clear   Oral PRN Legrand Pitts [facility]         Discharge Medications: Please see discharge summary for a list of discharge medications.  Relevant Imaging Results:  Relevant Lab Results:   Additional Information SS# [id number]; vaccinated x2 w/ [employer], LCSW

## 2020-04-30 NOTE — Evaluation (Signed)
Physical Therapy Evaluation Patient Details Name: Kristin Rush MRN: [id number] DOB: March 10, 1941 Today's Date: 04/30/2020   History of Present Illness  Kristin Rush is a 79 y.o. female who was noted to develop facial droop sometime after 3 PM.  She has a history of stroke with no residual deficits as well as memory loss.recently admission for recurrent syncope with orthostatic hypotension, CHF.Since last discharge last month patient has been largely bedbound as per the patient's sister. 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    Clinical Impression  Pt in bed upon arrival of PT, agreeable to evaluation at this time. Prior to admission the pt's husband states she was mobilizing with use of a RW and minA of family to maintain her stability due to continued difficulties with orthostatic hypotension following recent d/c. The pt received assist from her sisters with bathing and dressing, but was able to follow commands and participate in the activities. The pt now presents with limitations in functional mobility, motor planning and coordination, strength, activity tolerance, and stability due to above dx, and will continue to benefit from skilled PT to address these deficits. The pt's mobility is further complicated by L-sided inattention and neglect with R-sided gaze which further impacts safe stability and mobility. The pt required modA for all bed mobility and transfers this morning and mod/maxA to complete OOB transfers and short steps to the [facility]. The pt was orthostatic during session this morning (see below), and the RN was notified. The pt will continue to benefit from skilled PT acutely as well as prior to return home to improve functional strength, activity tolerance, and to reduce risk of falls and caregiver burden following eventual d/c  home.      Follow Up Recommendations SNF;Supervision/Assistance - 24 hour    Equipment Recommendations   (defer to post acute)    Recommendations for Other Services       Precautions / Restrictions Precautions Precautions: Fall Precaution Comments: orthostatic, L neglect/inattention, R-sided gaze Restrictions Weight Bearing Restrictions: No      Mobility  Bed Mobility Overal bed mobility: Needs Assistance Bed Mobility: Supine to Sit     Supine to sit: Min assist (with Mod A to scoot to EOB) Sit to supine: Max assist   General bed mobility comments: The pt was able to pull to sit with min/modA to move BLE and modA to raise her trunk from EOB. She was unable to initiate return to supine despite increased processing time and cues, and required maxA to return to bed.  Transfers Overall transfer level: Needs assistance Equipment used: Rolling walker (2 wheeled) Transfers: Sit to/from [employer] Sit to Stand: Mod assist Stand pivot transfers: Mod assist       General transfer comment: posterior lean, shuffling steps, close to sitting prematurely in chair on her left  Ambulation/Gait Ambulation/Gait assistance: Mod assist Gait Distance (Feet): 3 Feet Assistive device: Rolling walker (2 wheeled) Gait Pattern/deviations: Step-to pattern;Shuffle;Narrow base of support   Gait velocity interpretation: <1.31 ft/sec, indicative of household ambulator General Gait Details: pt with continued strong lateral and posterior lean, able to take small steps to [facility] with assist and RW in addition to assist for RW management     Modified Rankin (Stroke Patients Only) Modified Rankin (Stroke Patients Only) Pre-Morbid Rankin Score: Moderately severe disability Modified Rankin: Severe disability     Balance Overall balance assessment:  Needs assistance Sitting-balance support: Bilateral upper extremity supported;Feet supported Sitting balance-Leahy Scale:  Poor Sitting balance - Comments: posterior lean, sometimes she could correct with cues other times she needed A to correct Postural control: Posterior lean Standing balance support: Bilateral upper extremity supported Standing balance-Leahy Scale: Poor Standing balance comment: BUE support on RW and addtional support of therapist                             Pertinent Vitals/Pain Pain Assessment: No/denies pain Faces Pain Scale: Hurts little more Pain Location: headache Pain Descriptors / Indicators: Grimacing Pain Intervention(s): Limited activity within patient's tolerance;Monitored during session    Home Living Family/patient expects to be discharged to:: Skilled nursing facility Living Arrangements: Spouse/significant other Available Help at Discharge: Family;Friend(s);Available 24 hours/day (husband and multiple sisters to assist pt) Type of Home: House       Home Layout: One level Home Equipment: Walker - 2 wheels;Walker - 4 wheels;Cane - single point;Grab bars - toilet;Tub bench;Grab bars - tub/shower;Hand held shower head      Prior Function Level of Independence: Needs assistance   Gait / Transfers Assistance Needed: Pt ambulating with RW and assist of at least 1 person for all mobility. Was receiving HHPT PTA, was instructed not to stand without assist  ADL's / Homemaking Assistance Needed: pt receives assist to dress (bra, socks shoes), sits on shower bench for showers with assist of sisters to wash (she can wash UB), uses BSC at bedside and toilet riser in bathroom with A        Hand Dominance   Dominant Hand: Right    Extremity/Trunk Assessment   Upper Extremity Assessment Upper Extremity Assessment: LUE deficits/detail LUE Deficits / Details: lag compared to shoulder flexion on right, decreased coordination LUE Coordination: decreased fine motor;decreased gross motor    Lower Extremity Assessment Lower Extremity Assessment: Generalized  weakness (pt denies difference in sensation bilaterally, she was able to grossly extend her leg against moderate resistance in supine, but unable to maintain MMT position for testing)    Cervical / Trunk Assessment Cervical / Trunk Assessment: Kyphotic  Communication   Communication:  (slow to respond at times)  Cognition Arousal/Alertness: Awake/alert Behavior During Therapy: Flat affect Overall Cognitive Status: Impaired/Different from baseline Area of Impairment: Following commands;Problem solving;Memory;Safety/judgement;Orientation                 Orientation Level: Disoriented to;Place;Time;Situation (didn't know where she was even with choices, got month but not year (2001 then self corrected to  2010)) Current Attention Level: Focused Memory: Decreased short-term memory Following Commands: Follows one step commands inconsistently Safety/Judgement: Decreased awareness of safety;Decreased awareness of deficits   Problem Solving: Decreased initiation;Difficulty sequencing;Requires verbal cues General Comments: Pt more awake than when PT saw her earlier, definite left inattention possibly field cut      General Comments General comments (skin integrity, edema, etc.): Pt orthostatic again today and her husband was present to confirm this is what was happening at home still. However, he reports the pt's strength and command following is significantly impaired compared to prior to this admission    Exercises Low Level/ICU Exercises Hip Extension: Seated   Assessment/Plan    PT Assessment Patient needs continued PT services  PT Problem List Decreased strength;Decreased mobility;Decreased safety awareness;Decreased range of motion;Decreased activity tolerance;Decreased balance;Decreased knowledge of use of DME;Decreased cognition       PT Treatment Interventions DME instruction;Therapeutic exercise;Gait training;Stair training;Cognitive remediation;Functional  mobility  training;Therapeutic activities;Patient/family education;Neuromuscular re-education;Balance training    PT Goals (Current goals can be found in the Care Plan section)  Acute Rehab PT Goals Patient Stated Goal: none stated PT Goal Formulation: Patient unable to participate in goal setting Time For Goal Achievement: 05/14/20 Potential to Achieve Goals: Fair    Frequency Min 3X/week   Barriers to discharge Decreased caregiver support pt is currently being cared for by her husband and sisters who are unable to provide mod/maxA safely       AM-PAC PT "6 Clicks" Mobility  Outcome Measure Help needed turning from your back to your side while in a flat bed without using bedrails?: A Little Help needed moving from lying on your back to sitting on the side of a flat bed without using bedrails?: A Lot Help needed moving to and from a bed to a chair (including a wheelchair)?: A Lot Help needed standing up from a chair using your arms (e.g., wheelchair or bedside chair)?: A Lot Help needed to walk in hospital room?: Total Help needed climbing 3-5 steps with a railing? : Total 6 Click Score: 11    End of Session Equipment Utilized During Treatment: Gait belt Activity Tolerance: Patient tolerated treatment well;Other (comment) (orthostatic) Patient left: in bed;with call bell/phone within reach;with bed alarm set;with family/visitor present (in chair position) Nurse Communication: Mobility status PT Visit Diagnosis: Repeated falls (R29.6);Muscle weakness (generalized) (M62.81);Difficulty in walking, not elsewhere classified (R26.2);Hemiplegia and hemiparesis Hemiplegia - Right/Left: Left Hemiplegia - dominant/non-dominant: Non-dominant Hemiplegia - caused by: Cerebral infarction    Time: 8115-7262 PT Time Calculation (min) (ACUTE ONLY): 34 min   Charges:   PT Evaluation $PT Eval Moderate Complexity: 1 Mod PT Treatments $Gait Training: 8-22 mins        Rolm Baptise, PT, DPT   Acute  Rehabilitation Department Pager #: [phone]  Gaetana Michaelis 04/30/2020, 11:33 AM

## 2020-04-30 NOTE — Progress Notes (Signed)
  Speech Language Pathology Treatment: Dysphagia  Patient Details Name: Kristin Rush MRN: [id number] DOB: 20-May-1941 Today's Date: 04/30/2020 Time: 4401-0272 SLP Time Calculation (min) (ACUTE ONLY): 11 min  Assessment / Plan / Recommendation Clinical Impression  Pt was seen for dysphagia treatment. She was alert throughout the session but required cues to attend to tasks. She tolerated puree solids and honey thick liquids via cup without overt s/sx of aspiration. Mild oral residue was noted with puree solids. Prolonged coughing was exhibited following trials of ice chips, suggesting aspiration. Pt's husband reported that this was also observed frequently at home with thin liquids. It is recommended that the pt's current diet be continued and SLP will continue to follow pt.    HPI HPI: 79 y.o. female with history of recently admission for recurrent syncope with orthostatic hypotension, CHF was brought to the ER after patient was found to have a left facial droop noticed around 3 PM.  Patient as per the patient's sister has not been doing well for the last couple of days with difficulty ambulating.  Has been found to have frequent episodes of low blood pressure. MRI of head revealed Acute infarct in the right corona radiata and deep white matter      SLP Plan  Continue with current plan of care  Patient needs continued Speech Lanaguage Pathology Services    Recommendations  Diet recommendations: Dysphagia 1 (puree);Honey-thick liquid Liquids provided via: Cup;Straw Medication Administration: Crushed with puree Supervision: Staff to assist with self feeding Compensations: Slow rate;Small sips/bites;Effortful swallow;Multiple dry swallows after each bite/sip;Lingual sweep for clearance of pocketing;Clear throat after each swallow Postural Changes and/or Swallow Maneuvers: Seated upright 90 degrees                Oral Care Recommendations: Oral care BID Follow up Recommendations:  Skilled Nursing facility SLP Visit Diagnosis: Cognitive communication deficit (R41.841);Dysarthria and anarthria (R47.1) Plan: Continue with current plan of care       Taurus Willis I. Vear Clock, MS, CCC-SLP Acute Rehabilitation Services Office number [phone] Pager [phone]                Scheryl Marten 04/30/2020, 1:15 PM

## 2020-04-30 NOTE — Progress Notes (Signed)
PROGRESS NOTE  Kristin Rush MCN:[id number] DOB: 1941/08/15 DOA: 04/28/2020 PCP: Eartha Inch, MD   LOS: 1 day   Brief Narrative / Interim history: 79 year old female with recent admission for recurrent syncope with orthostatic hypotension, very mild cognitive difficulties, chronic diastolic CHF, who lives with her sister who is being brought to the hospital after sister mentioned that she has had left facial droop yesterday afternoon.  Patient sister also tells me that she has had difficulties moving her legs and reports that almost on a daily basis the patient has some spells in which she stares in space and is unable to move for short period of time.  Since discharge last month patient has been largely bedbound.  In the ED an MRI of the brain confirmed acute stroke involving the right corona radiata, also showed hydrocephalus.  Neurology consulted  Subjective / 24h Interval events: Not very communicative this morning but shakes her head when asked about chest pain, shortness of breath or any other discomfort  Assessment & Plan: Principal Problem Acute CVA-MRI confirms acute stroke involving the right corona radiata.  Neurology consulted and following, currently undergoing stroke work-up.  A 2D echo was recently done on 03/17/2020 during her admission and showed normal EF 60-65% and grade 2 diastolic dysfunction.  CT angiogram of the head and neck without significant carotid or vertebral artery stenosis.  Due to spells reported by sister will obtain an EEG -Lipid panel shows an LDL of 183.  She was started on atorvastatin. -A1c 5.5 -Speech evaluated patient under concern for new oropharyngeal dysphagia with acute CVA.  She is on dysphagia 1 with honey thick liquids.  Husband tells me that she has been having coughing with liquids over the last several weeks prior to the stroke -PT/OT recommendations pending still  Active Problems History of orthostatic hypotension-she was quite  orthostatic this morning when trying to work with physical therapy with systolic going into the 70s.  Add back the midodrine and bolus 500 cc.  Chronic diastolic CHF-appears compensated, closely monitor, stop standing fluids  Hydrocephalus seen on MRI-apparently this is to be discussed as an outpatient for shunt consideration  Scheduled Meds: .  stroke: mapping our early stages of recovery book   Does not apply Once  . aspirin EC  81 mg Oral Daily  . atorvastatin  80 mg Oral q1800  . clopidogrel  75 mg Oral Daily  . enoxaparin (LOVENOX) injection  40 mg Subcutaneous Daily  . midodrine  5 mg Oral TID WC   Continuous Infusions: . sodium chloride 50 mL/hr at 04/29/20 1922   PRN Meds:.acetaminophen **OR** acetaminophen (TYLENOL) oral liquid 160 mg/5 mL **OR** acetaminophen, hydrALAZINE, Resource ThickenUp Clear  Diet Orders (From admission, onward)    Start     Ordered   04/29/20 1050  DIET - DYS 1 Room service appropriate? Yes; Fluid consistency: Honey Thick  Diet effective now       Comments: Full supervision with POs, meds crushed  Question Answer Comment  Room service appropriate? Yes   Fluid consistency: Honey Thick      04/29/20 1049          DVT prophylaxis: enoxaparin (LOVENOX) injection 40 mg Start: 04/29/20 1000     Code Status: DNR  Family Communication: Husband at bedside  Status is: Observation  The patient will require care spanning > 2 midnights and should be moved to inpatient because: Inpatient level of care appropriate due to severity of illness  Dispo: The patient  is from: Home              Anticipated d/c is to: TBD              Anticipated d/c date is: 2 days              Patient currently is not medically stable to d/c.  Consultants:  Neurology   Procedures:  2D echo  Microbiology  None   Antimicrobials: None     Objective: Vitals:   04/29/20 1634 04/29/20 2015 04/30/20 0018 04/30/20 0430  BP: (!) 163/78 (!) 175/97 (!) 158/81 (!)  159/109  Pulse: 92 98 86 87  Resp: 17 18 17 17   Temp: 98.6 F (37 C) 98.8 F (37.1 C) 98.7 F (37.1 C) 97.9 F (36.6 C)  TempSrc: Oral Oral Axillary Axillary  SpO2: 97% 98% 97% 100%  Weight:   58.8 kg     Intake/Output Summary (Last 24 hours) at 04/30/2020 1101 Last data filed at 04/30/2020 0330 Gross per 24 hour  Intake 404.87 ml  Output --  Net 404.87 ml   Filed Weights   04/28/20 1600 04/30/20 0018  Weight: 59.1 kg 58.8 kg    Examination:  Constitutional: No distress Eyes: No icterus seen ENMT:mmm Respiratory: Clear bilaterally, moves air well, no wheezing or crackles heard Cardiovascular: Regular rate and rhythm, no murmurs, no peripheral edema Abdomen: Soft, nontender, nondistended, bowel sounds positive Musculoskeletal: no clubbing / cyanosis.  Skin: No rashes appreciated Neurologic: Facial droop present, strength overall equal, no new deficits   Data Reviewed: I have independently reviewed following labs and imaging studies   CBC: Recent Labs  Lab 04/28/20 1649 04/28/20 1651  WBC  --  4.3  NEUTROABS  --  2.4  HGB 12.6 12.3  HCT 37.0 37.5  MCV  --  94.2  PLT  --  158   Basic Metabolic Panel: Recent Labs  Lab 04/28/20 1649 04/28/20 1651  NA 137 136  K 4.3 4.4  CL 97* 100  CO2  --  28  GLUCOSE 87 91  BUN 13 12  CREATININE 1.00 0.92  CALCIUM  --  9.3   Liver Function Tests: Recent Labs  Lab 04/28/20 1651  AST 27  ALT 23  ALKPHOS 88  BILITOT 0.7  PROT 6.2*  ALBUMIN 3.6   Coagulation Profile: Recent Labs  Lab 04/28/20 1651  INR 0.9   HbA1C: Recent Labs    04/29/20 0817  HGBA1C 5.5   CBG: Recent Labs  Lab 04/28/20 1641  GLUCAP 83    Recent Results (from the past 240 hour(s))  SARS Coronavirus 2 by RT PCR (hospital order, performed in [facility] hospital lab) Nasopharyngeal Nasopharyngeal Swab     Status: None   Collection Time: 04/28/20  8:12 PM   Specimen: Nasopharyngeal Swab  Result Value Ref Range Status   SARS  Coronavirus 2 NEGATIVE NEGATIVE Final    Comment: (NOTE) SARS-CoV-2 target nucleic acids are NOT DETECTED.  The SARS-CoV-2 RNA is generally detectable in upper and lower respiratory specimens during the acute phase of infection. The lowest concentration of SARS-CoV-2 viral copies this assay can detect is 250 copies / mL. A negative result does not preclude SARS-CoV-2 infection and should not be used as the sole basis for treatment or other patient management decisions.  A negative result may occur with improper specimen collection / handling, submission of specimen other than nasopharyngeal swab, presence of viral mutation(s) within the areas targeted by this assay, and  inadequate number of viral copies (<250 copies / mL). A negative result must be combined with clinical observations, patient history, and epidemiological information.  Fact Sheet for Patients:   BoilerBrush.com.cy  Fact Sheet for Healthcare Providers: https://pope.com/  This test is not yet approved or  cleared by the [place] FDA and has been authorized for detection and/or diagnosis of SARS-CoV-2 by FDA under an Emergency Use Authorization (EUA).  This EUA will remain in effect (meaning this test can be used) for the duration of the COVID-19 declaration under Section 564(b)(1) of the Act, 21 U.S.C. section 360bbb-3(b)(1), unless the authorization is terminated or revoked sooner.  Performed at [facility], [address], [place], [place] [address]      Radiology Studies: EEG  Result Date: 04/29/2020 Charlsie Quest, MD     04/29/2020  2:10 PM Patient Name: Mkenzie Dotts MRN: [id number] Epilepsy Attending: Charlsie Quest Referring Physician/Provider: Dr. Syliva Overman Date: 04/29/2020 Duration: 24.27 minutes Patient history: 79 year old female with episodes of staring off and inability to move briefly.  EEG to evaluate for seizures. Level of  alertness: Awake AEDs during EEG study: None Technical aspects: This EEG study was done with scalp electrodes positioned according to the 10-20 International system of electrode placement. Electrical activity was acquired at a sampling rate of 500Hz  and reviewed with a high frequency filter of 70Hz  and a low frequency filter of 1Hz . EEG data were recorded continuously and digitally stored. Description: No posterior dominant rhythm was seen.  EEG showed continuous generalized polymorphic 5 to 6 Hz theta slowing as well as intermittent generalized 2 to 3 Hz delta slowing.  Hyperventilation and photic stimulation were not performed.   ABNORMALITY - Continuous slow, generalized IMPRESSION: This study is suggestive of moderate diffuse encephalopathy, nonspecific to etiology.  No seizures or epileptiform discharges were seen throughout the recording. Priyanka , MD, PhD [facility]  Between 7 am - 7 pm I am available, please contact me via Amion or Securechat  Between 7 pm - 7 am I am not available, please contact night coverage MD/APP via Amion

## 2020-05-01 LAB — CBC
HCT: 35.2 % — ABNORMAL LOW (ref 36.0–46.0)
Hemoglobin: 11.5 g/dL — ABNORMAL LOW (ref 12.0–15.0)
MCH: 30.6 pg (ref 26.0–34.0)
MCHC: 32.7 g/dL (ref 30.0–36.0)
MCV: 93.6 fL (ref 80.0–100.0)
Platelets: 135 10*3/uL — ABNORMAL LOW (ref 150–400)
RBC: 3.76 MIL/uL — ABNORMAL LOW (ref 3.87–5.11)
RDW: 13.6 % (ref 11.5–15.5)
WBC: 4.1 10*3/uL (ref 4.0–10.5)
nRBC: 0 % (ref 0.0–0.2)

## 2020-05-01 LAB — BASIC METABOLIC PANEL
Anion gap: 8 (ref 5–15)
BUN: 13 mg/dL (ref 8–23)
CO2: 26 mmol/L (ref 22–32)
Calcium: 8.8 mg/dL — ABNORMAL LOW (ref 8.9–10.3)
Chloride: 106 mmol/L (ref 98–111)
Creatinine, Ser: 0.91 mg/dL (ref 0.44–1.00)
GFR calc Af Amer: >60 mL/min (ref >60)
GFR calc non Af Amer: >60 mL/min (ref >60)
Glucose, Bld: 93 mg/dL (ref 70–99)
Potassium: 4.4 mmol/L (ref 3.5–5.1)
Sodium: 140 mmol/L (ref 135–145)

## 2020-05-01 NOTE — Progress Notes (Signed)
Inpatient Rehab Admissions Coordinator:   Met with patient at bedside to discuss potential CIR admission. Pt. And family stated interest. Will reach out to PT/OT and see if they feel Pt. Could tolerate CIR level therapies. If so, will pursue for potential admit next week.   Clemens Catholic, [place], [place] Admissions Coordinator  [phone] ([place]) [phone] (office)

## 2020-05-01 NOTE — TOC Progression Note (Addendum)
Transition of Care [facility]) - Progression Note    Patient Details  Name: Kristin Rush MRN: [id number] Date of Birth: 1941/02/20  [facility] of [facility]) CM/SW Contact  Annalee Genta, LCSW Phone Number: 05/01/2020, 2:02 PM  Clinical Narrative:  CSW reached out to sister and noted at this time they do not want a SNF placement. Family reported preference to Home health with [facility]. CSW left voicemail with [facility] for follow-up if service is available. Family reports they wanted to make sure patient saw a neurologist and CSW confirmed with family inpatient neurologist is recommended outpatient neurology follow-up. CSW discussed with RNCM and attending and notes referral will be placed for outpatient neurology in [place] per request and notes family does not want to continue with neurologist in [place] at this time. CSW will follow-up with family when home health reaches back out.   4:25PM: CIR is going to follow-up with PT/OT for follow-up re-evaluation for appropriate discharge supports. CSW will continue to follow for any change of discharge dispositon. CSW will continue to follow and will update [facility] of disposition delay if they reach out.          Expected Discharge Plan and Services                                                 Social Determinants of Health (SDOH) Interventions    Readmission Risk Interventions No flowsheet data found.

## 2020-05-01 NOTE — Progress Notes (Signed)
PROGRESS NOTE    Kristin Rush  ZOX:[id number] DOB: 07/22/1941 DOA: 04/28/2020 PCP: Eartha Inch, MD    Brief Narrative:  Kristin Rush is a 79 year old female with recent admission for recurrent syncope with orthostatic hypotension, very mild cognitive difficulties, chronic diastolic CHF, who lives with her sister who is being brought to the hospital after sister mentioned that she has had left facial droop yesterday afternoon.  Patient sister also tells me that she has had difficulties moving her legs and reports that almost on a daily basis the patient has some spells in which she stares in space and is unable to move for short period of time.  Since discharge last month patient has been largely bedbound.  In the ED an MRI of the brain confirmed acute stroke involving the right corona radiata, also showed hydrocephalus.  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  MR brain notable for acute stroke right corona radiata.  Neurology was consulted and followed during the hospital course.  CT angiogram head/neck without significant carotid or vertebral artery stenosis.  EEG with no epileptiform/seizure-like activity.  Lipid panel with LDL 183, total cholesterol 261, HDL 66, triglycerides 62.  Hemoglobin A1c 5.5. --PT/OT recommend SNF, patient/sister refusing and requests CIR evaluation; consultation placed --Plavix 75 mg p.o. daily for 3 weeks --Aspirin 81 mg p.o. daily --Atorvastatin 80 mg p.o. daily --Dysphagia 1 pured, honey thickened liquid diet; meds crushed with pure --Continue monitor on telemetry  History of orthostatic  hypotension --Continue home midodrine 5 mg p.o. 3 times daily  Chronic diastolic congestive heart failure, compensated TTE 03/17/2020 with LVEF 60-65%, grade 2 diastolic dysfunction, trivial MR, no aortic stenosis. --Not on beta-blocker or ACE inhibitor secondary to significant orthostasis requiring midodrine as above  Anxiety: Alprazolam 0.25 mg p.o. twice daily as needed  Hydrocephalus, chronic Patient has followed with [facility] neurology in [place] in the past, apparently was instructed that she needs a shunt 2 years ago but patient declines.  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  Patient and sister state would like new referral to neurology in [place]. --Outpatient neurology referral placed   DVT prophylaxis: Lovenox Code Status: DNR Family Communication: Sister present at bedside  Disposition Plan:  Status is: Inpatient  Remains inpatient appropriate because:Unsafe d/c plan   Dispo: The patient is from: Home              Anticipated d/c is to: CIR              Anticipated d/c date is: 2 days              Patient currently is medically stable to d/c.   Consultants:   Neurology - signed off 04/30/2020  Procedures:   TTE  Antimicrobials:   None   Subjective: Patient seen and examined bedside, resting comfortably.  Sister present.  No specific complaints this morning.  Patient and sister declined SNF placement, stay either want CIR versus home health.,  No chest pain, no palpitations, no shortness of breath, no abdominal pain.  No acute events overnight per nursing staff.  Objective: Vitals:   05/01/20 0034 05/01/20 0531  05/01/20 0807 05/01/20 1210  BP: (!) 155/85 (!) 142/68 (!) 160/68 (!) 116/54  Pulse: 96 72 76 79  Resp: 17 16 16 16   Temp: 98.3 F (36.8 C)  (!) 97.5 F (36.4 C) 98 F (36.7 C)  TempSrc: Oral  Oral Oral  SpO2: 96% 97% 97% 98%  Weight:        Intake/Output Summary (Last 24  hours) at 05/01/2020 1347 Last data filed at 04/30/2020 1500 Gross per 24 hour  Intake 720.83 ml  Output --  Net 720.83 ml   Filed Weights   04/28/20 1600 04/30/20 0018  Weight: 59.1 kg 58.8 kg    Examination:  General exam: Appears calm and comfortable  Respiratory system: Clear to auscultation. Respiratory effort normal. Cardiovascular system: S1 & S2 heard, RRR. No JVD, murmurs, rubs, gallops or clicks. No pedal edema. Gastrointestinal system: Abdomen is nondistended, soft and nontender. No organomegaly or masses felt. Normal bowel sounds heard. Central nervous system: Alert and oriented.  Facial droop noted Extremities: Symmetric 5 x 5 power. Skin: No rashes, lesions or ulcers Psychiatry: Judgement and insight appear normal. Mood & affect appropriate.     Data Reviewed: I have personally reviewed following labs and imaging studies  CBC: Recent Labs  Lab 04/28/20 1649 04/28/20 1651 05/01/20 0328  WBC  --  4.3 4.1  NEUTROABS  --  2.4  --   HGB 12.6 12.3 11.5*  HCT 37.0 37.5 35.2*  MCV  --  94.2 93.6  PLT  --  158 135*   Basic Metabolic Panel: Recent Labs  Lab 04/28/20 1649 04/28/20 1651 05/01/20 0328  NA 137 136 140  K 4.3 4.4 4.4  CL 97* 100 106  CO2  --  28 26  GLUCOSE 87 91 93  BUN 13 12 13   CREATININE 1.00 0.92 0.91  CALCIUM  --  9.3 8.8*   GFR: Estimated Creatinine Clearance: 42.8 mL/min (by C-G formula based on SCr of 0.91 mg/dL). Liver Function Tests: Recent Labs  Lab 04/28/20 1651  AST 27  ALT 23  ALKPHOS 88  BILITOT 0.7  PROT 6.2*  ALBUMIN 3.6   No results for input(s): LIPASE, AMYLASE in the last 168 hours. No results for input(s): AMMONIA in the last 168 hours. Coagulation Profile: Recent Labs  Lab 04/28/20 1651  INR 0.9   Cardiac Enzymes: No results for input(s): CKTOTAL, CKMB, CKMBINDEX, TROPONINI in the last 168 hours. BNP (last 3 results) No results for input(s): PROBNP in the last 8760 hours. HbA1C: Recent Labs     04/29/20 0817  HGBA1C 5.5   CBG: Recent Labs  Lab 04/28/20 1641  GLUCAP 83   Lipid Profile: Recent Labs    04/29/20 0817  CHOL 261*  HDL 66  LDLCALC 183*  TRIG 62  CHOLHDL 4.0   Thyroid Function Tests: No results for input(s): TSH, T4TOTAL, FREET4, T3FREE, THYROIDAB in the last 72 hours. Anemia Panel: No results for input(s): VITAMINB12, FOLATE, FERRITIN, TIBC, IRON, RETICCTPCT in the last 72 hours. Sepsis Labs: No results for input(s): PROCALCITON, LATICACIDVEN in the last 168 hours.  Recent Results (from the past 240 hour(s))  SARS Coronavirus 2 by RT PCR (hospital order, performed in [facility] hospital lab) Nasopharyngeal Nasopharyngeal Swab     Status: None   Collection Time: 04/28/20  8:12 PM   Specimen: Nasopharyngeal Swab  Result Value Ref Range Status   SARS Coronavirus 2 NEGATIVE NEGATIVE Final    Comment: (NOTE) SARS-CoV-2 target nucleic acids are NOT DETECTED.  The SARS-CoV-2 RNA is generally detectable in upper and lower respiratory specimens during the acute phase of infection. The lowest concentration of SARS-CoV-2 viral copies this assay can detect is 250 copies / mL. A negative result does not preclude SARS-CoV-2 infection and should not be used as the sole basis for treatment or other patient management decisions.  A negative result may occur with improper specimen collection / handling, submission of specimen other than nasopharyngeal swab, presence of viral mutation(s) within the areas targeted by this assay, and inadequate number of viral copies (<250 copies / mL). A negative result must be combined with clinical observations, patient history, and epidemiological information.  Fact Sheet for Patients:   BoilerBrush.com.cy  Fact Sheet for Healthcare Providers: https://pope.com/  This test is not yet approved or  cleared by the [place] FDA and has been authorized for detection and/or  diagnosis of SARS-CoV-2 by FDA under an Emergency Use Authorization (EUA).  This EUA will remain in effect (meaning this test can be used) for the duration of the COVID-19 declaration under Section 564(b)(1) of the Act, 21 U.S.C. section 360bbb-3(b)(1), unless the authorization is terminated or revoked sooner.  Performed at [facility], [address] [place], [place] [address]          Radiology Studies: EEG  Result Date: 04/29/2020 Kristin Quest, MD     04/29/2020  2:10 PM Patient Name: Kristin Rush MRN: [id number] Epilepsy Attending: Charlsie Rush Referring Physician/Provider: Dr. Syliva Overman Date: 04/29/2020 Duration: 24.27 minutes Patient history: 79 year old female with episodes of staring off and inability to move briefly.  EEG to evaluate for seizures. Level of alertness: Awake AEDs during EEG study: None Technical aspects: This EEG study was done with scalp electrodes positioned according to the 10-20 International system of electrode placement. Electrical activity was acquired at a sampling rate of 500Hz  and reviewed with a high frequency filter of 70Hz  and a low frequency filter of 1Hz . EEG data were recorded continuously and digitally stored. Description: No posterior dominant rhythm was seen.  EEG showed continuous generalized polymorphic 5 to 6 Hz theta slowing as well as intermittent generalized 2 to 3 Hz delta slowing.  Hyperventilation and photic stimulation were not performed.   ABNORMALITY - Continuous slow, generalized IMPRESSION: This study is suggestive of moderate diffuse encephalopathy, nonspecific to etiology.  No seizures or epileptiform discharges were seen throughout the recording. Priyanka        Scheduled Meds:   stroke: mapping our early stages of recovery book   Does not apply Once   aspirin EC  81 mg Oral Daily   atorvastatin  80 mg Oral q1800   clopidogrel  75 mg Oral Daily   enoxaparin (LOVENOX) injection  40 mg  Subcutaneous Daily   midodrine  5 mg Oral TID WC   Continuous Infusions:   LOS: 2 days    Time spent: 38 minutes spent on chart review, discussion with nursing staff, consultants, updating family and interview/physical exam; more than 50% of that time was spent in counseling and/or coordination of care.     , DO [facility] Available via Epic secure chat 7am-7pm After these hours, please refer to coverage provider listed on [email or web address] 05/01/2020, 1:47 PM

## 2020-05-02 NOTE — Progress Notes (Signed)
PROGRESS NOTE    Kristin Rush  MEQ:[id number] DOB: March 31, 1941 DOA: 04/28/2020 PCP: Eartha Inch, MD    Brief Narrative:  Kristin Rush is a 79 year old female with recent admission for recurrent syncope with orthostatic hypotension, very mild cognitive difficulties, chronic diastolic CHF, who lives with her sister who is being brought to the hospital after sister mentioned that she has had left facial droop yesterday afternoon.  Patient sister also tells me that she has had difficulties moving her legs and reports that almost on a daily basis the patient has some spells in which she stares in space and is unable to move for short period of time.  Since discharge last month patient has been largely bedbound.  In the ED an MRI of the brain confirmed acute stroke involving the right corona radiata, also showed hydrocephalus.  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  MR brain notable for acute stroke right corona radiata.  Neurology was consulted and followed during the hospital course.  CT angiogram head/neck without significant carotid or vertebral artery stenosis.  EEG with no epileptiform/seizure-like activity.  Lipid panel with LDL 183, total cholesterol 261, HDL 66, triglycerides 62.  Hemoglobin A1c 5.5. --PT/OT recommend SNF, patient/sister refusing and requests CIR evaluation; consultation placed --Plavix 75 mg p.o. daily for 3 weeks --Aspirin 81 mg p.o. daily --Atorvastatin 80 mg p.o. daily --Dysphagia 1 pured, honey thickened liquid diet; meds crushed with pure --Continue monitor on telemetry  History of orthostatic  hypotension --Continue home midodrine 5 mg p.o. 3 times daily  Chronic diastolic congestive heart failure, compensated TTE 03/17/2020 with LVEF 60-65%, grade 2 diastolic dysfunction, trivial MR, no aortic stenosis. --Not on beta-blocker or ACE inhibitor secondary to significant orthostasis requiring midodrine as above  Anxiety: Alprazolam 0.25 mg p.o. twice daily as needed  Hydrocephalus, chronic Patient has followed with [facility] neurology in [place] in the past, apparently was instructed that she needs a shunt 2 years ago but patient declines.  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  Patient and sister state would like new referral to neurology in [place]. --Outpatient neurology referral placed   DVT prophylaxis: Lovenox Code Status: DNR Family Communication: Sister present at bedside  Disposition Plan:  Status is: Inpatient  Remains inpatient appropriate because:Unsafe d/c plan   Dispo: The patient is from: Home              Anticipated d/c is to: CIR              Anticipated d/c date is: 2 days              Patient currently is medically stable to d/c.   Consultants:   Neurology - signed off 04/30/2020  Procedures:   TTE  Antimicrobials:   None   Subjective: Patient seen and examined bedside, resting comfortably.  Sister present.  No specific complaints this morning. Awaiting updated PT/OT notes for possible CIR admission. No chest pain, no palpitations, no shortness of breath, no abdominal pain.  No acute events overnight per nursing staff.  Objective: Vitals:   05/01/20 2015 05/02/20 0011 05/02/20 0436 05/02/20 0826  BP: Marland Kitchen)  143/64 (!) 149/71 (!) 145/72 (!) 122/46  Pulse: 80 73 74 75  Resp: 18 16 16 18   Temp: 98.7 F (37.1 C) 98.8 F (37.1 C) 98.4 F (36.9 C)   TempSrc: Oral Oral Oral Oral  SpO2: 98% 97% 98% 98%  Weight:       No intake or output data in the 24 hours ending 05/02/20  1343 Filed Weights   04/28/20 1600 04/30/20 0018  Weight: 59.1 kg 58.8 kg    Examination:  General exam: Appears calm and comfortable  Respiratory system: Clear to auscultation. Respiratory effort normal. Cardiovascular system: S1 & S2 heard, RRR. No JVD, murmurs, rubs, gallops or clicks. No pedal edema. Gastrointestinal system: Abdomen is nondistended, soft and nontender. No organomegaly or masses felt. Normal bowel sounds heard. Central nervous system: Alert and oriented.  Facial droop noted Extremities: Symmetric 5 x 5 power. Skin: No rashes, lesions or ulcers Psychiatry: Judgement and insight appear normal. Mood & affect appropriate.     Data Reviewed: I have personally reviewed following labs and imaging studies  CBC: Recent Labs  Lab 04/28/20 1649 04/28/20 1651 05/01/20 0328  WBC  --  4.3 4.1  NEUTROABS  --  2.4  --   HGB 12.6 12.3 11.5*  HCT 37.0 37.5 35.2*  MCV  --  94.2 93.6  PLT  --  158 135*   Basic Metabolic Panel: Recent Labs  Lab 04/28/20 1649 04/28/20 1651 05/01/20 0328  NA 137 136 140  K 4.3 4.4 4.4  CL 97* 100 106  CO2  --  28 26  GLUCOSE 87 91 93  BUN 13 12 13   CREATININE 1.00 0.92 0.91  CALCIUM  --  9.3 8.8*   GFR: Estimated Creatinine Clearance: 42.8 mL/min (by C-G formula based on SCr of 0.91 mg/dL). Liver Function Tests: Recent Labs  Lab 04/28/20 1651  AST 27  ALT 23  ALKPHOS 88  BILITOT 0.7  PROT 6.2*  ALBUMIN 3.6   No results for input(s): LIPASE, AMYLASE in the last 168 hours. No results for input(s): AMMONIA in the last 168 hours. Coagulation Profile: Recent Labs  Lab 04/28/20 1651  INR 0.9   Cardiac Enzymes: No results for input(s): CKTOTAL, CKMB, CKMBINDEX, TROPONINI in the last 168 hours. BNP (last 3 results) No results for input(s): PROBNP in the last 8760 hours. HbA1C: No results for input(s): HGBA1C in the last 72 hours. CBG: Recent Labs  Lab 04/28/20 1641  GLUCAP 83   Lipid Profile: No results for  input(s): CHOL, HDL, LDLCALC, TRIG, CHOLHDL, LDLDIRECT in the last 72 hours. Thyroid Function Tests: No results for input(s): TSH, T4TOTAL, FREET4, T3FREE, THYROIDAB in the last 72 hours. Anemia Panel: No results for input(s): VITAMINB12, FOLATE, FERRITIN, TIBC, IRON, RETICCTPCT in the last 72 hours. Sepsis Labs: No results for input(s): PROCALCITON, LATICACIDVEN in the last 168 hours.  Recent Results (from the past 240 hour(s))  SARS Coronavirus 2 by RT PCR (hospital order, performed in [facility] hospital lab) Nasopharyngeal Nasopharyngeal Swab     Status: None   Collection Time: 04/28/20  8:12 PM   Specimen: Nasopharyngeal Swab  Result Value Ref Range Status   SARS Coronavirus 2 NEGATIVE NEGATIVE Final    Comment: (NOTE) SARS-CoV-2 target nucleic acids are NOT DETECTED.  The SARS-CoV-2 RNA is generally detectable in upper and lower respiratory specimens during the acute phase of infection. The lowest concentration of SARS-CoV-2 viral copies this assay can detect is 250 copies / mL. A negative result does  not preclude SARS-CoV-2 infection and should not be used as the sole basis for treatment or other patient management decisions.  A negative result may occur with improper specimen collection / handling, submission of specimen other than nasopharyngeal swab, presence of viral mutation(s) within the areas targeted by this assay, and inadequate number of viral copies (<250 copies / mL). A negative result must be combined with clinical observations, patient history, and epidemiological information.  Fact Sheet for Patients:   BoilerBrush.com.cy  Fact Sheet for Healthcare Providers: https://pope.com/  This test is not yet approved or  cleared by the [place] FDA and has been authorized for detection and/or diagnosis of SARS-CoV-2 by FDA under an Emergency Use Authorization (EUA).  This EUA will remain in effect (meaning this  test can be used) for the duration of the COVID-19 declaration under Section 564(b)(1) of the Act, 21 U.S.C. section 360bbb-3(b)(1), unless the authorization is terminated or revoked sooner.  Performed at [facility], [address], [place], [place] [address]          Radiology Studies: No results found.      Scheduled Meds: .  stroke: mapping our early stages of recovery book   Does not apply Once  . aspirin EC  81 mg Oral Daily  . atorvastatin  80 mg Oral q1800  . clopidogrel  75 mg Oral Daily  . enoxaparin (LOVENOX) injection  40 mg Subcutaneous Daily  . midodrine  5 mg Oral TID WC   Continuous Infusions:   LOS: 3 days    Time spent: 34 minutes spent on chart review, discussion with nursing staff, consultants, updating family and interview/physical exam; more than 50% of that time was spent in counseling and/or coordination of care.    Alvira Philips [place], DO [facility] Available via Epic secure chat 7am-7pm After these hours, please refer to coverage provider listed on [email or web address] 05/02/2020, 1:43 PM

## 2020-05-03 DIAGNOSIS — I639 Cerebral infarction, unspecified: Secondary | ICD-10-CM

## 2020-05-03 LAB — GLUCOSE, CAPILLARY: Glucose-Capillary: 157 mg/dL — ABNORMAL HIGH (ref 70–99)

## 2020-05-03 MED ORDER — MIDODRINE HCL 5 MG PO TABS
10.0000 mg | ORAL_TABLET | Freq: Three times a day (TID) | ORAL | Status: DC
  Administered 2020-05-03 – 2020-05-05 (×7): 10 mg via ORAL
  Filled 2020-05-03 (×8): qty 2

## 2020-05-03 MED ORDER — DICLOFENAC SODIUM 1 % EX GEL
2.0000 g | Freq: Four times a day (QID) | CUTANEOUS | Status: DC
  Administered 2020-05-03 – 2020-05-05 (×6): 2 g via TOPICAL
  Filled 2020-05-03 (×2): qty 100

## 2020-05-03 MED ORDER — SODIUM CHLORIDE 0.9 % IV BOLUS
500.0000 mL | Freq: Once | INTRAVENOUS | Status: AC
  Administered 2020-05-03: 500 mL via INTRAVENOUS

## 2020-05-03 NOTE — Code Documentation (Signed)
Stroke Response Nurse Documentation Code Documentation  Annaelle Kasel is a 79 y.o. female admitted to [facility] on 8/18 with complaints of stroke noted at home by family. Pt had only left facial droop left upon admission. Code stroke was activated by RN after she noted a worsening facial droop, left sided weakness, and trouble speaking while patient was sitting in the chair this morning at 0805. Pt had been sitting up in her bed this morning eating breakfast. The nurse helped move the patient to the chair. While in the chair, the patient was noted to have worsening facial droop, left sided weakness, and aphasia. SBP noted to be 65 at this time. Pt helped back into the bed, and the nurse called Rapid Response. Repeat SBP while in bed 109. Pt's symptoms started to resolve a this time. Charge RN came into room and called Code Stroke upon seeing facial droop.   Stroke Team arrived at bedside. Lelon Mast RN reported that she was not a Code Stroke due to her being a stroke upon admission and symptoms just worsening. Code Stroke cancelled by Charge RN at time of arrival. Pt evaluated and noted to have NIHSS 2 - Left facial droop and some decreased fluency in speech. RN reports that she was back to her baseline. Pt had a known M3 stenosis per CTA read.   MD Lindzen called and told patient's BP changes along with symptoms. Reported not to get a CT Head at this time. Requested primary RN to call the Admitting Physician and he could consult neurology if needed. Plan: Start Fluids, Q2 mNIHSS/VS, and consult neurology if needed.     Lucila Maine  Stroke Response RN

## 2020-05-03 NOTE — Progress Notes (Signed)
Inpatient Rehab Admissions Coordinator:   I met with Pt. At bedside and she expressed continue interest in CIR admission. I have confirmed 24/7 support at d/c with her sisters [name] and Roxie. Per conversation with PT this AM, Pt. Progressing well, more alert than at eval, and is appropriate for CIR. Will open a case with pt.'s insurance once updated PT notes are in.

## 2020-05-03 NOTE — Progress Notes (Signed)
Physical Therapy Treatment Patient Details Name: Kristin Rush MRN: [id number] DOB: Jan 04, 1941 Today's Date: 05/03/2020    History of Present Illness Kristin Rush is a 79 y.o. female who was noted to develop facial droop sometime after 3 PM.  She has a history of stroke with no residual deficits as well as memory loss.recently admission for recurrent syncope with orthostatic hypotension, CHF.Since last discharge last month patient has been largely bedbound as per the patient's sister. 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    PT Comments    The pt is making good progress with mobility and activity tolerance at this time. She was more responsive and engaged in this session compared to initial evaluation, and was able to tolerate multiple transfers and short ambulation in the room with BP stable. The pt continues to benefit from significant assist to maintain upright balance as she has a strong posterior lean with sit-stand and gait, and poor coordination and motor planning with mobility. Given her improved tolerance for activity at this session and good family support, I feel this pt is a good candidate for CIR level therapies at this time.     Follow Up Recommendations  CIR     Equipment Recommendations   (defer to post acute)    Recommendations for Other Services       Precautions / Restrictions Precautions Precautions: Fall Precaution Comments: orthostatic, L neglect/inattention, R-sided gaze Restrictions Weight Bearing Restrictions: No    Mobility  Bed Mobility Overal bed mobility: Needs Assistance Bed Mobility: Supine to Sit     Supine to sit: Min assist     General bed mobility comments: minA with repeated verbal cues to perform sequencing of UE and BLE movement. minA to raise trunk from EOB but pt with good  initiation of movement when cued. Able to initiate scoot to EOB with good use of BLE  Transfers Overall transfer level: Needs assistance Equipment used: Rolling walker (2 wheeled) Transfers: Sit to/from [employer] Sit to Stand: Mod assist Stand pivot transfers: Mod assist       General transfer comment: VC for hand placement, strong posterior lean, pt bracing BLE on bed/chair. Strong posterior lean when approaching chair as the pt takes multiple posterior steps and is unable to maintain upright without mod/maxA despite cues  Ambulation/Gait Ambulation/Gait assistance: Mod assist Gait Distance (Feet): 15 Feet Assistive device: Rolling walker (2 wheeled) Gait Pattern/deviations: Step-to pattern;Shuffle;Narrow base of support;Leaning posteriorly Gait velocity: decreased Gait velocity interpretation: <1.31 ft/sec, indicative of household ambulator General Gait Details: pt with continued strong posterior lean, able to take small pivoting steps with mod/maxA to maintain upright becasue of retropulsion, but was able to ambulate forwards with min/modA only to facilitate navigation and maintain upright   Stairs             Wheelchair Mobility    Modified Rankin (Stroke Patients Only) Modified Rankin (Stroke Patients Only) Pre-Morbid Rankin Score: Moderately severe disability Modified Rankin: Moderately severe disability     Balance Overall balance assessment: Needs assistance Sitting-balance support: Bilateral upper extremity supported;Feet supported Sitting balance-Leahy Scale: Poor Sitting balance - Comments: posterior lean, sometimes she could correct with cues other times she needed A to correct Postural control: Posterior lean Standing balance support: Bilateral upper extremity supported Standing balance-Leahy Scale: Poor Standing balance comment: BUE support on RW and addtional support of therapist  Cognition  Arousal/Alertness: Awake/alert Behavior During Therapy: Flat affect Overall Cognitive Status: Impaired/Different from baseline Area of Impairment: Following commands;Problem solving;Memory;Safety/judgement                     Memory: Decreased short-term memory Following Commands: Follows one step commands inconsistently;Follows one step commands with increased time Safety/Judgement: Decreased awareness of safety;Decreased awareness of deficits   Problem Solving: Decreased initiation;Difficulty sequencing;Requires verbal cues;Slow processing General Comments: Pt with slowed response to all commands, required repeated cues 60%of the time to complete action or perform correct sequencing.      Exercises      General Comments General comments (skin integrity, edema, etc.): The pt had better engagement, and BP stabilized after initial drop with position change. The pt and her husband would like to pursue CIR      Pertinent Vitals/Pain Pain Assessment: No/denies pain Pain Intervention(s): Monitored during session           PT Goals (current goals can now be found in the care plan section) Acute Rehab PT Goals Patient Stated Goal: to go to CIR PT Goal Formulation: With patient Time For Goal Achievement: 05/14/20 Potential to Achieve Goals: Fair Progress towards PT goals: Progressing toward goals    Frequency    Min 4X/week      PT Plan Discharge plan needs to be updated;Frequency needs to be updated       AM-PAC PT "6 Clicks" Mobility   Outcome Measure  Help needed turning from your back to your side while in a flat bed without using bedrails?: A Little Help needed moving from lying on your back to sitting on the side of a flat bed without using bedrails?: A Little Help needed moving to and from a bed to a chair (including a wheelchair)?: A Lot Help needed standing up from a chair using your arms (e.g., wheelchair or bedside chair)?: A Little Help needed to  walk in hospital room?: A Lot Help needed climbing 3-5 steps with a railing? : A Lot 6 Click Score: 15    End of Session Equipment Utilized During Treatment: Gait belt Activity Tolerance: Patient tolerated treatment well Patient left: with call bell/phone within reach;with family/visitor present;in chair;with chair alarm set Nurse Communication: Mobility status PT Visit Diagnosis: Repeated falls (R29.6);Muscle weakness (generalized) (M62.81);Difficulty in walking, not elsewhere classified (R26.2);Hemiplegia and hemiparesis Hemiplegia - Right/Left: Left Hemiplegia - dominant/non-dominant: Non-dominant Hemiplegia - caused by: Cerebral infarction     Time: 5366-4403 PT Time Calculation (min) (ACUTE ONLY): 36 min  Charges:  $Gait Training: 23-37 mins                     Rolm Baptise, PT, DPT   Acute Rehabilitation Department Pager #: [phone]   Gaetana Michaelis 05/03/2020, 12:52 PM

## 2020-05-03 NOTE — Progress Notes (Signed)
PROGRESS NOTE    Kristin Rush  PIR:[id number] DOB: 03/26/1941 DOA: 04/28/2020 PCP: Eartha Inch, MD    Brief Narrative:  Kristin Rush is a 79 year old female with recent admission for recurrent syncope with orthostatic hypotension, very mild cognitive difficulties, chronic diastolic CHF, who lives with her sister who is being brought to the hospital after sister mentioned that she has had left facial droop yesterday afternoon.  Patient sister also tells me that she has had difficulties moving her legs and reports that almost on a daily basis the patient has some spells in which she stares in space and is unable to move for short period of time.  Since discharge last month patient has been largely bedbound.  In the ED an MRI of the brain confirmed acute stroke involving the right corona radiata, also showed hydrocephalus.  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  MR brain notable for acute stroke right corona radiata.  Neurology was consulted and followed during the hospital course.  CT angiogram head/neck without significant carotid or vertebral artery stenosis.  EEG with no epileptiform/seizure-like activity.  Lipid panel with LDL 183, total cholesterol 261, HDL 66, triglycerides 62.  Hemoglobin A1c 5.5. --PT/OT recommend SNF, patient/sister refusing and requests CIR evaluation; evaluation pending --Plavix 75 mg p.o. daily for 3 weeks --Aspirin 81 mg p.o. daily --Atorvastatin 80 mg p.o. daily --Dysphagia 1 pured, honey thickened liquid diet; meds crushed with pure --Continue monitor on telemetry  History of orthostatic  hypotension --Increase home midodrine to 10 mg p.o. 3 times daily --Abdominal binder  Chronic diastolic congestive heart failure, compensated TTE 03/17/2020 with LVEF 60-65%, grade 2 diastolic dysfunction, trivial MR, no aortic stenosis. --Not on beta-blocker or ACE inhibitor secondary to significant orthostasis requiring midodrine as above  Anxiety: Alprazolam 0.25 mg p.o. twice daily as needed  Hydrocephalus, chronic Patient has followed with [facility] neurology in [place] in the past, apparently was instructed that she needs a shunt 2 years ago but patient declines.  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  Patient and sister state would like new referral to neurology in [place]. --Outpatient neurology referral placed   DVT prophylaxis: Lovenox Code Status: DNR Family Communication: Sister present at bedside  Disposition Plan:  Status is: Inpatient  Remains inpatient appropriate because:Unsafe d/c plan   Dispo: The patient is from: Home              Anticipated d/c is to: CIR              Anticipated d/c date is: 2 days              Patient currently is medically stable to d/c.   Consultants:   Neurology - signed off 04/30/2020  Procedures:   TTE  Antimicrobials:   None   Subjective:  Rapid response this a.m. for patient with worsening left facial droop hypertension 65/3030 while up to bedside chair.  Patient returned back to her bed with improvement of her blood pressure to 109/51, given 500 mL bolus.  Etiology likely secondary to persistent orthostasis.  Patient appears back to her normal baseline currently.  Husband present,  and updated on treatment plan.  Inpatient rehab authorization.  No other concerns or complaints at this time. No chest pain, no palpitations, no shortness of breath, no abdominal pain.  No acute events overnight per nursing staff.  Objective: Vitals:   05/03/20 0509 05/03/20  0807 05/03/20 0811 05/03/20 1223  BP: (!) 142/89 (!) 65/33 (!) 109/51 (!) 157/68  Pulse: 69  79 72  Resp: 17  16 16   Temp: 98.2 F (36.8 C)  98 F (36.7 C) 97.8 F (36.6 C)  TempSrc: Oral  Oral   SpO2:   98% 97%  Weight:        Intake/Output Summary (Last 24 hours) at 05/03/2020 1344 Last data filed at 05/03/2020 0800 Gross per 24 hour  Intake 50 ml  Output --  Net 50 ml   Filed Weights   04/28/20 1600 04/30/20 0018  Weight: 59.1 kg 58.8 kg    Examination:  General exam: Appears calm and comfortable, dysarthric Respiratory system: Clear to auscultation. Respiratory effort normal.  Oxygenating well on room air Cardiovascular system: S1 & S2 heard, RRR. No JVD, murmurs, rubs, gallops or clicks. No pedal edema. Gastrointestinal system: Abdomen is nondistended, soft and nontender. No organomegaly or masses felt. Normal bowel sounds heard. Central nervous system: Alert and oriented.  Facial droop noted Extremities: Symmetric 5 x 5 power. Skin: No rashes, lesions or ulcers Psychiatry: Judgement and insight appear normal. Mood & affect appropriate.     Data Reviewed: I have personally reviewed following labs and imaging studies  CBC: Recent Labs  Lab 04/28/20 1649 04/28/20 1651 05/01/20 0328  WBC  --  4.3 4.1  NEUTROABS  --  2.4  --   HGB 12.6 12.3 11.5*  HCT 37.0 37.5 35.2*  MCV  --  94.2 93.6  PLT  --  158 135*   Basic Metabolic Panel: Recent Labs  Lab 04/28/20 1649 04/28/20 1651 05/01/20 0328  NA 137 136 140  K 4.3 4.4 4.4  CL 97* 100 106  CO2  --  28 26  GLUCOSE 87 91 93  BUN 13 12 13   CREATININE 1.00 0.92 0.91  CALCIUM  --  9.3 8.8*   GFR: Estimated Creatinine Clearance: 42.8 mL/min (by C-G formula based on SCr of 0.91 mg/dL). Liver Function Tests: Recent Labs  Lab 04/28/20 1651  AST 27  ALT 23  ALKPHOS 88  BILITOT 0.7  PROT 6.2*  ALBUMIN 3.6   No results for input(s): LIPASE, AMYLASE in the last 168 hours. No results for input(s): AMMONIA  in the last 168 hours. Coagulation Profile: Recent Labs  Lab 04/28/20 1651  INR 0.9   Cardiac Enzymes: No results for input(s): CKTOTAL, CKMB, CKMBINDEX, TROPONINI in the last 168 hours. BNP (last 3 results) No results for input(s): PROBNP in the last 8760 hours. HbA1C: No results for input(s): HGBA1C in the last 72 hours. CBG: Recent Labs  Lab 04/28/20 1641 05/03/20 0820  GLUCAP 83 157*   Lipid Profile: No results for input(s): CHOL, HDL, LDLCALC, TRIG, CHOLHDL, LDLDIRECT in the last 72 hours. Thyroid Function Tests: No results for input(s): TSH, T4TOTAL, FREET4, T3FREE, THYROIDAB in the last 72 hours. Anemia Panel: No results for input(s): VITAMINB12, FOLATE, FERRITIN, TIBC, IRON, RETICCTPCT in the last 72 hours. Sepsis Labs: No results for input(s): PROCALCITON, LATICACIDVEN in the last 168 hours.  Recent Results (from the past 240 hour(s))  SARS Coronavirus 2 by RT PCR (hospital order, performed in [facility] hospital lab) Nasopharyngeal Nasopharyngeal Swab  Status: None   Collection Time: 04/28/20  8:12 PM   Specimen: Nasopharyngeal Swab  Result Value Ref Range Status   SARS Coronavirus 2 NEGATIVE NEGATIVE Final    Comment: (NOTE) SARS-CoV-2 target nucleic acids are NOT DETECTED.  The SARS-CoV-2 RNA is generally detectable in upper and lower respiratory specimens during the acute phase of infection. The lowest concentration of SARS-CoV-2 viral copies this assay can detect is 250 copies / mL. A negative result does not preclude SARS-CoV-2 infection and should not be used as the sole basis for treatment or other patient management decisions.  A negative result may occur with improper specimen collection / handling, submission of specimen other than nasopharyngeal swab, presence of viral mutation(s) within the areas targeted by this assay, and inadequate number of viral copies (<250 copies / mL). A negative result must be combined with clinical observations,  patient history, and epidemiological information.  Fact Sheet for Patients:   BoilerBrush.com.cy  Fact Sheet for Healthcare Providers: https://pope.com/  This test is not yet approved or  cleared by the [place] FDA and has been authorized for detection and/or diagnosis of SARS-CoV-2 by FDA under an Emergency Use Authorization (EUA).  This EUA will remain in effect (meaning this test can be used) for the duration of the COVID-19 declaration under Section 564(b)(1) of the Act, 21 U.S.C. section 360bbb-3(b)(1), unless the authorization is terminated or revoked sooner.  Performed at [facility], [address], [place], [place] [address]          Radiology Studies: No results found.      Scheduled Meds:   stroke: mapping our early stages of recovery book   Does not apply Once   aspirin EC  81 mg Oral Daily   atorvastatin  80 mg Oral q1800   clopidogrel  75 mg Oral Daily   diclofenac Sodium  2 g Topical QID   enoxaparin (LOVENOX) injection  40 mg Subcutaneous Daily   midodrine  10 mg Oral TID WC   Continuous Infusions:   LOS: 4 days    Time spent: 39 minutes spent on chart review, discussion with nursing staff, consultants, updating family and interview/physical exam; more than 50% of that time was spent in counseling and/or coordination of care.    Alvira Philips [place], DO [facility] Available via Epic secure chat 7am-7pm After these hours, please refer to coverage provider listed on [email or web address] 05/03/2020, 1:44 PM

## 2020-05-03 NOTE — Consult Note (Addendum)
Physical Medicine and Rehabilitation Consult Reason for Consult: Decreased functional mobility with facial droop Referring Physician: [facility]   HPI: Kristin Rush is a 79 y.o. 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  Per chart review patient lives with spouse.  1 level home.  Ambulated with a rolling walker and assistance as well as was receiving home health therapies.  She needed basic assistance for ADLs.  Husband and multiple siblings assist as needed.  Presented 04/28/2020 with facial droop and decreased functional mobility.  CT/MRI showed acute infarct in the right corona radiata and deep white matter tracts.  Mild to moderate ventricular enlargement compatible with suspect hydrocephalus and ventricle size stable since recent MRI of 04/03/2020.  Patient did not receive TPA.  CT angiogram of head and neck no significant carotid or vertebral artery stenosis.  Negative for intracranial large vessel occlusion.  Most recent echocardiogram with ejection fraction of 60 to 65% no wall motion abnormalities.  EEG negative for seizure.  Admission chemistries unremarkable, SARS coronavirus negative, urinalysis negative nitrite.  Currently maintained on aspirin and Plavix for CVA prophylaxis.  Subcutaneous Lovenox for DVT prophylaxis.  She remains on ProAmatine for history of orthostasis.  Dysphagia #1 honey thick liquid diet.  Therapy evaluations completed with recommendations of physical medicine rehab consult.   Review of Systems  Constitutional: Negative for chills and fever.  HENT: Positive for hearing loss.   Eyes: Negative for blurred vision and double vision.  Respiratory: Negative for shortness of breath.   Cardiovascular: Negative for chest pain, palpitations and leg swelling.  Gastrointestinal: Positive  for constipation. Negative for heartburn, nausea and vomiting.  Genitourinary: Negative for dysuria, flank pain and hematuria.  Musculoskeletal: Positive for back pain and myalgias.  Skin: Negative for rash.  Psychiatric/Behavioral: Positive for memory loss. The patient has insomnia.   All other systems reviewed and are negative.  Past Medical History:  Diagnosis Date  . 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 Lumbar spondylosis 02/18/2018  . Migraine   . Migraines 10/09/2011  . Orthostatic hypotension 10/23/2016  . Osteoporosis 04/16/2014  . Primary osteoarthritis of both hands 07/11/2017   History reviewed. No pertinent surgical history. Family History  Family history unknown: Yes   Social History:  reports that she has never smoked. She has never used smokeless tobacco. She reports that she does not drink alcohol and does not use drugs. Allergies:  Allergies  Allergen Reactions  . Codeine Nausea And Vomiting  . Sulfa Antibiotics    Medications Prior to Admission  Medication Sig Dispense Refill  . acetaminophen (TYLENOL) 500 MG tablet Take 500 mg by mouth daily as needed for mild pain.     . calcium-vitamin D (OSCAL WITH D) 500-200 MG-UNIT per tablet Take 1 tablet by mouth daily.    . cyanocobalamin 1000 MCG tablet Take 1,000 mcg by mouth daily.    Marland Kitchen escitalopram (LEXAPRO) 10 MG tablet Take 10 mg by mouth daily.    Marland Kitchen ibuprofen (ADVIL,MOTRIN) 200 MG tablet Take 600 mg by mouth daily as needed for moderate pain. For pain      . midodrine (PROAMATINE) 2.5 MG tablet Take 3 tablets (7.5 mg total) by mouth 2 (two) times daily with breakfast and lunch. (Patient taking differently: Take 2.5 mg by mouth at bedtime. ) 60 tablet 0  .  midodrine (PROAMATINE) 5 MG tablet Take 1 tablet (5 mg total) by mouth at bedtime. (Patient taking differently: Take 5 mg by mouth 2 (two) times daily with a meal. ) 30 tablet 0  . Multiple Vitamins-Minerals  (PRESERVISION AREDS) TABS Take 1 tablet by mouth daily.    . ondansetron (ZOFRAN) 4 MG tablet Take 1 tablet (4 mg total) by mouth every 6 (six) hours as needed for nausea. 20 tablet 0  . polyethylene glycol (MIRALAX / GLYCOLAX) 17 g packet Take 17 g by mouth daily as needed for mild constipation. 14 each 0    Home: Home Living Family/patient expects to be discharged to:: Inpatient rehab Living Arrangements: Other relatives Available Help at Discharge: Available 24 hours/day Type of Home: House Home Access: Ramped entrance Home Layout: One level Bathroom Shower/Tub: Engineer, manufacturing systems: Handicapped height Bathroom Accessibility: Yes Home Equipment: Environmental consultant - 2 wheels, Walker - 4 wheels, Cane - single point, Grab bars - toilet, Tub bench, Grab bars - tub/shower, Hand held shower head  Lives With:  (alone (but sister lives in her basement apartment), husband lives in house next door)  Functional History: Prior Function Level of Independence: Needs assistance Gait / Transfers Assistance Needed: Pt ambulating with RW and assist of at least 1 person for all mobility. Was receiving HHPT PTA, was instructed not to stand without assist ADL's / Homemaking Assistance Needed: pt receives assist to dress (bra, socks shoes), sits on shower bench for showers with assist of sisters to wash (she can wash UB), uses BSC at bedside and toilet riser in bathroom with A Functional Status:  Mobility: Bed Mobility Overal bed mobility: Needs Assistance Bed Mobility: Supine to Sit Supine to sit: Min assist (with Mod A to scoot to EOB) Sit to supine: Max assist General bed mobility comments: The pt was able to pull to sit with min/modA to move BLE and modA to raise her trunk from EOB. She was unable to initiate return to supine despite increased processing time and cues, and required maxA to return to bed. Transfers Overall transfer level: Needs assistance Equipment used: Rolling walker (2  wheeled) Transfers: Sit to/from Stand, [employer] Transfers Sit to Stand: Mod assist Stand pivot transfers: Mod assist General transfer comment: posterior lean, shuffling steps, close to sitting prematurely in chair on her left Ambulation/Gait Ambulation/Gait assistance: Mod assist Gait Distance (Feet): 3 Feet Assistive device: Rolling walker (2 wheeled) Gait Pattern/deviations: Step-to pattern, Shuffle, Narrow base of support General Gait Details: pt with continued strong lateral and posterior lean, able to take small steps to [facility] with assist and RW in addition to assist for RW management Gait velocity interpretation: <1.31 ft/sec, indicative of household ambulator    ADL: ADL Overall ADL's : Needs assistance/impaired Eating/Feeding: Moderate assistance Eating/Feeding Details (indicate cue type and reason): supported sitting Grooming: Maximal assistance Grooming Details (indicate cue type and reason): supported sitting Upper Body Bathing: Maximal assistance Upper Body Bathing Details (indicate cue type and reason): supported sitting Lower Body Bathing: Maximal assistance Lower Body Bathing Details (indicate cue type and reason): Mod A sit<>stand (posterior lean) Upper Body Dressing : Total assistance Upper Body Dressing Details (indicate cue type and reason): supported sitting Lower Body Dressing: Total assistance Lower Body Dressing Details (indicate cue type and reason): Mod A sit<>stand (posterior lean) Toilet Transfer: Moderate assistance, Stand-pivot, RW Toilet Transfer Details (indicate cue type and reason): bed>recliner (posterior lean, shuffling steps, almost sitting prematurely) Toileting- Clothing Manipulation and Hygiene: Total assistance Toileting - Clothing Manipulation  Details (indicate cue type and reason): Mod A sit<>stand (posterior lean)  Cognition: Cognition Overall Cognitive Status: Impaired/Different from baseline Arousal/Alertness: Awake/alert Orientation  Level: Oriented to person, Oriented to place Attention: Focused, Sustained Focused Attention: Impaired Focused Attention Impairment: Verbal basic Sustained Attention: Impaired Sustained Attention Impairment: Verbal basic Memory: Impaired Memory Impairment: Retrieval deficit, Storage deficit, Decreased recall of new information (Immediate: 2/5; delayed: 1/5 with cues: 0/4) Awareness: Impaired Awareness Impairment: Intellectual impairment Problem Solving: Impaired Problem Solving Impairment: Verbal complex Executive Function: Reasoning, Sequencing, Organizing Reasoning: Impaired Reasoning Impairment: Verbal complex (Safety: 2/3) Sequencing: Impaired Sequencing Impairment: Verbal complex (Clock drawing: 0/4) Organizing: Impaired Organizing Impairment: Verbal complex (Backward digit span: 0/3) Cognition Arousal/Alertness: Awake/alert Behavior During Therapy: Flat affect Overall Cognitive Status: Impaired/Different from baseline Area of Impairment: Following commands, Problem solving, Memory, Safety/judgement, Orientation Orientation Level: Disoriented to, Place, Time, Situation (didn't know where she was even with choices, got month but not year (2001 then self corrected to  2010)) Current Attention Level: Focused Memory: Decreased short-term memory Following Commands: Follows one step commands inconsistently Safety/Judgement: Decreased awareness of safety, Decreased awareness of deficits Problem Solving: Decreased initiation, Difficulty sequencing, Requires verbal cues General Comments: Pt more awake than when PT saw her earlier, definite left inattention possibly field cut  Blood pressure (!) 142/89, pulse 69, temperature 98.2 F (36.8 C), temperature source Oral, resp. rate 17, weight 58.8 kg, SpO2 98 %.  Physical Exam  General: Alert and oriented x 2 (to person and place), No apparent distress HEENT: Head is normocephalic, atraumatic, PERRLA, EOMI, sclera anicteric, oral mucosa  pink and moist, dentition intact, ext ear canals clear,  Neck: Supple without JVD or lymphadenopathy Heart: Reg rate and rhythm. No murmurs rubs or gallops Chest: CTA bilaterally without wheezes, rales, or rhonchi; no distress Abdomen: Soft, non-tender, non-distended, bowel sounds positive. Extremities: No clubbing, cyanosis, or edema. Pulses are 2+ Skin: Clean and intact without signs of breakdown Neuro: Patient is alert in no acute distress.  Makes eye contact with examiner follows commands.  Speech is mildly dysarthric but intelligible.  Provides her name and place but limited medical historian. 4+/5 strength throughout.  MSK: Bilateral hand OA Psych: Pt's affect is appropriate. Pt is cooperative   No results found for this or any previous visit (from the past 24 hour(s)). No results found.   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  1. Does the need for close, 24 hr/day medical supervision in concert with the patient's rehab needs make it unreasonable for this patient to be served in a less intensive setting? Yes 2. Co-Morbidities requiring supervision/potential complications: orthostatic hypotension (pre-morbid), hypertension, migraines, lumbar spondylosis, osteoporosis, primary OA of both hands, non-pressure hydrocephalus 3. Due to bladder management, bowel management, safety, skin/wound care, disease management, medication administration, pain management and patient education, does the patient require 24 hr/day rehab nursing? Yes 4. Does the patient require coordinated care of a physician, rehab nurse, therapy disciplines of PT, OT, SLP to address physical and functional deficits in the context of the above medical diagnosis(es)? Yes Addressing deficits in the following areas: balance, endurance, locomotion, strength, transferring, bowel/bladder control, bathing, dressing, feeding, grooming, toileting, cognition, swallowing and psychosocial  support 5. Can the patient actively participate in an intensive therapy program of at least 3 hrs of therapy per day at least 5 days per week? Yes 6. The potential for patient to make measurable gains while on inpatient rehab is excellent 7. Anticipated functional outcomes upon discharge from inpatient rehab are  modified independent  with PT, modified independent with OT, modified independent with SLP. 8. Estimated rehab length of stay to reach the above functional goals is: 10-14 days 9. Anticipated discharge destination: Home 10. Overall Rehab/Functional Prognosis: excellent  RECOMMENDATIONS: This patient's condition is appropriate for continued rehabilitative care in the following setting: CIR Patient has agreed to participate in recommended program. Yes Note that insurance prior authorization may be required for reimbursement for recommended care.  Comment: Mrs. Fuson would be an excellent CIR candidate.   She would benefit from diclofenac gel for her hand OA (ordered).   She would benefit from an abdominal binder when working with therapy (ordered).  Thank you for this consult. Admission coordinator to follow.    Mcarthur Rossetti Angiulli, PA-C 05/03/2020   I have personally performed a face to face diagnostic evaluation, including, but not limited to relevant history and physical exam findings, of this patient and developed relevant assessment and plan.  Additionally, I have reviewed and concur with the physician assistant's documentation above.  Sula Soda, MD

## 2020-05-03 NOTE — Significant Event (Signed)
Rapid Response Event Note   Reason for Call :  Worsening left facial droop, hypotension 65/33 while up to chair  Initial Focused Assessment:  Pt lying in bed. Awake, oriented. Follows commands. EOMI. Grips equal, plantar flexion equal. No drift and pt is not ataxic. Pt has some decreased speech fluidity and mild left facial droop- per pt and RN this is baseline.   BP improved to 109/51 once back to bed. Facial droop improved.  VS: T 98, BP 109/51, HR 79, RR 16, SpO2 98% on room air  CBG: 157  Interventions:  -500 cc bolus -Recommend maintenance fluids  Plan of Care:  -Q2H mNIHSS -Maintenance fluids  Call rapid response for additional needs.  Event Summary:  MD Notified: Dr. [place] Call Time: [phone] Arrival Time: 0814 End Time: 0845  Jennye Moccasin, RN

## 2020-05-03 NOTE — Progress Notes (Signed)
Called into room by daughter who said her mother all of sudden could not speak and was having severe left facial droop. Patient had been currently sitting up in chair eating breakfast. Blood pressure check revealed a pressure of 65/33. Patient was not really speaking and anything she did say was very slurred. Patient had severe left sided weakness and left sided facial droop. A code stroke was called. We assisted patient back to bed and pressure came up to 109/51 and symptoms began resolving.   Stroke team came to assess patient. They spoke with Neuro who recommended fluids and to contact attending. Dr. [place] here to see patient. We will give NS bolus, check Q2 hour neuro checks X 2 and check orthostatics once fluids are complete. Will continue to closely monitor patient. Family was at bedside during this entire event.

## 2020-05-04 NOTE — Progress Notes (Signed)
Physical Therapy Treatment Patient Details Name: Kristin Rush MRN: [id number] DOB: January 02, 1941 Today's Date: 05/04/2020    History of Present Illness Kristin Rush is a 79 y.o. female who was noted to develop facial droop sometime after 3 PM.  She has a history of stroke with no residual deficits as well as memory loss.recently admission for recurrent syncope with orthostatic hypotension, CHF.Since last discharge last month patient has been largely bedbound as per the patient's sister. 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    PT Comments    Patient was received laying in hospital bed at the start of the physical therapy session. She required MinAx1 in bed mobility, transfers, and gait. Needed repeated cueing to complete tasks and some physical assistance for body positioning. During ambulation patient had a left lean and difficulty shifting her weight to the right. Marches in place at recliner chair to help encourage weight shift to right side and improve functional standing balance. Monitored orthostatics throughout the session. BP dropped after ambulation (was upright during activities for 3-5 mins) and patient reported some dizziness. See below. Elevated legs and laid her flat in recliner chair to help increase perfusion. BP returned WNL at end of session. Continue to recommend CIR to address deficits in strength, balance, cardiovascular endurance, and gait training.     05/04/20 1332  Orthostatic Lying   BP- Lying 165/79  Orthostatic Sitting  BP- Sitting 160/55  Orthostatic Standing at 0 minutes  BP- Standing at 0 minutes 160/55  Pulse- Standing at 0 minutes 115  Orthostatic Standing at 3 minutes  BP- Standing at 3 minutes 104/54 (symptomatic for dizziness)  Pulse- Standing at 3 minutes 83       Follow Up  Recommendations  CIR     Equipment Recommendations  Rolling walker with 5" wheels;3in1 (PT)    Recommendations for Other Services       Precautions / Restrictions Precautions Precautions: Fall Precaution Comments: orthostatic, L neglect/inattention, R-sided gaze Restrictions Weight Bearing Restrictions: No    Mobility  Bed Mobility Overal bed mobility: Needs Assistance Bed Mobility: Supine to Sit     Supine to sit: Min assist     General bed mobility comments: minA with repeated cueing to move legs off EOB and physical assistance to raise trunk to upright position.  Transfers Overall transfer level: Needs assistance Equipment used: Rolling walker (2 wheeled) Transfers: Sit to/from Stand Sit to Stand: Min assist         General transfer comment: repeated cues to transfer and stand in place instead of walk to obtain BP measurement. Needed some physical assistance for power up  Ambulation/Gait Ambulation/Gait assistance: Min assist Gait Distance (Feet): 10 Feet Assistive device: Rolling walker (2 wheeled) Gait Pattern/deviations: Step-to pattern;Decreased weight shift to right Gait velocity: decreased   General Gait Details: lean to left when walking, difficulty with R weight shift, BP dropped during ambulation, dizzy afterwards   Stairs             Wheelchair Mobility    Modified Rankin (Stroke Patients Only) Modified Rankin (Stroke Patients Only) Pre-Morbid Rankin Score: Moderate disability Modified Rankin: Moderate disability     Balance Overall balance assessment: Needs assistance Sitting-balance support: Bilateral upper extremity supported;Feet supported Sitting balance-Leahy Scale: Fair Sitting balance - Comments: able to hold upright posture while sitting EOB with UE use   Standing balance support: Bilateral  upper extremity supported;During functional activity Standing balance-Leahy Scale: Poor Standing balance comment: BUE support on RW  and addtional support of therapist. poor weight shift to right side                            Cognition Arousal/Alertness: Awake/alert Behavior During Therapy: WFL for tasks assessed/performed Overall Cognitive Status: Impaired/Different from baseline Area of Impairment: Following commands;Problem solving;Memory;Safety/judgement                   Current Attention Level: Sustained Memory: Decreased short-term memory Following Commands: Follows one step commands consistently;Follows one step commands with increased time Safety/Judgement: Decreased awareness of safety;Decreased awareness of deficits   Problem Solving: Decreased initiation;Difficulty sequencing;Requires verbal cues;Slow processing General Comments: Pt with slowed response to all commands, required repeated cues to complete action or perform correct sequencing.      Exercises      General Comments General comments (skin integrity, edema, etc.): BP dropped from 160/55 too 104/54 with ambulation. returned to 142/56 at end of session      Pertinent Vitals/Pain Pain Assessment: No/denies pain Pain Intervention(s): Monitored during session;Limited activity within patient's tolerance    Home Living                      Prior Function            PT Goals (current goals can now be found in the care plan section) Acute Rehab PT Goals Patient Stated Goal: to go to CIR PT Goal Formulation: With patient Time For Goal Achievement: 05/14/20 Potential to Achieve Goals: Good Progress towards PT goals: Progressing toward goals    Frequency    Min 4X/week      PT Plan Current plan remains appropriate    Co-evaluation              AM-PAC PT "6 Clicks" Mobility   Outcome Measure  Help needed turning from your back to your side while in a flat bed without using bedrails?: A Little Help needed moving from lying on your back to sitting on the side of a flat bed without using  bedrails?: A Little Help needed moving to and from a bed to a chair (including a wheelchair)?: A Lot Help needed standing up from a chair using your arms (e.g., wheelchair or bedside chair)?: A Little Help needed to walk in hospital room?: A Lot Help needed climbing 3-5 steps with a railing? : A Lot 6 Click Score: 15    End of Session Equipment Utilized During Treatment: Gait belt Activity Tolerance: Patient tolerated treatment well Patient left: with call bell/phone within reach;with family/visitor present;in chair;with chair alarm set Nurse Communication: Mobility status PT Visit Diagnosis: Repeated falls (R29.6);Muscle weakness (generalized) (M62.81);Difficulty in walking, not elsewhere classified (R26.2);Hemiplegia and hemiparesis Hemiplegia - Right/Left: Left Hemiplegia - dominant/non-dominant: Non-dominant Hemiplegia - caused by: Cerebral infarction     Time: 4403-4742 PT Time Calculation (min) (ACUTE ONLY): 24 min  Charges:  $Gait Training: 8-22 mins $Therapeutic Activity: 8-22 mins                    Elisha Ponder, SPT, ATC

## 2020-05-04 NOTE — Progress Notes (Signed)
  Speech Language Pathology Treatment: Dysphagia;Cognitive-Linquistic  Patient Details Name: Kristin Rush MRN: [id number] DOB: 24-Apr-1941 Today's Date: 05/04/2020 Time: 5027-7412 SLP Time Calculation (min) (ACUTE ONLY): 17 min  Assessment / Plan / Recommendation Clinical Impression  Pt was seen during lunch meal with Min cues needed to take smaller cup sips. Pt took smaller bites of purees with Mod I, but at times would let too much food build up in her mouth. Coughing was noted x1 as a result. Occasional coughing or throat clearing was noted after sips of honey thick liquids as well, but pt's sister says that coughing has been significantly reduced since the pt was started on a modified diet. Throughout meal, pt also needed Mod cues for intellectual awareness of acute changes s/p CVA. Min cues were provided for completion of one-step commands, and Mod-Max cues were needed for recall of daily events. She would benefit from CIR-level f/u post-discharge to maximize functional independence and safety with swallowing.    HPI HPI: 79 y.o. female with history of recently admission for recurrent syncope with orthostatic hypotension, CHF was brought to the ER after patient was found to have a left facial droop noticed around 3 PM.  Patient as per the patient's sister has not been doing well for the last couple of days with difficulty ambulating.  Has been found to have frequent episodes of low blood pressure. MRI of head revealed Acute infarct in the right corona radiata and deep white matter      SLP Plan  Continue with current plan of care       Recommendations  Diet recommendations: Dysphagia 1 (puree);Honey-thick liquid Liquids provided via: Cup;Straw Medication Administration: Crushed with puree Supervision: Staff to assist with self feeding Compensations: Slow rate;Small sips/bites;Effortful swallow;Multiple dry swallows after each bite/sip;Lingual sweep for clearance of pocketing;Clear  throat after each swallow Postural Changes and/or Swallow Maneuvers: Seated upright 90 degrees                Oral Care Recommendations: Oral care BID Follow up Recommendations: Inpatient Rehab SLP Visit Diagnosis: Cognitive communication deficit (R41.841);Dysphagia, oropharyngeal phase (R13.12) Plan: Continue with current plan of care       GO                Mahala Menghini., M.A. CCC-SLP Acute Rehabilitation Services Pager [phone] Office [phone]  05/04/2020, 2:06 PM

## 2020-05-04 NOTE — Progress Notes (Signed)
PROGRESS NOTE    Kristin Rush  XBJ:[id number] DOB: 1941-08-01 DOA: 04/28/2020 PCP: Eartha Inch, MD    Brief Narrative:  Kristin Rush is a 79 year old female with recent admission for recurrent syncope with orthostatic hypotension, very mild cognitive difficulties, chronic diastolic CHF, who lives with her sister who is being brought to the hospital after sister mentioned that she has had left facial droop yesterday afternoon.  Patient sister also tells me that she has had difficulties moving her legs and reports that almost on a daily basis the patient has some spells in which she stares in space and is unable to move for short period of time.  Since discharge last month patient has been largely bedbound.  In the ED an MRI of the brain confirmed acute stroke involving the right corona radiata, also showed hydrocephalus.  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  MR brain notable for acute stroke right corona radiata.  Neurology was consulted and followed during the hospital course.  CT angiogram head/neck without significant carotid or vertebral artery stenosis.  EEG with no epileptiform/seizure-like activity.  Lipid panel with LDL 183, total cholesterol 261, HDL 66, triglycerides 62.  Hemoglobin A1c 5.5. --Plavix 75 mg p.o. daily for 3 weeks --Aspirin 81 mg p.o. daily --Atorvastatin 80 mg p.o. daily --Dysphagia 1 pured, honey thickened liquid diet; meds crushed with pure --Continue monitor on telemetry --PT/OT recommend SNF, patient/sister refusing and requests CIR evaluation; evaluation/insurance authorization pending  History of  orthostatic hypotension --Increased home midodrine to 10 mg p.o. 3 times daily --Abdominal binder  Chronic diastolic congestive heart failure, compensated TTE 03/17/2020 with LVEF 60-65%, grade 2 diastolic dysfunction, trivial MR, no aortic stenosis. --Not on beta-blocker or ACE inhibitor secondary to significant orthostasis requiring midodrine as above  Anxiety: Alprazolam 0.25 mg p.o. twice daily as needed  Hydrocephalus, chronic Patient has followed with [facility] neurology in [place] in the past, apparently was instructed that she needs a shunt 2 years ago but patient declines.  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  Patient and sister state would like new referral to neurology in [place]. --Outpatient neurology referral placed   DVT prophylaxis: Lovenox Code Status: DNR Family Communication: Sister present at bedside  Disposition Plan:  Status is: Inpatient  Remains inpatient appropriate because:Unsafe d/c plan   Dispo: The patient is from: Home              Anticipated d/c is to: CIR              Anticipated d/c date is: 1 day              Patient currently is medically stable to d/c.   Consultants:   Neurology - signed off 04/30/2020  Procedures:   TTE  Antimicrobials:   None   Subjective:  Patient seen and examined bedside, resting comfortably.  Family present.  No complaints this morning.  Eating breakfast.  Awaiting insurance authorization for CIR.  No other complaints or concerns at this time.  Denies headache, no fever/chills/night sweats, no nausea cefonicid diarrhea, no chest pain, no palpitations, no shortness of breath, no abdominal pain.  No acute events overnight per nurse staff.  Objective: Vitals:   05/03/20 2350 05/04/20 0350 05/04/20 0824 05/04/20 1217  BP: (!) 133/54 131/67 (!) 122/57 (!) 104/54  Pulse: 68 77 72 79  Resp: 16 15 16 16   Temp: 98.4 F (36.9 C) 97.9 F (36.6 C)  98.5 F (36.9 C) 97.6 F (36.4 C)  TempSrc: Oral Oral Oral Oral  SpO2: 97% 97% 98% 97%  Weight:       No intake or output data in the 24 hours ending 05/04/20 1326 Filed Weights   04/28/20 1600 04/30/20 0018  Weight: 59.1 kg 58.8 kg    Examination:  General exam: Appears calm and comfortable, dysarthric Respiratory system: Clear to auscultation. Respiratory effort normal.  Oxygenating well on room air Cardiovascular system: S1 & S2 heard, RRR. No JVD, murmurs, rubs, gallops or clicks. No pedal edema. Gastrointestinal system: Abdomen is nondistended, soft and nontender. No organomegaly or masses felt. Normal bowel sounds heard. Central nervous system: Alert and oriented.  Facial droop noted Extremities: Symmetric 5 x 5 power. Skin: No rashes, lesions or ulcers Psychiatry: Judgement and insight appear normal. Mood & affect appropriate.     Data Reviewed: I have personally reviewed following labs and imaging studies  CBC: Recent Labs  Lab 04/28/20 1649 04/28/20 1651 05/01/20 0328  WBC  --  4.3 4.1  NEUTROABS  --  2.4  --   HGB 12.6 12.3 11.5*  HCT 37.0 37.5 35.2*  MCV  --  94.2 93.6  PLT  --  158 135*   Basic Metabolic Panel: Recent Labs  Lab 04/28/20 1649 04/28/20 1651 05/01/20 0328  NA 137 136 140  K 4.3 4.4 4.4  CL 97* 100 106  CO2  --  28 26  GLUCOSE 87 91 93  BUN 13 12 13   CREATININE 1.00 0.92 0.91  CALCIUM  --  9.3 8.8*   GFR: Estimated Creatinine Clearance: 42.8 mL/min (by C-G formula based on SCr of 0.91 mg/dL). Liver Function Tests: Recent Labs  Lab 04/28/20 1651  AST 27  ALT 23  ALKPHOS 88  BILITOT 0.7  PROT 6.2*  ALBUMIN 3.6   No results for input(s): LIPASE, AMYLASE in the last 168 hours. No results for input(s): AMMONIA in the last 168 hours. Coagulation Profile: Recent Labs  Lab 04/28/20 1651  INR 0.9   Cardiac Enzymes: No results for input(s): CKTOTAL, CKMB, CKMBINDEX, TROPONINI in the last 168 hours. BNP (last 3 results) No  results for input(s): PROBNP in the last 8760 hours. HbA1C: No results for input(s): HGBA1C in the last 72 hours. CBG: Recent Labs  Lab 04/28/20 1641 05/03/20 0820  GLUCAP 83 157*   Lipid Profile: No results for input(s): CHOL, HDL, LDLCALC, TRIG, CHOLHDL, LDLDIRECT in the last 72 hours. Thyroid Function Tests: No results for input(s): TSH, T4TOTAL, FREET4, T3FREE, THYROIDAB in the last 72 hours. Anemia Panel: No results for input(s): VITAMINB12, FOLATE, FERRITIN, TIBC, IRON, RETICCTPCT in the last 72 hours. Sepsis Labs: No results for input(s): PROCALCITON, LATICACIDVEN in the last 168 hours.  Recent Results (from the past 240 hour(s))  SARS Coronavirus 2 by RT PCR (hospital order, performed in [facility] hospital lab) Nasopharyngeal Nasopharyngeal Swab     Status: None   Collection Time: 04/28/20  8:12 PM   Specimen: Nasopharyngeal Swab  Result Value Ref Range Status   SARS Coronavirus 2 NEGATIVE NEGATIVE Final    Comment: (NOTE) SARS-CoV-2 target nucleic acids are NOT DETECTED.  The SARS-CoV-2 RNA is  generally detectable in upper and lower respiratory specimens during the acute phase of infection. The lowest concentration of SARS-CoV-2 viral copies this assay can detect is 250 copies / mL. A negative result does not preclude SARS-CoV-2 infection and should not be used as the sole basis for treatment or other patient management decisions.  A negative result may occur with improper specimen collection / handling, submission of specimen other than nasopharyngeal swab, presence of viral mutation(s) within the areas targeted by this assay, and inadequate number of viral copies (<250 copies / mL). A negative result must be combined with clinical observations, patient history, and epidemiological information.  Fact Sheet for Patients:   BoilerBrush.com.cy  Fact Sheet for Healthcare Providers: https://pope.com/  This test is not  yet approved or  cleared by the [place] FDA and has been authorized for detection and/or diagnosis of SARS-CoV-2 by FDA under an Emergency Use Authorization (EUA).  This EUA will remain in effect (meaning this test can be used) for the duration of the COVID-19 declaration under Section 564(b)(1) of the Act, 21 U.S.C. section 360bbb-3(b)(1), unless the authorization is terminated or revoked sooner.  Performed at [facility], [address], [place], [place] [address]          Radiology Studies: No results found.      Scheduled Meds: .  stroke: mapping our early stages of recovery book   Does not apply Once  . aspirin EC  81 mg Oral Daily  . atorvastatin  80 mg Oral q1800  . clopidogrel  75 mg Oral Daily  . diclofenac Sodium  2 g Topical QID  . enoxaparin (LOVENOX) injection  40 mg Subcutaneous Daily  . midodrine  10 mg Oral TID WC   Continuous Infusions:   LOS: 5 days    Time spent: 34 minutes spent on chart review, discussion with nursing staff, consultants, updating family and interview/physical exam; more than 50% of that time was spent in counseling and/or coordination of care.    Alvira Philips [place], DO [facility] Available via Epic secure chat 7am-7pm After these hours, please refer to coverage provider listed on [email or web address] 05/04/2020, 1:26 PM

## 2020-05-05 LAB — CREATININE, SERUM
Creatinine, Ser: 0.9 mg/dL (ref 0.44–1.00)
GFR calc Af Amer: >60 mL/min (ref >60)
GFR calc non Af Amer: >60 mL/min (ref >60)

## 2020-05-05 MED ORDER — MIDODRINE HCL 5 MG PO TABS: 5.0000 mg | ORAL_TABLET | Freq: Two times a day (BID) | ORAL | Status: DC

## 2020-05-05 MED ORDER — ALPRAZOLAM 0.25 MG PO TABS: 0.2500 mg | ORAL_TABLET | Freq: Two times a day (BID) | ORAL | 0 refills | Status: AC | PRN

## 2020-05-05 MED ORDER — ASPIRIN 81 MG PO TBEC: 81.0000 mg | DELAYED_RELEASE_TABLET | Freq: Every day | ORAL | 11 refills | Status: AC

## 2020-05-05 MED ORDER — ATORVASTATIN CALCIUM 80 MG PO TABS: 80.0000 mg | ORAL_TABLET | Freq: Every day | ORAL | 0 refills | Status: AC

## 2020-05-05 MED ORDER — RESOURCE THICKENUP CLEAR PO POWD: ORAL | 1 refills | Status: AC

## 2020-05-05 MED ORDER — MIDODRINE HCL 2.5 MG PO TABS: 2.5000 mg | ORAL_TABLET | Freq: Every day | ORAL | Status: DC

## 2020-05-05 MED ORDER — CLOPIDOGREL BISULFATE 75 MG PO TABS
75.0000 mg | ORAL_TABLET | Freq: Every day | ORAL | 0 refills | Status: DC
Start: 2020-05-06 — End: 2020-06-25

## 2020-05-05 NOTE — Progress Notes (Signed)
Nsg Discharge Note  Admit Date:  04/28/2020 Discharge date: 05/05/2020   Deneene Tarver to be D/C'd Home per MD order.  AVS completed.  Copy for chart, and copy for patient signed, and dated. Patient/caregiver able to verbalize understanding.  Discharge Medication: Allergies as of 05/05/2020      Reactions   Codeine Nausea And Vomiting   Sulfa Antibiotics       Medication List    STOP taking these medications   ibuprofen 200 MG tablet Commonly known as: ADVIL   ondansetron 4 MG tablet Commonly known as: ZOFRAN   polyethylene glycol 17 g packet Commonly known as: MIRALAX / GLYCOLAX     TAKE these medications   acetaminophen 500 MG tablet Commonly known as: TYLENOL Take 500 mg by mouth daily as needed for mild pain.   ALPRAZolam 0.25 MG tablet Commonly known as: XANAX Take 1 tablet (0.25 mg total) by mouth 2 (two) times daily as needed for anxiety.   aspirin 81 MG EC tablet Take 1 tablet (81 mg total) by mouth daily. Swallow whole. Start taking on: May 06, 2020   atorvastatin 80 MG tablet Commonly known as: LIPITOR Take 1 tablet (80 mg total) by mouth daily at 6 PM.   calcium-vitamin D 500-200 MG-UNIT tablet Commonly known as: OSCAL WITH D Take 1 tablet by mouth daily.   clopidogrel 75 MG tablet Commonly known as: PLAVIX Take 1 tablet (75 mg total) by mouth daily. Start taking on: May 06, 2020   cyanocobalamin 1000 MCG tablet Take 1,000 mcg by mouth daily.   escitalopram 10 MG tablet Commonly known as: LEXAPRO Take 10 mg by mouth daily.   midodrine 5 MG tablet Commonly known as: PROAMATINE Take 1 tablet (5 mg total) by mouth 2 (two) times daily with a meal.   midodrine 2.5 MG tablet Commonly known as: PROAMATINE Take 1 tablet (2.5 mg total) by mouth at bedtime.   PreserVision AREDS Tabs Take 1 tablet by mouth daily.   Resource ThickenUp Clear Powd To make liquids honey thick       Discharge Assessment: Vitals:   05/05/20 0803  05/05/20 1230  BP: (!) 129/58 (!) 143/63  Pulse: 79 68  Resp: 16 16  Temp: 98 F (36.7 C) 97.9 F (36.6 C)  SpO2: 94% 96%   Skin clean, dry and intact without evidence of skin break down, no evidence of skin tears noted. IV catheter discontinued intact. Site without signs and symptoms of complications - no redness or edema noted at insertion site, patient denies c/o pain - only slight tenderness at site.  Dressing with slight pressure applied.  D/c Instructions-Education: Discharge instructions given to patient/family with verbalized understanding. D/c education completed with patient/family including follow up instructions, medication list, d/c activities limitations if indicated, with other d/c instructions as indicated by MD - patient able to verbalize understanding, all questions fully answered. Patient instructed to return to ED, call 911, or call MD for any changes in condition.  Patient escorted via WC, and D/C home via private auto.  Boykin Nearing, RN 05/05/2020 4:17 PM

## 2020-05-05 NOTE — Discharge Summary (Signed)
Physician Discharge Summary  Kristin Rush ZOX:[id number] DOB: 11/06/1940 DOA: 04/28/2020  PCP: Kristin Inch, MD  Admit date: 04/28/2020 Discharge date: 05/05/2020  Admitted From: home Discharge disposition: home  Recommendations for Outpatient Follow-Up:   1. Home health 2. 24 hour care 3. Given 5 days worth of xanax (started in hospital)- defer to PCP to continue 4. Asa plus plavix then ASA alone 5. Statin started   Discharge Diagnosis:   Principal Problem:   Acute CVA (cerebrovascular accident) (HCC) Active Problems:   Orthostatic hypotension   CVA (cerebral vascular accident) [facility])    Discharge Condition: Improved.  Diet recommendation: DYS  Wound care: None.  Code status: dnr   History of Present Illness:   Kristin Rush is a 79 y.o. female with history of recently admission for recurrent syncope with orthostatic hypotension, CHF was brought to the ER after patient was found to have a left facial droop noticed around 3 PM.  Patient as per the patient's sister has not been doing well for the last couple of days with difficulty ambulating.  Has been found to have frequent episodes of low blood pressure.  Has not noticed any weakness of the upper or lower extremities.  Since last discharge last month patient has been largely bedbound as per the patient's sister.   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  MR brain notable for acute stroke right corona radiata.  Neurology was consulted and followed during the hospital course.  CT angiogram head/neck without significant carotid or vertebral artery stenosis.  EEG with no epileptiform/seizure-like activity.  Lipid panel with LDL 183, total cholesterol 261, HDL 66, triglycerides 62.  Hemoglobin A1c 5.5. --Plavix 75 mg p.o. daily  for 3 weeks --Aspirin 81 mg p.o. daily --Atorvastatin 80 mg p.o. daily --Dysphagia 1 pured, honey thickened liquid diet; meds crushed with pure  History of orthostatic hypotension --continue home midodrine --Abdominal binder  Chronic diastolic congestive heart failure, compensated TTE 03/17/2020 with LVEF 60-65%, grade 2 diastolic dysfunction, trivial MR, no aortic stenosis. --Not on beta-blocker or ACE inhibitor secondary to significant orthostasis requiring midodrine as above  Anxiety: Alprazolam 0.25 mg p.o. twice daily as needed- defer to PCP To continue  Hydrocephalus, chronic Patient has followed with [facility] neurology in [place] in the past, apparently was instructed that she needs a shunt 2 years ago but patient declines.  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  Patient and sister state would like new referral to neurology in [place]. --Outpatient neurology referral placed     Medical Consultants:   Neuro CIR   Discharge Exam:   Vitals:   05/05/20 0803 05/05/20 1230  BP: (!) 129/58 (!) 143/63  Pulse: 79 68  Resp: 16 16  Temp: 98 F (36.7 C) 97.9 F (36.6 C)  SpO2: 94% 96%   Vitals:   05/05/20 0005 05/05/20 0501 05/05/20 0803 05/05/20 1230  BP: (!) 179/80 (!) 170/78 (!) 129/58 (!) 143/63  Pulse: 72 78 79 68  Resp: Temp: 98.1 F (36.7 C) 97.7 F (36.5 C) 98 F (36.7 C) 97.9 F (36.6 C)  TempSrc: Oral Oral Oral Oral  SpO2: 97% 100% 94% 96%  Weight:         The results of significant diagnostics from  this hospitalization (including imaging, microbiology, ancillary and laboratory) are listed below for reference.     Procedures and Diagnostic Studies:   EEG  Result Date: 04/29/2020 Kristin Quest, MD     04/29/2020  2:10 PM Patient Name: Kristin Rush MRN: [id number] Epilepsy Attending: Charlsie Rush Referring Physician/Provider: Dr. Syliva Rush Date:  04/29/2020 Duration: 24.27 minutes Patient history: 79 year old female with episodes of staring off and inability to move briefly.  EEG to evaluate for seizures. Level of alertness: Awake AEDs during EEG study: None Technical aspects: This EEG study was done with scalp electrodes positioned according to the 10-20 International system of electrode placement. Electrical activity was acquired at a sampling rate of  and reviewed with a high frequency filter of  and a low frequency filter of . EEG data were recorded continuously and digitally stored. Description: No posterior dominant rhythm was seen.  EEG showed continuous generalized polymorphic 5 to 6 Hz theta slowing as well as intermittent generalized 2 to 3 Hz delta slowing.  Hyperventilation and photic stimulation were not performed.   ABNORMALITY - Continuous slow, generalized IMPRESSION: This study is suggestive of moderate diffuse encephalopathy, nonspecific to etiology.  No seizures or epileptiform discharges were seen throughout the recording. Kristin Rush   CT Code Stroke CTA Head W/WO contrast  Result Date: 04/28/2020 CLINICAL DATA:  Focal neuro deficit greater than 6 hours. Isolated facial droop. EXAM: CT ANGIOGRAPHY HEAD AND NECK TECHNIQUE: Multidetector CT imaging of the head and neck was performed using the standard protocol during bolus administration of intravenous contrast. Multiplanar CT image reconstructions and MIPs were obtained to evaluate the vascular anatomy. Carotid stenosis measurements (when applicable) are obtained utilizing NASCET criteria, using the distal internal carotid diameter as the denominator. CONTRAST:  50mL OMNIPAQUE IOHEXOL 350 MG/ML SOLN COMPARISON:  CT head 04/28/2020.  MRI head 04/03/2020 FINDINGS: CTA NECK FINDINGS Aortic arch: Mild atherosclerotic calcification aortic arch. Bovine branching pattern. Proximal great vessels widely patent. Right carotid system: Mild noncalcified plaque in the right common  carotid artery artery. Atherosclerotic calcification in the right carotid bulb with approximately 25% diameter stenosis of the right internal carotid artery. Right external carotid artery widely patent. Left carotid system: Left common carotid artery widely patent. Mild atherosclerotic calcification left carotid bifurcation without significant stenosis. Vertebral arteries: Right vertebral artery dominant and patent to the basilar without stenosis. Small left vertebral artery which is hypoplastic distal to PICA. Small contribution to the basilar. Skeleton: Cervical spondylosis.  No acute skeletal abnormality. Other neck: Negative for mass or adenopathy Upper chest: Apical pleural scarring bilaterally. Lung apices clear bilaterally. Review of the MIP images confirms the above findings CTA HEAD FINDINGS Anterior circulation: Atherosclerotic calcification in the cavernous carotid bilaterally without stenosis. Anterior and middle cerebral arteries patent bilaterally without large vessel occlusion. Mild atherosclerotic irregularity in the M3 branches bilaterally Posterior circulation: Both vertebral arteries contribute to the basilar with right vertebral artery dominant. PICA patent bilaterally. Basilar widely patent. Superior cerebellar and posterior cerebral arteries patent bilaterally. Fetal origin right posterior cerebral artery. Venous sinuses: Minimal venous contrast due to arterial phase scanning Anatomic variants: None Review of the MIP images confirms the above findings IMPRESSION: 1. No significant carotid or vertebral artery stenosis in the neck. Mild atherosclerotic disease. 2. Negative for intracranial large vessel occlusion 3. Mild atherosclerotic irregularity in the M3 branches bilaterally. Electronically Signed   By: Marlan Palau M.D.   On: 04/28/2020 17:14   CT Code Stroke CTA Neck W/WO contrast  Result Date: 04/28/2020 CLINICAL DATA:  Focal neuro deficit greater than 6 hours. Isolated facial droop.  EXAM: CT ANGIOGRAPHY HEAD AND NECK TECHNIQUE: Multidetector CT imaging of the head and neck was performed using the standard protocol during bolus administration of intravenous contrast. Multiplanar CT image reconstructions and MIPs were obtained to evaluate the vascular anatomy. Carotid stenosis measurements (when applicable) are obtained utilizing NASCET criteria, using the distal internal carotid diameter as the denominator. CONTRAST:  35mL OMNIPAQUE IOHEXOL 350 MG/ML SOLN COMPARISON:  CT head 04/28/2020.  MRI head 04/03/2020 FINDINGS: CTA NECK FINDINGS Aortic arch: Mild atherosclerotic calcification aortic arch. Bovine branching pattern. Proximal great vessels widely patent. Right carotid system: Mild noncalcified plaque in the right common carotid artery artery. Atherosclerotic calcification in the right carotid bulb with approximately 25% diameter stenosis of the right internal carotid artery. Right external carotid artery widely patent. Left carotid system: Left common carotid artery widely patent. Mild atherosclerotic calcification left carotid bifurcation without significant stenosis. Vertebral arteries: Right vertebral artery dominant and patent to the basilar without stenosis. Small left vertebral artery which is hypoplastic distal to PICA. Small contribution to the basilar. Skeleton: Cervical spondylosis.  No acute skeletal abnormality. Other neck: Negative for mass or adenopathy Upper chest: Apical pleural scarring bilaterally. Lung apices clear bilaterally. Review of the MIP images confirms the above findings CTA HEAD FINDINGS Anterior circulation: Atherosclerotic calcification in the cavernous carotid bilaterally without stenosis. Anterior and middle cerebral arteries patent bilaterally without large vessel occlusion. Mild atherosclerotic irregularity in the M3 branches bilaterally Posterior circulation: Both vertebral arteries contribute to the basilar with right vertebral artery dominant. PICA  patent bilaterally. Basilar widely patent. Superior cerebellar and posterior cerebral arteries patent bilaterally. Fetal origin right posterior cerebral artery. Venous sinuses: Minimal venous contrast due to arterial phase scanning Anatomic variants: None Review of the MIP images confirms the above findings IMPRESSION: 1. No significant carotid or vertebral artery stenosis in the neck. Mild atherosclerotic disease. 2. Negative for intracranial large vessel occlusion 3. Mild atherosclerotic irregularity in the M3 branches bilaterally. Electronically Signed   By: Marlan Palau M.D.   On: 04/28/2020 17:14   MR BRAIN WO CONTRAST  Result Date: 04/28/2020 CLINICAL DATA:  Acute neuro deficit. EXAM: MRI HEAD WITHOUT CONTRAST TECHNIQUE: Multiplanar, multiecho pulse sequences of the brain and surrounding structures were obtained without intravenous contrast. COMPARISON:  MRI head 04/03/2020.  CT angio head and neck 04/28/2020 FINDINGS: Brain: Acute infarct in the right Corona radiata extending into the deep white matter tracks. No other area of acute infarct. Mild chronic microvascular ischemic change in the white matter Mild to moderate ventricular enlargement is unchanged. Ventricular enlargement is greater than the level of atrophy suggesting hydrocephalus. Third fourth and lateral ventricles all dilated. Negative for intracranial hemorrhage or mass lesion. Vascular: Normal arterial flow voids. Skull and upper cervical spine: No focal skeletal lesion. Sinuses/Orbits: Paranasal sinuses clear.  Negative orbit. Other: Multilocular cyst anterior to the C2 vertebral body on the left measuring approximately 24 x 8 mm. This appears to be within the longus coli muscle on the left. No change from the recent MRI. IMPRESSION: Acute infarct in the right corona radiata and deep white matter tracks. Mild to moderate ventricular enlargement compatible with hydrocephalus. 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 Ventricle size stable  since the recent MRI of 04/03/2020. 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 Uncertain etiology. Possible chronic tendonitis. Electronically Signed   By: Marlan Palau M.D.   On: 04/28/2020 18:39  DG Swallowing Func-Speech Pathology  Result Date: 04/29/2020 Objective Swallowing Evaluation: Type of Study: Bedside Swallow Evaluation  Patient Details Name: Miguel RotaSue Hudson Pattison MRN: [id number] Date of Birth: 10-05-1940 Today's Date: 04/29/2020 Time: SLP Start Time (ACUTE ONLY): 1000 -SLP Stop Time (ACUTE ONLY): 1025 SLP Time Calculation (min) (ACUTE ONLY): 25 min Past Medical History: Past Medical History: Diagnosis Date . 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 Lumbar spondylosis 02/18/2018 . Migraine  . Migraines 10/09/2011 . Orthostatic hypotension 10/23/2016 . Osteoporosis 04/16/2014 . Primary osteoarthritis of both hands 07/11/2017 Past Surgical History: No past surgical history on file. HPI: 79 y.o. female with history of recently admission for recurrent syncope with orthostatic hypotension, CHF was brought to the ER after patient was found to have a left facial droop noticed around 3 PM.  Patient as per the patient's sister has not been doing well for the last couple of days with difficulty ambulating.  Has been found to have frequent episodes of low blood pressure. MRI of head revealed Acute infarct in the right corona radiata and deep white matter  Subjective: upright in chair for procedure Assessment / Plan / Recommendation CHL IP CLINICAL IMPRESSIONS 04/29/2020 Clinical Impression Novel moderate oropharyngeal dysphagia  exhibited with sensory and motor impairments s/p right corona radiata territory CVA. Left sided oral motor deficits included decreased lingual, labial, facial strength allowing for intermittent left sided anterior spillage, delayed AP transit, oral residuals, prolonged mastication of solid POs, and stasis/lack of  propulsion of barium tablet; this was subsequently extracted. Pharyngeal deficits marked by reduced timing and effiiency of laryngeal vestibule closure, incomplete epiglottic deflection, weakned pharyngeal stripping wave, reduced base of tongue retraction, decreased laryngeal elevation, and diminished sensation. Pt with frequent pre and during the swallow penetration of thin, nectar thick liquids and episodic aspiration of thin and nectar thick liquids (inconsisently sensed). Trace penetration and trace aspiration (clearing post swallow) exhibited with honey thick liquids. Mild to moderate pharyngeal residuals noted with puree, solid PO. Recommend conservative dysphagia 1 (puree) puree, honey thick liquids with medicines crushed and safe swallowing precauitons including full supervsion. Pt with waxing/waning mentation. SLP to follow up.  SLP Visit Diagnosis Dysphagia, oropharyngeal phase (R13.12) Attention and concentration deficit following -- Frontal lobe and executive function deficit following -- Impact on safety and function Moderate aspiration risk;Severe aspiration risk   CHL IP TREATMENT RECOMMENDATION 04/29/2020 Treatment Recommendations Therapy as outlined in treatment plan below   Prognosis 04/29/2020 Prognosis for Safe Diet Advancement Good Barriers to Reach Goals Time post onset;Severity of deficits Barriers/Prognosis Comment -- CHL IP DIET RECOMMENDATION 04/29/2020 SLP Diet Recommendations Dysphagia 1 (Puree) solids;Honey thick liquids Liquid Administration via Cup Medication Administration Crushed with puree Compensations Slow rate;Small sips/bites;Effortful swallow;Multiple dry swallows after each bite/sip;Lingual sweep for clearance of pocketing;Clear throat after each swallow Postural Changes Seated upright at 90 degrees;Remain semi-upright after after feeds/meals (Comment)   CHL IP OTHER RECOMMENDATIONS 04/29/2020 Recommended Consults -- Oral Care Recommendations Oral care BID Other Recommendations  Order thickener from pharmacy   CHL IP FOLLOW UP RECOMMENDATIONS 04/29/2020 Follow up Recommendations Other (comment)   CHL IP FREQUENCY AND DURATION 04/29/2020 Speech Therapy Frequency (ACUTE ONLY) min 2x/week Treatment Duration 1 week      CHL IP ORAL PHASE 04/29/2020 Oral Phase Impaired Oral - Pudding Teaspoon -- Oral - Pudding Cup -- Oral - Honey Teaspoon -- Oral - Honey Cup Weak lingual manipulation;Left anterior bolus loss;Incomplete tongue to palate contact;Reduced posterior propulsion;Holding of bolus;Lingual/palatal residue;Delayed oral transit Oral -  Nectar Teaspoon -- Oral - Nectar Cup Left anterior bolus loss;Weak lingual manipulation;Incomplete tongue to palate contact;Left pocketing in lateral sulci;Lingual/palatal residue;Delayed oral transit;Holding of bolus;Reduced posterior propulsion Oral - Nectar Straw -- Oral - Thin Teaspoon -- Oral - Thin Cup Delayed oral transit;Weak lingual manipulation;Lingual/palatal residue Oral - Thin Straw Weak lingual manipulation;Lingual/palatal residue Oral - Puree Delayed oral transit;Weak lingual manipulation;Reduced posterior propulsion;Lingual/palatal residue Oral - Mech Soft Delayed oral transit;Piecemeal swallowing;Lingual/palatal residue;Reduced posterior propulsion;Left pocketing in lateral sulci;Impaired mastication;Weak lingual manipulation;Decreased bolus cohesion Oral - Regular -- Oral - Multi-Consistency -- Oral - Pill Other (Comment);Holding of bolus Oral Phase - Comment --  CHL IP PHARYNGEAL PHASE 04/29/2020 Pharyngeal Phase Impaired Pharyngeal- Pudding Teaspoon -- Pharyngeal -- Pharyngeal- Pudding Cup -- Pharyngeal -- Pharyngeal- Honey Teaspoon Pharyngeal residue - valleculae;Pharyngeal residue - pyriform;Penetration/Aspiration before swallow;Penetration/Aspiration during swallow;Reduced laryngeal elevation;Reduced epiglottic inversion;Reduced pharyngeal peristalsis Pharyngeal Material enters airway, remains ABOVE vocal cords then ejected out;Material does  not enter airway;Material enters airway, passes BELOW cords then ejected out Pharyngeal- Honey Cup -- Pharyngeal -- Pharyngeal- Nectar Teaspoon -- Pharyngeal -- Pharyngeal- Nectar Cup Reduced tongue base retraction;Reduced airway/laryngeal closure;Reduced epiglottic inversion;Penetration/Aspiration during swallow;Penetration/Aspiration before swallow;Pharyngeal residue - valleculae;Pharyngeal residue - pyriform;Delayed swallow initiation-pyriform sinuses;Reduced pharyngeal peristalsis;Reduced laryngeal elevation Pharyngeal Material enters airway, remains ABOVE vocal cords then ejected out;Material enters airway, CONTACTS cords and not ejected out;Material enters airway, passes BELOW cords then ejected out Pharyngeal- Nectar Straw -- Pharyngeal -- Pharyngeal- Thin Teaspoon -- Pharyngeal -- Pharyngeal- Thin Cup Penetration/Aspiration during swallow;Reduced airway/laryngeal closure;Reduced pharyngeal peristalsis;Reduced epiglottic inversion;Reduced laryngeal elevation;Reduced tongue base retraction;Delayed swallow initiation-pyriform sinuses;Pharyngeal residue - valleculae;Pharyngeal residue - pyriform;Penetration/Aspiration before swallow Pharyngeal Material enters airway, remains ABOVE vocal cords then ejected out;Material enters airway, CONTACTS cords and not ejected out Pharyngeal- Thin Straw Reduced tongue base retraction;Penetration/Aspiration before swallow;Reduced epiglottic inversion;Delayed swallow initiation-pyriform sinuses;Reduced airway/laryngeal closure;Reduced pharyngeal peristalsis;Pharyngeal residue - valleculae;Pharyngeal residue - pyriform Pharyngeal Material enters airway, passes BELOW cords without attempt by patient to eject out (silent aspiration) Pharyngeal- Puree Delayed swallow initiation-vallecula;Reduced laryngeal elevation;Reduced tongue base retraction;Reduced epiglottic inversion;Reduced pharyngeal peristalsis;Pharyngeal residue - pyriform;Pharyngeal residue - valleculae Pharyngeal --  Pharyngeal- Mechanical Soft Delayed swallow initiation-vallecula;Reduced laryngeal elevation;Reduced pharyngeal peristalsis;Reduced epiglottic inversion;Reduced tongue base retraction;Pharyngeal residue - valleculae;Pharyngeal residue - pyriform Pharyngeal -- Pharyngeal- Regular -- Pharyngeal -- Pharyngeal- Multi-consistency -- Pharyngeal -- Pharyngeal- Pill Other (Comment) Pharyngeal -- Pharyngeal Comment --  No flowsheet data found. Chelsea E Hartness MA, CCC-SLP 04/29/2020, 11:46 AM              CT HEAD CODE STROKE WO CONTRAST  Result Date: 04/28/2020 CLINICAL DATA:  Code stroke.  Facial droop. EXAM: CT HEAD WITHOUT CONTRAST TECHNIQUE: Contiguous axial images were obtained from the base of the skull through the vertex without intravenous contrast. COMPARISON:  Head MRI 04/03/2020 FINDINGS: Brain: There is no evidence of an acute infarct, intracranial hemorrhage, mass, midline shift, or extra-axial fluid collection. Hypodensities in the cerebral white matter bilaterally are nonspecific but compatible with mild chronic small vessel ischemic disease. A chronic lacunar infarct is again noted in the right thalamus. Mild ventriculomegaly is unchanged and out of proportion to the size of the sulci and may reflect central predominant cerebral atrophy or normal pressure hydrocephalus. Vascular: Calcified atherosclerosis at the skull base. No hyperdense vessel. Skull: No fracture or suspicious osseous lesion. Sinuses/Orbits: Visualized paranasal sinuses and mastoid air cells are clear. Orbits are unremarkable. Other: None. ASPECTS [name] Stroke Program Early CT Score) - Ganglionic level infarction (caudate, lentiform nuclei, internal capsule, insula, M1-M3 cortex): 7 -  Supraganglionic infarction (M4-M6 cortex): 3 Total score (0-10 with 10 being normal): 10 IMPRESSION: 1. No evidence of acute intracranial abnormality. 2. ASPECTS is 10. 3. Mild chronic small vessel ischemic disease. These results were communicated to  Dr. Amada Jupiter at 4:57 pm on 04/28/2020 by text page via the [facility] messaging system. Electronically Signed   By: Sebastian Ache M.D.   On: 04/28/2020 16:58     Labs:   Basic Metabolic Panel: Recent Labs  Lab 04/28/20 1649 04/28/20 1649 04/28/20 1651 05/01/20 0328 05/05/20 0904  NA 137  --  136 140  --   K 4.3   < > 4.4 4.4  --   CL 97*  --  100 106  --   CO2  --   --  28 26  --   GLUCOSE 87  --  91 93  --   BUN 13  --  12 13  --   CREATININE 1.00  --  0.92 0.91 0.90  CALCIUM  --   --  9.3 8.8*  --    < > = values in this interval not displayed.   GFR Estimated Creatinine Clearance: 43.3 mL/min (by C-G formula based on SCr of 0.9 mg/dL). Liver Function Tests: Recent Labs  Lab 04/28/20 1651  AST 27  ALT 23  ALKPHOS 88  BILITOT 0.7  PROT 6.2*  ALBUMIN 3.6   No results for input(s): LIPASE, AMYLASE in the last 168 hours. No results for input(s): AMMONIA in the last 168 hours. Coagulation profile Recent Labs  Lab 04/28/20 1651  INR 0.9    CBC: Recent Labs  Lab 04/28/20 1649 04/28/20 1651 05/01/20 0328  WBC  --  4.3 4.1  NEUTROABS  --  2.4  --   HGB 12.6 12.3 11.5*  HCT 37.0 37.5 35.2*  MCV  --  94.2 93.6  PLT  --  158 135*   Cardiac Enzymes: No results for input(s): CKTOTAL, CKMB, CKMBINDEX, TROPONINI in the last 168 hours. BNP: Invalid input(s): POCBNP CBG: Recent Labs  Lab 04/28/20 1641 05/03/20 0820  GLUCAP 83 157*   D-Dimer No results for input(s): DDIMER in the last 72 hours. Hgb A1c No results for input(s): HGBA1C in the last 72 hours. Lipid Profile No results for input(s): CHOL, HDL, LDLCALC, TRIG, CHOLHDL, LDLDIRECT in the last 72 hours. Thyroid function studies No results for input(s): TSH, T4TOTAL, T3FREE, THYROIDAB in the last 72 hours.  Invalid input(s): FREET3 Anemia work up No results for input(s): VITAMINB12, FOLATE, FERRITIN, TIBC, IRON, RETICCTPCT in the last 72 hours. Microbiology Recent Results (from the past 240 hour(s))    SARS Coronavirus 2 by RT PCR (hospital order, performed in [facility] hospital lab) Nasopharyngeal Nasopharyngeal Swab     Status: None   Collection Time: 04/28/20  8:12 PM   Specimen: Nasopharyngeal Swab  Result Value Ref Range Status   SARS Coronavirus 2 NEGATIVE NEGATIVE Final    Comment: (NOTE) SARS-CoV-2 target nucleic acids are NOT DETECTED.  The SARS-CoV-2 RNA is generally detectable in upper and lower respiratory specimens during the acute phase of infection. The lowest concentration of SARS-CoV-2 viral copies this assay can detect is 250 copies / mL. A negative result does not preclude SARS-CoV-2 infection and should not be used as the sole basis for treatment or other patient management decisions.  A negative result may occur with improper specimen collection / handling, submission of specimen other than nasopharyngeal swab, presence of viral mutation(s) within the areas targeted by this  assay, and inadequate number of viral copies (<250 copies / mL). A negative result must be combined with clinical observations, patient history, and epidemiological information.  Fact Sheet for Patients:   BoilerBrush.com.cy  Fact Sheet for Healthcare Providers: https://pope.com/  This test is not yet approved or  cleared by the [place] FDA and has been authorized for detection and/or diagnosis of SARS-CoV-2 by FDA under an Emergency Use Authorization (EUA).  This EUA will remain in effect (meaning this test can be used) for the duration of the COVID-19 declaration under Section 564(b)(1) of the Act, 21 U.S.C. section 360bbb-3(b)(1), unless the authorization is terminated or revoked sooner.  Performed at [facility], 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   Discharge instructions   Complete by: As directed    Dysphagia 1 pured, honey thickened liquid diet; meds crushed with pure Asa plus plavix for 14 more day then ASA alone   Increase activity slowly   Complete by: As directed      Allergies as of 05/05/2020      Reactions   Codeine Nausea And Vomiting   Sulfa Antibiotics       Medication List    STOP taking these medications   ibuprofen 200 MG tablet Commonly known as: ADVIL   ondansetron 4 MG tablet Commonly known as: ZOFRAN   polyethylene glycol 17 g packet Commonly known as: MIRALAX / GLYCOLAX     TAKE these medications   acetaminophen 500 MG tablet Commonly known as: TYLENOL Take 500 mg by mouth daily as needed for mild pain.   aspirin 81 MG EC tablet Take 1 tablet (81 mg total) by mouth daily. Swallow whole. Start taking on: May 06, 2020   atorvastatin 80 MG tablet Commonly known as: LIPITOR Take 1 tablet (80 mg total) by mouth daily at 6 PM.   calcium-vitamin D 500-200 MG-UNIT tablet Commonly known as: OSCAL WITH D Take 1 tablet by mouth daily.   clopidogrel 75 MG tablet Commonly known as: PLAVIX Take 1 tablet (75 mg total) by mouth daily. Start taking on: May 06, 2020   cyanocobalamin 1000 MCG tablet Take 1,000 mcg by mouth daily.   escitalopram 10 MG tablet Commonly known as: LEXAPRO Take 10 mg by mouth daily.   midodrine 5 MG tablet Commonly known as: PROAMATINE Take 1 tablet (5 mg total) by mouth 2 (two) times daily with a meal.   midodrine 2.5 MG tablet Commonly known as: PROAMATINE Take 1 tablet (2.5 mg total) by mouth at bedtime.   PreserVision AREDS Tabs Take 1 tablet by mouth daily.   Resource ThickenUp Clear Powd To make liquids honey thick       Follow-up Information    Kristin Inch, MD Follow up in 1 week(s).   Specialty: Family Medicine Contact  information: [address] [place] [address] [phone]                Time coordinating discharge: 35 min  Signed:  Joseph Art DO  [facility] 05/05/2020, 2:12 PM

## 2020-05-05 NOTE — Progress Notes (Signed)
Inpatient Rehab Admissions Coordinator:   Spoke with PT, who has updated recs to home health.  Pt mobilizing up to min guard with 120', which is close to her baseline of supervision.  Will sign off at this time. I let Dr. Benjamine Mola know.   Estill Dooms, PT, DPT Admissions Coordinator [phone] 05/05/20  1:58 PM

## 2020-05-05 NOTE — Progress Notes (Signed)
  Speech Language Pathology Treatment: Cognitive-Linquistic  Patient Details Name: Kristin Rush MRN: [id number] DOB: May 09, 1941 Today's Date: 05/05/2020 Time: 1040-1103 SLP Time Calculation (min) (ACUTE ONLY): 23 min  Assessment / Plan / Recommendation Clinical Impression  Pt was seen for cognitive-linguistic treatment with her sister present. Pt reported that she has been doing well and denied any cognitive-linguistic changes since the CVA. Speech intelligibility was notably improved compared to the initial evaluation; articulatory precision was mildly reduced, but pt and family indicated that this is her baseline. She was oriented x4 with cues for the date. She achieved 33% accuracy with a mental manipulation task increasing to 100% with repetition and cues. She required intermittent cueing for provision of steps and details. She completed a 3-item recall task with 75% accuracy increasing to 100% with cues. She recalled 4 related items with 25% accuracy increasing to 75% with semantic cues. SLP will continue to follow pt.    HPI HPI: 79 y.o. female with history of recently admission for recurrent syncope with orthostatic hypotension, CHF was brought to the ER after patient was found to have a left facial droop noticed around 3 PM.  Patient as per the patient's sister has not been doing well for the last couple of days with difficulty ambulating.  Has been found to have frequent episodes of low blood pressure. MRI of head revealed Acute infarct in the right corona radiata and deep white matter      SLP Plan  Continue with current plan of care       Recommendations  Diet recommendations: Dysphagia 1 (puree);Honey-thick liquid Liquids provided via: Cup;Straw Medication Administration: Crushed with puree Supervision: Staff to assist with self feeding Compensations: Slow rate;Small sips/bites;Effortful swallow;Multiple dry swallows after each bite/sip;Lingual sweep for clearance of  pocketing;Clear throat after each swallow Postural Changes and/or Swallow Maneuvers: Seated upright 90 degrees                Oral Care Recommendations: Oral care BID Follow up Recommendations: Inpatient Rehab SLP Visit Diagnosis: Cognitive communication deficit (R41.841);Dysphagia, oropharyngeal phase (R13.12) Plan: Continue with current plan of care       Robb Sibal I. Vear Clock, MS, CCC-SLP Acute Rehabilitation Services Office number [phone] Pager [phone]                Scheryl Marten 05/05/2020, 11:10 AM

## 2020-05-05 NOTE — Progress Notes (Signed)
PROGRESS NOTE    Kristin Rush  XBM:[id number] DOB: March 09, 1941 DOA: 04/28/2020 PCP: Eartha Inch, MD    Brief Narrative:  Kristin Rush is a 79 year old female with recent admission for recurrent syncope with orthostatic hypotension, very mild cognitive difficulties, chronic diastolic CHF, who lives with her sister who is being brought to the hospital after sister mentioned that she has had left facial droop yesterday afternoon.  Patient sister also tells me that she has had difficulties moving her legs and reports that almost on a daily basis the patient has some spells in which she stares in space and is unable to move for short period of time.  Since discharge last month patient has been largely bedbound.  In the ED an MRI of the brain confirmed acute stroke involving the right corona radiata, also showed hydrocephalus.  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  MR brain notable for acute stroke right corona radiata.  Neurology was consulted and followed during the hospital course.  CT angiogram head/neck without significant carotid or vertebral artery stenosis.  EEG with no epileptiform/seizure-like activity.  Lipid panel with LDL 183, total cholesterol 261, HDL 66, triglycerides 62.  Hemoglobin A1c 5.5. --Plavix 75 mg p.o. daily for 3 weeks --Aspirin 81 mg p.o. daily --Atorvastatin 80 mg p.o. daily --Dysphagia 1 pured, honey thickened liquid diet; meds crushed with pure  History of orthostatic hypotension --Increased home midodrine to 10 mg p.o. 3 times daily --Abdominal binder  Chronic diastolic congestive heart failure,  compensated TTE 03/17/2020 with LVEF 60-65%, grade 2 diastolic dysfunction, trivial MR, no aortic stenosis. --Not on beta-blocker or ACE inhibitor secondary to significant orthostasis requiring midodrine as above  Anxiety: Alprazolam 0.25 mg p.o. twice daily as needed  Hydrocephalus, chronic Patient has followed with [facility] neurology in [place] in the past, apparently was instructed that she needs a shunt 2 years ago but patient declines.  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  Patient and sister state would like new referral to neurology in [place]. --Outpatient neurology referral placed   DVT prophylaxis: Lovenox Code Status: DNR Family Communication: family at bedside  Disposition Plan:  Status is: Inpatient  Remains inpatient appropriate because:Unsafe d/c plan   Dispo: The patient is from: Home              Anticipated d/c is to: CIR              Anticipated d/c date is: when insurance authorization obtained              Patient currently is medically stable to d/c.   Consultants:   Neurology - signed off 04/30/2020  Procedures:   TTE    Subjective: No overnight events- no complaints  Objective: Vitals:   05/04/20 2015 05/05/20 0005 05/05/20 0501 05/05/20 0803  BP: (!) 181/82 (!) 179/80 (!) 170/78 (!) 129/58  Pulse: 78 72 78 79  Resp: 18 18 18 16   Temp: 98.4 F (36.9 C) 98.1 F (36.7 C) 97.7 F (36.5 C) 98 F (36.7 C)  TempSrc: Oral Oral Oral Oral  SpO2: 97% 97% 100% 94%  Weight:  No intake or output data in the 24 hours ending 05/05/20 1123 Filed Weights   04/28/20 1600 04/30/20 0018  Weight: 59.1 kg 58.8 kg    Examination:   General: Appearance:    Well developed, well nourished female in no acute distress     Lungs:     Clear to auscultation bilaterally, respirations unlabored  Heart:    Normal heart rate. Normal rhythm. No murmurs, rubs, or gallops.   MS:   All  extremities are intact.   Neurologic:   Awake, alert- dysarthric      Data Reviewed: I have personally reviewed following labs and imaging studies  CBC: Recent Labs  Lab 04/28/20 1649 04/28/20 1651 05/01/20 0328  WBC  --  4.3 4.1  NEUTROABS  --  2.4  --   HGB 12.6 12.3 11.5*  HCT 37.0 37.5 35.2*  MCV  --  94.2 93.6  PLT  --  158 135*   Basic Metabolic Panel: Recent Labs  Lab 04/28/20 1649 04/28/20 1651 05/01/20 0328 05/05/20 0904  NA 137 136 140  --   K 4.3 4.4 4.4  --   CL 97* 100 106  --   CO2  --  28 26  --   GLUCOSE 87 91 93  --   BUN 13 12 13   --   CREATININE 1.00 0.92 0.91 0.90  CALCIUM  --  9.3 8.8*  --    GFR: Estimated Creatinine Clearance: 43.3 mL/min (by C-G formula based on SCr of 0.9 mg/dL). Liver Function Tests: Recent Labs  Lab 04/28/20 1651  AST 27  ALT 23  ALKPHOS 88  BILITOT 0.7  PROT 6.2*  ALBUMIN 3.6   No results for input(s): LIPASE, AMYLASE in the last 168 hours. No results for input(s): AMMONIA in the last 168 hours. Coagulation Profile: Recent Labs  Lab 04/28/20 1651  INR 0.9   Cardiac Enzymes: No results for input(s): CKTOTAL, CKMB, CKMBINDEX, TROPONINI in the last 168 hours. BNP (last 3 results) No results for input(s): PROBNP in the last 8760 hours. HbA1C: No results for input(s): HGBA1C in the last 72 hours. CBG: Recent Labs  Lab 04/28/20 1641 05/03/20 0820  GLUCAP 83 157*   Lipid Profile: No results for input(s): CHOL, HDL, LDLCALC, TRIG, CHOLHDL, LDLDIRECT in the last 72 hours. Thyroid Function Tests: No results for input(s): TSH, T4TOTAL, FREET4, T3FREE, THYROIDAB in the last 72 hours. Anemia Panel: No results for input(s): VITAMINB12, FOLATE, FERRITIN, TIBC, IRON, RETICCTPCT in the last 72 hours. Sepsis Labs: No results for input(s): PROCALCITON, LATICACIDVEN in the last 168 hours.  Recent Results (from the past 240 hour(s))  SARS Coronavirus 2 by RT PCR (hospital order, performed in [facility] hospital  lab) Nasopharyngeal Nasopharyngeal Swab     Status: None   Collection Time: 04/28/20  8:12 PM   Specimen: Nasopharyngeal Swab  Result Value Ref Range Status   SARS Coronavirus 2 NEGATIVE NEGATIVE Final    Comment: (NOTE) SARS-CoV-2 target nucleic acids are NOT DETECTED.  The SARS-CoV-2 RNA is generally detectable in upper and lower respiratory specimens during the acute phase of infection. The lowest concentration of SARS-CoV-2 viral copies this assay can detect is 250 copies / mL. A negative result does not preclude SARS-CoV-2 infection and should not be used as the sole basis for treatment or other patient management decisions.  A negative result may occur with improper specimen collection / handling, submission of specimen other than nasopharyngeal swab, presence of viral mutation(s) within the  areas targeted by this assay, and inadequate number of viral copies (<250 copies / mL). A negative result must be combined with clinical observations, patient history, and epidemiological information.  Fact Sheet for Patients:   BoilerBrush.com.cy  Fact Sheet for Healthcare Providers: https://pope.com/  This test is not yet approved or  cleared by the [place] FDA and has been authorized for detection and/or diagnosis of SARS-CoV-2 by FDA under an Emergency Use Authorization (EUA).  This EUA will remain in effect (meaning this test can be used) for the duration of the COVID-19 declaration under Section 564(b)(1) of the Act, 21 U.S.C. section 360bbb-3(b)(1), unless the authorization is terminated or revoked sooner.  Performed at [facility], [address], [place], [place] [address]          Radiology Studies: No results found.      Scheduled Meds:   stroke: mapping our early stages of recovery book   Does not apply Once   aspirin EC  81 mg Oral Daily   atorvastatin  80 mg Oral q1800   clopidogrel  75 mg  Oral Daily   diclofenac Sodium  2 g Topical QID   enoxaparin (LOVENOX) injection  40 mg Subcutaneous Daily   midodrine  10 mg Oral TID WC   Continuous Infusions:   LOS: 6 days    Time spent: 25 minutes spent on chart review, discussion with nursing staff, consultants, updating family and interview/physical exam; more than 50% of that time was spent in counseling and/or coordination of care.    Joseph Art, DO [facility] Available via Epic secure chat 7am-7pm After these hours, please refer to coverage provider listed on [email or web address] 05/05/2020, 11:23 AM

## 2020-05-05 NOTE — Progress Notes (Signed)
Physical Therapy Treatment Patient Details Name: Kristin Rush MRN: [id number] DOB: [birth year] Today's Date: 05/05/2020    History of Present Illness Angelli Baruch is a 79 y.o. female who was noted to develop facial droop sometime after 3 PM.  She has a history of stroke with no residual deficits as well as memory loss.recently admission for recurrent syncope with orthostatic hypotension, CHF.Since last discharge last month patient has been largely bedbound as per the patient's sister. 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    PT Comments    Patient received in bed, lying on side, family present in room. She is agreeable to PT. Reports no pain or problems this day.  Patient performed bed mobility with mod independence. She performed sit to stand with min guard. Demonstrates good static standing balance. She is able to ambulate 120 feet with RW and min guard. Occasional cues for direction needed. She will continue to benefit from skilled PT while here to improve strength and safety with functional mobility.     Follow Up Recommendations  Home health PT;Supervision for mobility/OOB     Equipment Recommendations  Rolling walker with 5" wheels;3in1 (PT)    Recommendations for Other Services       Precautions / Restrictions Precautions Precautions: Fall Precaution Comments: L inattention Restrictions Weight Bearing Restrictions: No    Mobility  Bed Mobility Overal bed mobility: Modified Independent Bed Mobility: Supine to Sit;Sit to Supine     Supine to sit: Modified independent (Device/Increase time) Sit to supine: Modified independent (Device/Increase time)   General bed mobility comments: no physical assist needed, increased time and cues needed.  Transfers Overall transfer level: Needs assistance Equipment  used: Rolling walker (2 wheeled) Transfers: Sit to/from Stand Sit to Stand: Min guard            Ambulation/Gait Ambulation/Gait assistance: Min guard Gait Distance (Feet): 120 Feet Assistive device: Rolling walker (2 wheeled) Gait Pattern/deviations: Step-through pattern;Decreased stride length;Shuffle Gait velocity: decreased   General Gait Details: Patient with improved balance and midline this visit. No reports of dizziness with activity. Cues needed for direction at times.   Stairs             Wheelchair Mobility    Modified Rankin (Stroke Patients Only) Modified Rankin (Stroke Patients Only) Pre-Morbid Rankin Score: Moderate disability Modified Rankin: Moderate disability     Balance Overall balance assessment: Needs assistance Sitting-balance support: Feet supported Sitting balance-Leahy Scale: Good     Standing balance support: Bilateral upper extremity supported;During functional activity Standing balance-Leahy Scale: Fair Standing balance comment: BUE support needed on RW, min guard                            Cognition Arousal/Alertness: Awake/alert Behavior During Therapy: WFL for tasks assessed/performed Overall Cognitive Status: History of cognitive impairments - at baseline Area of Impairment: Following commands;Problem solving;Memory;Safety/judgement                 Orientation Level: Disoriented to;Place;Time;Situation Current Attention Level: Sustained Memory: Decreased short-term memory Following Commands: Follows one step commands consistently;Follows one step commands with increased time Safety/Judgement: Decreased awareness of safety;Decreased awareness of deficits   Problem Solving: Decreased initiation;Difficulty sequencing;Requires verbal cues;Slow processing General Comments: Pt with slowed response to all commands, required repeated cues to complete action or perform correct sequencing.  Exercises Other  Exercises Other Exercises: B LE exercises: LAQ, marching, SLR, hip abd/add x 10 reps each    General Comments        Pertinent Vitals/Pain Pain Assessment: No/denies pain Faces Pain Scale: No hurt    Home Living                      Prior Function            PT Goals (current goals can now be found in the care plan section) Acute Rehab PT Goals Patient Stated Goal: none stated Time For Goal Achievement: 05/14/20 Progress towards PT goals: Progressing toward goals    Frequency    Min 4X/week      PT Plan Discharge plan needs to be updated    Co-evaluation              AM-PAC PT "6 Clicks" Mobility   Outcome Measure  Help needed turning from your back to your side while in a flat bed without using bedrails?: None Help needed moving from lying on your back to sitting on the side of a flat bed without using bedrails?: None Help needed moving to and from a bed to a chair (including a wheelchair)?: A Little Help needed standing up from a chair using your arms (e.g., wheelchair or bedside chair)?: A Little Help needed to walk in hospital room?: A Little Help needed climbing 3-5 steps with a railing? : A Little 6 Click Score: 20    End of Session Equipment Utilized During Treatment: Gait belt Activity Tolerance: Patient tolerated treatment well Patient left: with call bell/phone within reach;with family/visitor present;in bed;with bed alarm set Nurse Communication: Mobility status PT Visit Diagnosis: Repeated falls (R29.6);Muscle weakness (generalized) (M62.81);Difficulty in walking, not elsewhere classified (R26.2);Hemiplegia and hemiparesis Hemiplegia - Right/Left: Left Hemiplegia - dominant/non-dominant: Non-dominant Hemiplegia - caused by: Cerebral infarction     Time: 9371-6967 PT Time Calculation (min) (ACUTE ONLY): 15 min  Charges:  $Gait Training: 8-22 mins                     Keionte Swicegood, PT, GCS 05/05/20,1:35 PM

## 2020-05-05 NOTE — TOC Initial Note (Addendum)
Transition of Care [facility]) - Initial/Assessment Note    Patient Details  Name: Kristin Rush MRN: [id number] Date of Birth: 27-Jan-1941  Transition of Care [facility]) CM/SW Contact:    Kingsley Plan, RN Phone Number: 05/05/2020, 2:35 PM  Clinical Narrative:                 Spoke to patient and sister [name]. Confirmed face sheet information. Patient lives with Elita Quick who cares for patient. Patient has walker and 3 in1 at home already.   They would like to use [facility]. Called Cassie with [facility] awaiting call back.   Cassie with [facility] returned call. She will need orders for PT,OT,SW,SP. Same entered . [name] and patient aware.  Expected Discharge Plan: Home w Home Health Services     Patient Goals and CMS Choice Patient states their goals for this hospitalization and ongoing recovery are:: to go home   Choice offered to / list presented to : Patient, Sibling, Spouse  Expected Discharge Plan and Services Expected Discharge Plan: Home w Home Health Services   Discharge Planning Services: CM Consult Post Acute Care Choice: Home Health Living arrangements for the past 2 months: Single Family Home Expected Discharge Date: 05/05/20               DME Arranged: N/A DME Agency: NA       HH Arranged: Refused SNF, PT HH Agency: [facility] Date HH Agency Contacted: 05/05/20 Time HH Agency Contacted: 1430 Representative spoke with at [facility] Agency: left Cassie a message awaiting call back  Prior Living Arrangements/Services Living arrangements for the past 2 months: Single Family Home   Patient language and need for interpreter reviewed:: Yes Do you feel safe going back to the place where you live?: Yes      Need for Family Participation in Patient Care: Yes (Comment) Care giver support system in place?: Yes (comment) Current home services: DME Criminal Activity/Legal Involvement Pertinent to Current Situation/Hospitalization: No - Comment as  needed  Activities of Daily Living Home Assistive Devices/Equipment: Walker (specify type) ADL Screening (condition at time of admission) Patient's cognitive ability adequate to safely complete daily activities?: No Is the patient deaf or have difficulty hearing?: No Does the patient have difficulty seeing, even when wearing glasses/contacts?: No Does the patient have difficulty concentrating, remembering, or making decisions?: Yes Patient able to express need for assistance with ADLs?: No Does the patient have difficulty dressing or bathing?: Yes Independently performs ADLs?: No Communication: Needs assistance Is this a change from baseline?: Pre-admission baseline Dressing (OT): Needs assistance Is this a change from baseline?: Pre-admission baseline Grooming: Needs assistance Is this a change from baseline?: Pre-admission baseline Feeding: Dependent Is this a change from baseline?: Pre-admission baseline Bathing: Needs assistance Is this a change from baseline?: Pre-admission baseline Toileting: Needs assistance Is this a change from baseline?: Pre-admission baseline In/Out Bed: Needs assistance Is this a change from baseline?: Pre-admission baseline Walks in Home: Needs assistance Is this a change from baseline?: Pre-admission baseline Does the patient have difficulty walking or climbing stairs?: Yes Weakness of Legs: Both Weakness of Arms/Hands: None  Permission Sought/Granted   Permission granted to share information with : Yes, Verbal Permission Granted  Share Information with NAME: sister [name]  Permission granted to share info w AGENCY: [facility]        Emotional Assessment              Admission diagnosis:  CVA (cerebral vascular accident) (HCC) [I63.9]  Acute CVA (cerebrovascular accident) [facility]) [I63.9] Cerebrovascular accident (CVA), unspecified mechanism (HCC) [I63.9] Facial droop due to acute stroke (HCC) [I63.9, R29.810] Patient Active Problem List    Diagnosis Date Noted  . CVA (cerebral vascular accident) (HCC) 04/29/2020  . Acute CVA (cerebrovascular accident) (HCC) 04/28/2020  . 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 Frequent falls 03/17/2020  . Postural dizziness with presyncope 03/17/2020  . Essential hypertension 03/17/2020  . Right ear pain 03/17/2020  . Excessive cerumen in both ear canals 03/17/2020  . Syncope and collapse 03/17/2020  . 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 Lumbar spondylosis 02/18/2018  . Primary osteoarthritis of both hands 07/11/2017  . Orthostatic hypotension 10/23/2016  . Osteoporosis 04/16/2014  . Migraines 10/09/2011   PCP:  Eartha Inch, MD Pharmacy:   [facility] [address], [place] - [address] [place] [address] Phone: [phone] Fax: [phone]  [facility] - [place], [place] - [address] [place] [address] Phone: [phone] Fax: [phone]  [phone] [place], [place] - [address] [facility] [address] [place] [address] Phone: [phone] Fax: [phone]  [facility] - [place], [place] - [address] [place] [address] Phone: [phone] Fax: [phone]     Social Determinants of Health (SDOH) Interventions    Readmission Risk Interventions No flowsheet data found.

## 2020-05-05 NOTE — Progress Notes (Signed)
Inpatient Rehab Admissions Coordinator:   I did receive insurance authorization for pt to admit to CIR; however, I do not have a bed available for this patient today.  Will continue to follow for potential admission pending bed availability, hopefully tomorrow.  Estill Dooms, PT, DPT Admissions Coordinator [phone] 05/05/20  12:09 PM

## 2020-05-10 ENCOUNTER — Encounter: Payer: Self-pay | Admitting: Internal Medicine

## 2020-05-10 ENCOUNTER — Encounter (HOSPITAL_COMMUNITY): Payer: Self-pay | Admitting: Emergency Medicine

## 2020-05-10 ENCOUNTER — Inpatient Hospital Stay (HOSPITAL_COMMUNITY)
Admission: EM | Admit: 2020-05-10 | Discharge: 2020-05-14 | DRG: 522 | Disposition: A | Discharge status: Home Health | Payer: Medicare Other | Attending: Internal Medicine | Admitting: Internal Medicine

## 2020-05-10 ENCOUNTER — Ambulatory Visit: Payer: Medicare Other | Admitting: Internal Medicine

## 2020-05-10 ENCOUNTER — Other Ambulatory Visit: Payer: Self-pay

## 2020-05-10 ENCOUNTER — Emergency Department (HOSPITAL_COMMUNITY): Payer: Medicare Other

## 2020-05-10 VITALS — BP 138/75 | HR 68 | Temp 97.3°F | Ht 63.0 in | Wt 130.0 lb

## 2020-05-10 DIAGNOSIS — Z8781 Personal history of (healed) traumatic fracture: Secondary | ICD-10-CM

## 2020-05-10 DIAGNOSIS — R55 Syncope and collapse: Secondary | ICD-10-CM

## 2020-05-10 DIAGNOSIS — W19XXXA Unspecified fall, initial encounter: Secondary | ICD-10-CM

## 2020-05-10 DIAGNOSIS — I5032 Chronic diastolic (congestive) heart failure: Secondary | ICD-10-CM | POA: Diagnosis not present

## 2020-05-10 DIAGNOSIS — D62 Acute posthemorrhagic anemia: Secondary | ICD-10-CM | POA: Diagnosis not present

## 2020-05-10 DIAGNOSIS — I10 Essential (primary) hypertension: Secondary | ICD-10-CM | POA: Diagnosis present

## 2020-05-10 DIAGNOSIS — S72012A Unspecified intracapsular fracture of left femur, initial encounter for closed fracture: Secondary | ICD-10-CM | POA: Diagnosis not present

## 2020-05-10 DIAGNOSIS — S72009A Fracture of unspecified part of neck of unspecified femur, initial encounter for closed fracture: Secondary | ICD-10-CM | POA: Diagnosis not present

## 2020-05-10 DIAGNOSIS — Z8673 Personal history of transient ischemic attack (TIA), and cerebral infarction without residual deficits: Secondary | ICD-10-CM

## 2020-05-10 DIAGNOSIS — B370 Candidal stomatitis: Secondary | ICD-10-CM | POA: Diagnosis present

## 2020-05-10 DIAGNOSIS — M810 Age-related osteoporosis without current pathological fracture: Secondary | ICD-10-CM | POA: Diagnosis present

## 2020-05-10 DIAGNOSIS — D6959 Other secondary thrombocytopenia: Secondary | ICD-10-CM | POA: Diagnosis present

## 2020-05-10 DIAGNOSIS — Z7902 Long term (current) use of antithrombotics/antiplatelets: Secondary | ICD-10-CM

## 2020-05-10 DIAGNOSIS — R42 Dizziness and giddiness: Secondary | ICD-10-CM

## 2020-05-10 DIAGNOSIS — Y92009 Unspecified place in unspecified non-institutional (private) residence as the place of occurrence of the external cause: Secondary | ICD-10-CM

## 2020-05-10 DIAGNOSIS — G918 Other hydrocephalus: Secondary | ICD-10-CM | POA: Diagnosis present

## 2020-05-10 DIAGNOSIS — W06XXXA Fall from bed, initial encounter: Secondary | ICD-10-CM | POA: Diagnosis present

## 2020-05-10 DIAGNOSIS — F0150 Vascular dementia without behavioral disturbance: Secondary | ICD-10-CM | POA: Diagnosis present

## 2020-05-10 DIAGNOSIS — I110 Hypertensive heart disease with heart failure: Secondary | ICD-10-CM | POA: Diagnosis present

## 2020-05-10 DIAGNOSIS — Z9889 Other specified postprocedural states: Secondary | ICD-10-CM

## 2020-05-10 DIAGNOSIS — I951 Orthostatic hypotension: Secondary | ICD-10-CM

## 2020-05-10 DIAGNOSIS — I639 Cerebral infarction, unspecified: Secondary | ICD-10-CM

## 2020-05-10 DIAGNOSIS — Z79899 Other long term (current) drug therapy: Secondary | ICD-10-CM

## 2020-05-10 DIAGNOSIS — Y92013 Bedroom of single-family (private) house as the place of occurrence of the external cause: Secondary | ICD-10-CM

## 2020-05-10 DIAGNOSIS — S72002A Fracture of unspecified part of neck of left femur, initial encounter for closed fracture: Secondary | ICD-10-CM

## 2020-05-10 DIAGNOSIS — R627 Adult failure to thrive: Secondary | ICD-10-CM | POA: Diagnosis present

## 2020-05-10 DIAGNOSIS — Z20822 Contact with and (suspected) exposure to covid-19: Secondary | ICD-10-CM | POA: Diagnosis present

## 2020-05-10 DIAGNOSIS — I69391 Dysphagia following cerebral infarction: Secondary | ICD-10-CM

## 2020-05-10 DIAGNOSIS — I63511 Cerebral infarction due to unspecified occlusion or stenosis of right middle cerebral artery: Secondary | ICD-10-CM

## 2020-05-10 DIAGNOSIS — R1310 Dysphagia, unspecified: Secondary | ICD-10-CM | POA: Diagnosis present

## 2020-05-10 HISTORY — DX: Other hydrocephalus: G91.8

## 2020-05-10 HISTORY — DX: Heart failure, unspecified: I50.9

## 2020-05-10 MED ORDER — FENTANYL CITRATE (PF) 100 MCG/2ML IJ SOLN
25.0000 ug | Freq: Once | INTRAMUSCULAR | Status: AC
  Administered 2020-05-10: 25 ug via INTRAVENOUS
  Filled 2020-05-10: qty 2

## 2020-05-10 NOTE — ED Provider Notes (Signed)
[facility]-EMERGENCY DEPT Provider Note   CSN: [id number] Arrival date & time: 05/10/20  2111     History Chief Complaint  Patient presents with  . Fall    Kristin Rush is a 79 y.o. female presenting for evaluation of hip pain after fall.  Level V caveat due to dementia.   Patient states she was getting into bed when she fell, landing on her left hip. She did hit her head, but did not lose consciousness. Since then, she has had persistent left hip pain. She denies headache, neck pain, chest pain, back pain, nausea or vomiting, loss of bowel bladder control, numbness, tingling.  Additional history obtained from chart review. Patient with recent CVA, on Plavix and aspirin. Additional history of hypertension, migraines, osteoporosis. She follows with orthopedics with [facility].  HPI     Past Medical History:  Diagnosis Date  . 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 Lumbar spondylosis 02/18/2018  . Migraine   . Migraines 10/09/2011  . Orthostatic hypotension 10/23/2016  . Osteoporosis 04/16/2014  . Primary osteoarthritis of both hands 07/11/2017    Patient Active Problem List   Diagnosis Date Noted  . CVA (cerebral vascular accident) (HCC) 04/29/2020  . Acute CVA (cerebrovascular accident) (HCC) 04/28/2020  . 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 Frequent falls 03/17/2020  . Postural dizziness with presyncope 03/17/2020  . Essential hypertension 03/17/2020  . Right ear pain 03/17/2020  . Excessive cerumen in both ear canals 03/17/2020  . Syncope and collapse 03/17/2020  . 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 Lumbar spondylosis 02/18/2018  . Primary osteoarthritis of both hands 07/11/2017  . Orthostatic hypotension 10/23/2016  . Osteoporosis 04/16/2014  . Migraines 10/09/2011    No past surgical history on file.   OB History   No obstetric history on file.     Family  History  Family history unknown: Yes    Social History   Tobacco Use  . Smoking status: Never Smoker  . Smokeless tobacco: Never Used  Substance Use Topics  . Alcohol use: No  . Drug use: No    Home Medications Prior to Admission medications   Medication Sig Start Date End Date Taking? Authorizing Provider  acetaminophen (TYLENOL) 500 MG tablet Take 500 mg by mouth daily as needed for mild pain.     [provider]  ALPRAZolam Prudy Feeler) 0.25 MG tablet Take 1 tablet (0.25 mg total) by mouth 2 (two) times daily as needed for anxiety. 05/05/20   Joseph Art, DO  aspirin EC 81 MG EC tablet Take 1 tablet (81 mg total) by mouth daily. Swallow whole. 05/06/20   Joseph Art, DO  atorvastatin (LIPITOR) 80 MG tablet Take 1 tablet (80 mg total) by mouth daily at 6 PM. 05/05/20   Joseph Art, DO  calcium-vitamin D (OSCAL WITH D) 500-200 MG-UNIT per tablet Take 1 tablet by mouth daily.    [provider]  clopidogrel (PLAVIX) 75 MG tablet Take 1 tablet (75 mg total) by mouth daily. 05/06/20   Joseph Art, DO  cyanocobalamin 1000 MCG tablet Take 1,000 mcg by mouth daily. 04/19/18   [provider]  escitalopram (LEXAPRO) 10 MG tablet Take 10 mg by mouth daily.    [provider]  Maltodextrin-Xanthan Gum (RESOURCE THICKENUP CLEAR) POWD To make liquids honey thick 05/05/20   Marlin Canary U, DO  midodrine (PROAMATINE) 2.5 MG tablet Take 1 tablet (2.5 mg  total) by mouth at bedtime. 05/05/20   Joseph Art, DO  midodrine (PROAMATINE) 5 MG tablet Take 1 tablet (5 mg total) by mouth 2 (two) times daily with a meal. 05/05/20   Joseph Art, DO  Multiple Vitamins-Minerals (PRESERVISION AREDS) TABS Take 1 tablet by mouth daily.    [provider]  polyethylene glycol powder (GLYCOLAX/MIRALAX) 17 GM/SCOOP powder as needed. 03/23/20   [provider]    Allergies    Codeine and Sulfa antibiotics  Review of Systems   Review of Systems    Musculoskeletal: Positive for arthralgias.  Hematological: Bruises/bleeds easily.  All other systems reviewed and are negative.   Physical Exam Updated Vital Signs BP (!) 153/83 (BP Location: Right Arm)   Pulse 81   Temp (!) 97.5 F (36.4 C) (Oral)   Resp 18   SpO2 94%   Physical Exam Vitals and nursing note reviewed.  Constitutional:      General: She is not in acute distress.    Appearance: She is well-developed.     Comments: Resting in the bed in no acute distress  HENT:     Head: Normocephalic and atraumatic.     Comments: No obvious signs of head trauma. Eyes:     Extraocular Movements: Extraocular movements intact.     Conjunctiva/sclera: Conjunctivae normal.     Pupils: Pupils are equal, round, and reactive to light.  Neck:     Comments: No ttp of midline c-spine Cardiovascular:     Rate and Rhythm: Normal rate and regular rhythm.     Pulses: Normal pulses.  Pulmonary:     Effort: Pulmonary effort is normal. No respiratory distress.     Breath sounds: Normal breath sounds. No wheezing.  Chest:     Chest wall: No tenderness.  Abdominal:     General: There is no distension.     Palpations: Abdomen is soft. There is no mass.     Tenderness: There is no abdominal tenderness. There is no guarding or rebound.  Musculoskeletal:        General: Tenderness present.     Cervical back: Normal range of motion and neck supple.     Comments: Tenderness palpation of the left hip. Left leg shortened and externally rotated. Pedal pulses 2+ bilaterally. Good distal sensation and cap refill.  Skin:    General: Skin is warm and dry.     Capillary Refill: Capillary refill takes less than 2 seconds.  Neurological:     Mental Status: She is alert and oriented to person, place, and time.     ED Results / Procedures / Treatments   Labs (all labs ordered are listed, but only abnormal results are displayed) Labs Reviewed  SARS CORONAVIRUS 2 BY RT PCR (HOSPITAL ORDER, PERFORMED  IN [facility])  BASIC METABOLIC PANEL  CBC WITH DIFFERENTIAL/PLATELET  PROTIME-INR  TYPE AND SCREEN    EKG None  Radiology DG Chest 1 View  Result Date: 05/10/2020 CLINICAL DATA:  Larey Seat out of bed, left hip fracture EXAM: CHEST  1 VIEW COMPARISON:  None. FINDINGS: Single frontal view of the chest demonstrates an unremarkable cardiac silhouette. No airspace disease, effusion, or pneumothorax. Calcified pleural plaque at the left apex. No acute bony abnormalities. IMPRESSION: 1. No acute intrathoracic process. Electronically Signed   By: Sharlet Salina M.D.   On: 05/10/2020 21:54   CT Head Wo Contrast  Result Date: 05/10/2020 CLINICAL DATA:  Fall EXAM: CT HEAD WITHOUT CONTRAST CT  CERVICAL SPINE WITHOUT CONTRAST TECHNIQUE: Multidetector CT imaging of the head and cervical spine was performed following the standard protocol without intravenous contrast. Multiplanar CT image reconstructions of the cervical spine were also generated. COMPARISON:  CT, CT angiography head and neck, MRI head 04/28/2020 FINDINGS: CT HEAD FINDINGS Brain: Images are motion degraded despite multiple attempts at acquisition, may limit detection of subtle anomaly. Of all vein area of encephalomalacia in the right corona radiata extending into the white matter tracts compatible with an area of infarct seen on recent MRI. Additional bilateral lacunar type infarcts in the basal ganglia. Streak artifact across the temporal lobes and pons/medulla. No convincing evidence of acute infarction, hemorrhage, hydrocephalus, extra-axial collection or mass lesion/mass effect. Symmetric prominence of the ventricles, cisterns and sulci compatible with parenchymal volume loss. Patchy areas of white matter hypoattenuation are most compatible with chronic microvascular angiopathy. Vascular: Atherosclerotic calcification of the carotid siphons and intradural vertebral arteries. No hyperdense vessel. Skull: No significant scalp swelling  or hematoma. No calvarial fracture or worrisome osseous lesions. Stable benign sessile osteoma of the frontal bone (4/23). Sinuses/Orbits: Paranasal sinuses and mastoid air cells are predominantly clear. Included orbital structures are unremarkable. Other: None. CT CERVICAL SPINE FINDINGS Alignment: Stabilization collar is absent at the time of examination. There is preservation of normal cervical lordosis. Some mild retrolisthesis C4 on C5 is unchanged from comparison. No evidence of traumatic listhesis. No abnormally widened, perched or jumped facets. Normal alignment of the craniocervical and atlantoaxial articulations. Skull base and vertebrae: No acute skull base fracture. No vertebral body fracture or height loss. Normal bone mineralization. No worrisome osseous lesions. Small vertebral body hemangioma at C7 is unchanged from comparison. Arthrosis at the atlantodental and basion dens interval with some hypertrophic spurring and degenerative ossifications. Multilevel spondylitic changes as detailed below. Soft tissues and spinal canal: No pre or paravertebral fluid or swelling. No visible canal hematoma. Disc levels: Multilevel intervertebral disc height loss with spondylitic endplate changes. Multilevel disc osteophyte complexes are present with some mild multilevel spinal canal stenosis C3-C6 and more moderate central canal stenosis C6-7. Multilevel uncinate spurring and facet hypertrophic changes as well resulting in mild-to-moderate multilevel foraminal narrowing with more moderate to severe narrowing C4-5 and C5-6 bilaterally. Upper chest: No acute abnormality in the upper chest or imaged lung apices. Calcifications of the cervical carotids and proximal great vessels. Other: Normal thyroid gland. IMPRESSION: 1. Images are motion degraded despite multiple attempts at acquisition, may limit detection of subtle anomaly. 2. No convincing evidence of acute intracranial abnormality. No significant scalp swelling  or calvarial fracture. 3. Chronic microvascular angiopathy and parenchymal volume loss. 4. Expected evolution of encephalomalacia in the right corona radiata extending into the white matter tracts corresponding to the infarct seen on comparison MRI. 5. Remote lacunar type infarcts in the bilateral basal ganglia. 6. No evidence of acute fracture or traumatic listhesis of the cervical spine. 7. Multilevel degenerative changes of the cervical spine as described above. 8. Intracranial and cervical atherosclerosis. Electronically Signed   By: Kreg ShropshirePrice  DeHay M.D.   On: 05/10/2020 22:22   CT Cervical Spine Wo Contrast  Result Date: 05/10/2020 CLINICAL DATA:  Fall EXAM: CT HEAD WITHOUT CONTRAST CT CERVICAL SPINE WITHOUT CONTRAST TECHNIQUE: Multidetector CT imaging of the head and cervical spine was performed following the standard protocol without intravenous contrast. Multiplanar CT image reconstructions of the cervical spine were also generated. COMPARISON:  CT, CT angiography head and neck, MRI head 04/28/2020 FINDINGS: CT HEAD FINDINGS Brain: Images are motion  degraded despite multiple attempts at acquisition, may limit detection of subtle anomaly. Of all vein area of encephalomalacia in the right corona radiata extending into the white matter tracts compatible with an area of infarct seen on recent MRI. Additional bilateral lacunar type infarcts in the basal ganglia. Streak artifact across the temporal lobes and pons/medulla. No convincing evidence of acute infarction, hemorrhage, hydrocephalus, extra-axial collection or mass lesion/mass effect. Symmetric prominence of the ventricles, cisterns and sulci compatible with parenchymal volume loss. Patchy areas of white matter hypoattenuation are most compatible with chronic microvascular angiopathy. Vascular: Atherosclerotic calcification of the carotid siphons and intradural vertebral arteries. No hyperdense vessel. Skull: No significant scalp swelling or hematoma. No  calvarial fracture or worrisome osseous lesions. Stable benign sessile osteoma of the frontal bone (4/23). Sinuses/Orbits: Paranasal sinuses and mastoid air cells are predominantly clear. Included orbital structures are unremarkable. Other: None. CT CERVICAL SPINE FINDINGS Alignment: Stabilization collar is absent at the time of examination. There is preservation of normal cervical lordosis. Some mild retrolisthesis C4 on C5 is unchanged from comparison. No evidence of traumatic listhesis. No abnormally widened, perched or jumped facets. Normal alignment of the craniocervical and atlantoaxial articulations. Skull base and vertebrae: No acute skull base fracture. No vertebral body fracture or height loss. Normal bone mineralization. No worrisome osseous lesions. Small vertebral body hemangioma at C7 is unchanged from comparison. Arthrosis at the atlantodental and basion dens interval with some hypertrophic spurring and degenerative ossifications. Multilevel spondylitic changes as detailed below. Soft tissues and spinal canal: No pre or paravertebral fluid or swelling. No visible canal hematoma. Disc levels: Multilevel intervertebral disc height loss with spondylitic endplate changes. Multilevel disc osteophyte complexes are present with some mild multilevel spinal canal stenosis C3-C6 and more moderate central canal stenosis C6-7. Multilevel uncinate spurring and facet hypertrophic changes as well resulting in mild-to-moderate multilevel foraminal narrowing with more moderate to severe narrowing C4-5 and C5-6 bilaterally. Upper chest: No acute abnormality in the upper chest or imaged lung apices. Calcifications of the cervical carotids and proximal great vessels. Other: Normal thyroid gland. IMPRESSION: 1. Images are motion degraded despite multiple attempts at acquisition, may limit detection of subtle anomaly. 2. No convincing evidence of acute intracranial abnormality. No significant scalp swelling or calvarial  fracture. 3. Chronic microvascular angiopathy and parenchymal volume loss. 4. Expected evolution of encephalomalacia in the right corona radiata extending into the white matter tracts corresponding to the infarct seen on comparison MRI. 5. Remote lacunar type infarcts in the bilateral basal ganglia. 6. No evidence of acute fracture or traumatic listhesis of the cervical spine. 7. Multilevel degenerative changes of the cervical spine as described above. 8. Intracranial and cervical atherosclerosis. Electronically Signed   By: Kreg Shropshire M.D.   On: 05/10/2020 22:22   DG Hip Unilat With Pelvis 2-3 Views Left  Result Date: 05/10/2020 CLINICAL DATA:  Larey Seat out of bed, left leg deformity EXAM: DG HIP (WITH OR WITHOUT PELVIS) 2-3V LEFT COMPARISON:  None. FINDINGS: Frontal view of the pelvis as well as frontal and cross-table lateral views of the left hip are obtained. There is a subcapital left femoral neck fracture with impaction and varus angulation at the fracture site. No dislocation. The remainder of the bony pelvis is unremarkable. IMPRESSION: 1. Subcapital left femoral neck fracture as above. Electronically Signed   By: Sharlet Salina M.D.   On: 05/10/2020 21:54    Procedures Procedures (including critical care time)  Medications Ordered in ED Medications  fentaNYL (SUBLIMAZE) injection 25 mcg (  has no administration in time range)    ED Course  I have reviewed the triage vital signs and the nursing notes.  Pertinent labs & imaging results that were available during my care of the patient were reviewed by me and considered in my medical decision making (see chart for details).  Clinical Course as of May 11 2315  Mon May 10, 2020  4574 79 year old female history of dementia had a fall at home trying to get out of bed.  Obvious shortening left lower extremity.  Getting imaging and admission labs including Covid testing.  Anticipate admission for hip fracture.   [MB]    Clinical Course User  Index [MB] Terrilee Files, MD   MDM Rules/Calculators/A&P                          Patient is in for evaluation of left hip pain after fall. On exam, patient appears nontoxic. She does have tenderness palpation of the left hip, shortened externally rotated. Likely hip fracture. She also fell and hit her head, is on Plavix and aspirin. Also, will obtain CT head and neck. Will obtain preop labs and EKG and x-ray.  Hypoxia viewed interpreted by me, consistent with left hip fracture. Chest x-ray without acute findings such as fracture or pulmonary injury. CT head and neck negative for acute findings. Will consult orthopedics.  Discussed with Dr. Dion Saucier from orthopedics, who states patient will be evaluated by someone on the orthopedic team tomorrow morning, likely surgery tomorrow. Requests admission to medicine.  Pt signed out to Ree Shay, PA-C for f/u on labs and admission to medicine.   Final Clinical Impression(s) / ED Diagnoses Final diagnoses:  Closed fracture of left hip, initial encounter [facility])  Fall, initial encounter    Rx / DC Orders ED Discharge Orders    None       Alveria Apley, PA-C 05/10/20 2316    Terrilee Files, MD 05/11/20 1047

## 2020-05-10 NOTE — Progress Notes (Signed)
Called regarding left hip fracture.  CVA last month apparently.  On aspirin and plavix.   Plan for left hip hemiarthroplasty tomorrow 8/31 if medically optimized.  Npo after midnight.    Full consult to follow.   Eulas Post, MD

## 2020-05-10 NOTE — Patient Instructions (Signed)
Medication Instructions:  No Changes In Medications at this time.  *If you need a refill on your cardiac medications before your next appointment, please call your pharmacy*  Lab Work: None Ordered At This Time.  If you have labs (blood work) drawn today and your tests are completely normal, you will receive your results only by: . MyChart Message (if you have MyChart) OR . A paper copy in the mail If you have any lab test that is abnormal or we need to change your treatment, we will call you to review the results.  Testing/Procedures: None Ordered At This Time.   Follow-Up: At [facility], you and your health needs are our priority.  As part of our continuing mission to provide you with exceptional heart care, we have created designated Provider Care Teams.  These Care Teams include your primary Cardiologist (physician) and Advanced Practice Providers (APPs -  Physician Assistants and Nurse Practitioners) who all work together to provide you with the care you need, when you need it.  Your next appointment:   6 month(s)  The format for your next appointment:   In Person  Provider:   Gayatri Acharya, MD   

## 2020-05-10 NOTE — Progress Notes (Signed)
Cardiology Office Note:    Date:  05/10/2020   ID:  Kristin Rush, Kristin Rush March 24, 1941, MRN [id number]  PCP:  Eartha Inch, MD  Cardiologist:  No primary care provider on file.  Electrophysiologist:  None   Referring MD: Eartha Inch, MD   Chief Complaint: Orthostatic hypotension  History of Present Illness:    Kristin Rush is a 79 y.o. female with a history of hypertension, recent stroke, limited mobility, and possible prior diagnosis of hydrocephalus contributing to gait impairment and labile blood pressure.  Her sister is present with her for the visit and provides the majority of the collaborative history.  Patient was referred for further management of orthostatic hypotension.  She had an admission at the end of July for syncope, and approximately 3 weeks later had an admission to the hospital for left facial droop found to have an acute CVA involving the right corona radiata felt to represent small vessel ischemic disease.  She was started on aspirin and Plavix as well as statin.  She is continuing to struggle with recovery from stroke.  Her sister tells me that she will stand and immediately have a syncopal episode.  This symptom precedes her recent stroke.  Per family report, she has been seen by a neurosurgeon in [place] who has recommended interventricular shunt.  I have reviewed neurosurgery consultation by Dr. Petra Kuba from 2019.  Recommendation was for stroke work-up and possibility of normal pressure hydrocephalus as a contributor.  The patient had neurology follow-up scheduled after her stroke admission, however family has canceled their post hospital follow-up with neurology in favor of neurosurgical consultation.  I have strongly recommended follow-up with neurology for both the indication of recent stroke as well as question hydrocephalus as a contributor to her primary presenting concern today.  During her hospitalization she underwent echocardiography  documenting preserved EF of 60 to 65%, grade 2 diastolic dysfunction, trivial MR and no aortic stenosis.  We discussed the natural history of diastolic function, contributing risk factors, and management strategies going forward.  She has no signs of heart failure at this time, and her sister notes that she has no shortness of breath and no lower extremity edema.  Despite findings of grade 2 diastolic dysfunction, the patient has not been continued on antihypertensive therapy due to profound orthostatic hypotension requiring midodrine.  Orthostatic vital signs unable to be obtained today given the patient's inability to stand safely.  Patient denies chest pain, shortness of breath, palpitations.  Denies PND or orthopnea.  Her sister does describe recent syncopal episodes which seem largely related to autonomic dysfunction.   Past Medical History:  Diagnosis Date  . 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 Lumbar spondylosis 02/18/2018  . Migraine   . Migraines 10/09/2011  . Orthostatic hypotension 10-29-16   has passed out when up  . Osteoporosis 04/16/2014  . Primary osteoarthritis of both hands 07/11/2017    No past surgical history on file.  Current Medications: Current Meds  Medication Sig  . acetaminophen (TYLENOL) 500 MG tablet Take 500 mg by mouth daily as needed for mild pain.   Marland Kitchen ALPRAZolam (XANAX) 0.25 MG tablet Take 1 tablet (0.25 mg total) by mouth 2 (two) times daily as needed for anxiety.  Marland Kitchen aspirin EC 81 MG EC tablet Take 1 tablet (81 mg total) by mouth daily. Swallow whole.  Marland Kitchen atorvastatin (LIPITOR) 80 MG tablet Take 1 tablet (80 mg total) by mouth daily at  6 PM.  . calcium-vitamin D (OSCAL WITH D) 500-200 MG-UNIT per tablet Take 1 tablet by mouth daily.  . clopidogrel (PLAVIX) 75 MG tablet Take 1 tablet (75 mg total) by mouth daily.  . cyanocobalamin 1000 MCG tablet Take 1,000 mcg by mouth daily.  Marland Kitchen escitalopram (LEXAPRO) 10  MG tablet Take 10 mg by mouth daily.  . Maltodextrin-Xanthan Gum (RESOURCE THICKENUP CLEAR) POWD To make liquids honey thick  . midodrine (PROAMATINE) 2.5 MG tablet Take 1 tablet (2.5 mg total) by mouth at bedtime.  . midodrine (PROAMATINE) 5 MG tablet Take 1 tablet (5 mg total) by mouth 2 (two) times daily with a meal.  . Multiple Vitamins-Minerals (PRESERVISION AREDS) TABS Take 1 tablet by mouth daily.  . [DISCONTINUED] polyethylene glycol powder (GLYCOLAX/MIRALAX) 17 GM/SCOOP powder as needed.     Allergies:   Codeine and Sulfa antibiotics   Social History   Tobacco Use  . Smoking status: Never Smoker  . Smokeless tobacco: Never Used  Substance Use Topics  . Alcohol use: No  . Drug use: No     Family History: The patient's family history is negative for Heart disease.  ROS:   Please see the history of present illness.    All other systems reviewed and are negative.  EKGs/Labs/Other Studies Reviewed:    The following studies were reviewed today:  EKG: Sinus rhythm, nonspecific ST abnormality  Recent Labs: 03/17/2020: TSH 2.896 03/23/2020: Magnesium 2.2 04/28/2020: ALT 23 05/10/2020: BUN 14; Creatinine, Ser 0.91; Hemoglobin 13.0; Platelets 146; Potassium 4.3; Sodium 139  Recent Lipid Panel    Component Value Date/Time   CHOL 261 (H) 04/29/2020 0817   TRIG 62 04/29/2020 0817   HDL 66 04/29/2020 0817   CHOLHDL 4.0 04/29/2020 0817   VLDL 12 04/29/2020 0817   LDLCALC 183 (H) 04/29/2020 0817    Physical Exam:    VS:  BP 138/75   Pulse 68   Temp (!) 97.3 F (36.3 C)   Ht 5\' 3"  (1.6 m)   Wt 130 lb (59 kg)   SpO2 99%   BMI 23.03 kg/m     Wt Readings from Last 5 Encounters:  05/11/20 130 lb 1.1 oz (59 kg)  05/10/20 130 lb (59 kg)  04/30/20 129 lb 10.1 oz (58.8 kg)  03/16/20 135 lb (61.2 kg)  10/05/11 139 lb (63 kg)     Constitutional: No acute distress, sitting in wheelchair Eyes: sclera non-icteric, normal conjunctiva and lids ENMT: normal dentition, moist  mucous membranes Cardiovascular: regular rhythm, normal rate, no murmurs. S1 and S2 normal. Radial pulses normal bilaterally. No jugular venous distention.  Respiratory: clear to auscultation bilaterally GI : normal bowel sounds, soft and nontender. No distention.   MSK: extremities warm, well perfused. No edema.  NEURO: Limited mobility, unable to fully assess.  Patient responds appropriately to questions PSYCH: alert, oriented to person, pleasant  ASSESSMENT:    1. Orthostatic hypotension   2. Essential hypertension   3. Chronic diastolic CHF (congestive heart failure) (HCC)   4. Syncope and collapse   5. Acute CVA (cerebrovascular accident) (HCC)   6. Postural dizziness with presyncope    PLAN:    I have reviewed etiologies for orthostatic hypotension in detail today with the patient and her sister.  Her orthostatic symptoms are longstanding, several years in duration.  She has been started on midodrine but does not have significant benefit with regard to postural syncope.  She has signs of dilated brain ventricles and it has been  suggested in prior notes that she may even have normal pressure hydrocephalus.  I am concerned that the primary origin of her orthostatic hypotension is neurologic in nature.  I would hesitate to initiate any further medications for blood pressure support until this issue is better situated.  She and her sister agree and will follow up with neurology and neurosurgery.  I have strongly recommended the use of compression stockings 24/7, particularly if she plans to stand.  Would also consider the use of an abdominal binder for any benefit this may provide.  For history of diastolic dysfunction, she has no signs of congestive heart failure today.  She appears well compensated.  We have discussed avoiding salt, mitigating risk factors-though this is somewhat of a nonissue at the moment given she has hypotension as opposed to hypertension.  I would like to see her in  follow-up if she would like to continue to monitor her grade 2 diastolic dysfunction and stay ahead of any possible decompensations of heart failure.  She has hyperlipidemia and is currently on statin therapy.  I discussed with her sister that we will arrange follow-up, at their request I will see her back in 6 months.  She may continue midodrine if it is helpful, I have given precautions to the use of midodrine including possibility of hypertension with use.  They understand and will call our office with any questions.  Total time of encounter: 45 minutes total time of encounter, including 27 minutes spent in face-to-face patient care on the date of this encounter. This time includes coordination of care and counseling regarding above mentioned problem list. Remainder of non-face-to-face time involved reviewing chart documents/testing relevant to the patient encounter and documentation in the medical record. I have independently reviewed documentation from referring provider.   Weston Brass, MD [place]  [facility]    Medication Adjustments/Labs and Tests Ordered: Current medicines are reviewed at length with the patient today.  Concerns regarding medicines are outlined above.  Orders Placed This Encounter  Procedures  . EKG 12-Lead   No orders of the defined types were placed in this encounter.   Patient Instructions  Medication Instructions:  No Changes In Medications at this time.   *If you need a refill on your cardiac medications before your next appointment, please call your pharmacy*  Lab Work: None Ordered At This Time.   If you have labs (blood work) drawn today and your tests are completely normal, you will receive your results only by: Marland Kitchen MyChart Message (if you have MyChart) OR . A paper copy in the mail If you have any lab test that is abnormal or we need to change your treatment, we will call you to review the results.  Testing/Procedures: None Ordered At  This Time.    Follow-Up: At [facility], you and your health needs are our priority.  As part of our continuing mission to provide you with exceptional heart care, we have created designated Provider Care Teams.  These Care Teams include your primary Cardiologist (physician) and Advanced Practice Providers (APPs -  Physician Assistants and Nurse Practitioners) who all work together to provide you with the care you need, when you need it.  Your next appointment:   6 month(s)  The format for your next appointment:   In Person  Provider:   Weston Brass, MD

## 2020-05-10 NOTE — ED Triage Notes (Signed)
79 yo female BIBA from home. Pt has history of dementia. Family called stating that pt fell while trying to get out of bed this evening.Pts fall was unwitnessed. Pt has obvious shortening and rotation of the left hip per EMS. Pt has no complaints of pain unless moved. No other complaints at this time.    Vitals: bp 112/70 Hr 78 rr 18 spo2 97% on RA 20 g in left ac

## 2020-05-11 ENCOUNTER — Encounter (HOSPITAL_COMMUNITY): Payer: Self-pay | Admitting: Internal Medicine

## 2020-05-11 ENCOUNTER — Encounter (HOSPITAL_COMMUNITY)
Admission: EM | Disposition: A | Discharge status: Home Health | Payer: Self-pay | Source: Home / Self Care | Attending: Internal Medicine

## 2020-05-11 ENCOUNTER — Inpatient Hospital Stay (HOSPITAL_COMMUNITY): Payer: Medicare Other | Admitting: Anesthesiology

## 2020-05-11 ENCOUNTER — Inpatient Hospital Stay (HOSPITAL_COMMUNITY): Payer: Medicare Other

## 2020-05-11 ENCOUNTER — Other Ambulatory Visit: Payer: Self-pay

## 2020-05-11 DIAGNOSIS — Z7902 Long term (current) use of antithrombotics/antiplatelets: Secondary | ICD-10-CM | POA: Diagnosis not present

## 2020-05-11 DIAGNOSIS — G918 Other hydrocephalus: Secondary | ICD-10-CM | POA: Diagnosis present

## 2020-05-11 DIAGNOSIS — S72002A Fracture of unspecified part of neck of left femur, initial encounter for closed fracture: Secondary | ICD-10-CM | POA: Diagnosis present

## 2020-05-11 DIAGNOSIS — I951 Orthostatic hypotension: Secondary | ICD-10-CM | POA: Diagnosis present

## 2020-05-11 DIAGNOSIS — Y92009 Unspecified place in unspecified non-institutional (private) residence as the place of occurrence of the external cause: Secondary | ICD-10-CM

## 2020-05-11 DIAGNOSIS — I69391 Dysphagia following cerebral infarction: Secondary | ICD-10-CM | POA: Diagnosis not present

## 2020-05-11 DIAGNOSIS — Y92013 Bedroom of single-family (private) house as the place of occurrence of the external cause: Secondary | ICD-10-CM | POA: Diagnosis not present

## 2020-05-11 DIAGNOSIS — R1310 Dysphagia, unspecified: Secondary | ICD-10-CM | POA: Diagnosis present

## 2020-05-11 DIAGNOSIS — I63511 Cerebral infarction due to unspecified occlusion or stenosis of right middle cerebral artery: Secondary | ICD-10-CM | POA: Diagnosis present

## 2020-05-11 DIAGNOSIS — D62 Acute posthemorrhagic anemia: Secondary | ICD-10-CM | POA: Diagnosis not present

## 2020-05-11 DIAGNOSIS — W19XXXA Unspecified fall, initial encounter: Secondary | ICD-10-CM

## 2020-05-11 DIAGNOSIS — B370 Candidal stomatitis: Secondary | ICD-10-CM | POA: Diagnosis present

## 2020-05-11 DIAGNOSIS — I10 Essential (primary) hypertension: Secondary | ICD-10-CM

## 2020-05-11 DIAGNOSIS — I5032 Chronic diastolic (congestive) heart failure: Secondary | ICD-10-CM | POA: Diagnosis present

## 2020-05-11 DIAGNOSIS — M810 Age-related osteoporosis without current pathological fracture: Secondary | ICD-10-CM | POA: Diagnosis present

## 2020-05-11 DIAGNOSIS — F0150 Vascular dementia without behavioral disturbance: Secondary | ICD-10-CM | POA: Diagnosis present

## 2020-05-11 DIAGNOSIS — W06XXXA Fall from bed, initial encounter: Secondary | ICD-10-CM | POA: Diagnosis present

## 2020-05-11 DIAGNOSIS — I110 Hypertensive heart disease with heart failure: Secondary | ICD-10-CM | POA: Diagnosis present

## 2020-05-11 DIAGNOSIS — R627 Adult failure to thrive: Secondary | ICD-10-CM | POA: Diagnosis present

## 2020-05-11 DIAGNOSIS — Z20822 Contact with and (suspected) exposure to covid-19: Secondary | ICD-10-CM | POA: Diagnosis present

## 2020-05-11 DIAGNOSIS — S72009A Fracture of unspecified part of neck of unspecified femur, initial encounter for closed fracture: Secondary | ICD-10-CM | POA: Diagnosis present

## 2020-05-11 DIAGNOSIS — S72012A Unspecified intracapsular fracture of left femur, initial encounter for closed fracture: Secondary | ICD-10-CM | POA: Diagnosis present

## 2020-05-11 DIAGNOSIS — Z8673 Personal history of transient ischemic attack (TIA), and cerebral infarction without residual deficits: Secondary | ICD-10-CM | POA: Diagnosis not present

## 2020-05-11 DIAGNOSIS — Z79899 Other long term (current) drug therapy: Secondary | ICD-10-CM | POA: Diagnosis not present

## 2020-05-11 DIAGNOSIS — D6959 Other secondary thrombocytopenia: Secondary | ICD-10-CM | POA: Diagnosis present

## 2020-05-11 HISTORY — PX: HIP ARTHROPLASTY: SHX981

## 2020-05-11 LAB — CBC WITH DIFFERENTIAL/PLATELET
Abs Immature Granulocytes: 0.09 10*3/uL — ABNORMAL HIGH (ref 0.00–0.07)
Basophils Absolute: 0 10*3/uL (ref 0.0–0.1)
Basophils Relative: 0 %
Eosinophils Absolute: 0.1 10*3/uL (ref 0.0–0.5)
Eosinophils Relative: 1 %
HCT: 40 % (ref 36.0–46.0)
Hemoglobin: 13 g/dL (ref 12.0–15.0)
Immature Granulocytes: 1 %
Lymphocytes Relative: 8 %
Lymphs Abs: 0.8 10*3/uL (ref 0.7–4.0)
MCH: 30.4 pg (ref 26.0–34.0)
MCHC: 32.5 g/dL (ref 30.0–36.0)
MCV: 93.7 fL (ref 80.0–100.0)
Monocytes Absolute: 0.5 10*3/uL (ref 0.1–1.0)
Monocytes Relative: 5 %
Neutro Abs: 7.7 10*3/uL (ref 1.7–7.7)
Neutrophils Relative %: 85 %
Platelets: 146 10*3/uL — ABNORMAL LOW (ref 150–400)
RBC: 4.27 MIL/uL (ref 3.87–5.11)
RDW: 13.4 % (ref 11.5–15.5)
WBC: 9.2 10*3/uL (ref 4.0–10.5)
nRBC: 0 % (ref 0.0–0.2)

## 2020-05-11 LAB — BASIC METABOLIC PANEL
Anion gap: 11 (ref 5–15)
BUN: 14 mg/dL (ref 8–23)
CO2: 26 mmol/L (ref 22–32)
Calcium: 9.2 mg/dL (ref 8.9–10.3)
Chloride: 102 mmol/L (ref 98–111)
Creatinine, Ser: 0.91 mg/dL (ref 0.44–1.00)
GFR calc Af Amer: >60 mL/min (ref >60)
GFR calc non Af Amer: >60 mL/min (ref >60)
Glucose, Bld: 103 mg/dL — ABNORMAL HIGH (ref 70–99)
Potassium: 4.3 mmol/L (ref 3.5–5.1)
Sodium: 139 mmol/L (ref 135–145)

## 2020-05-11 LAB — PROTIME-INR
INR: 1 (ref 0.8–1.2)
Prothrombin Time: 12.6 seconds (ref 11.4–15.2)

## 2020-05-11 LAB — PREPARE RBC (CROSSMATCH)

## 2020-05-11 LAB — SARS CORONAVIRUS 2 BY RT PCR (HOSPITAL ORDER, PERFORMED IN ~~LOC~~ HOSPITAL LAB): SARS Coronavirus 2: NEGATIVE

## 2020-05-11 LAB — ABO/RH: ABO/RH(D): A POS

## 2020-05-11 SURGERY — HEMIARTHROPLASTY, HIP, DIRECT ANTERIOR APPROACH, FOR FRACTURE
Anesthesia: General | Site: Hip | Laterality: Left

## 2020-05-11 MED ORDER — POLYETHYLENE GLYCOL 3350 17 G PO PACK: 17.0000 g | PACK | Freq: Every day | ORAL | Status: DC | PRN

## 2020-05-11 MED ORDER — MENTHOL 3 MG MT LOZG: 1.0000 | LOZENGE | OROMUCOSAL | Status: DC | PRN

## 2020-05-11 MED ORDER — PHENYLEPHRINE HCL (PRESSORS) 10 MG/ML IV SOLN
INTRAVENOUS | Status: DC | PRN
  Administered 2020-05-11: 80 ug via INTRAVENOUS

## 2020-05-11 MED ORDER — ROCURONIUM BROMIDE 10 MG/ML (PF) SYRINGE
PREFILLED_SYRINGE | INTRAVENOUS | Status: DC | PRN
  Administered 2020-05-11: 50 mg via INTRAVENOUS

## 2020-05-11 MED ORDER — CLOPIDOGREL BISULFATE 75 MG PO TABS
75.0000 mg | ORAL_TABLET | Freq: Every day | ORAL | Status: DC
  Filled 2020-05-11: qty 1

## 2020-05-11 MED ORDER — ALUM & MAG HYDROXIDE-SIMETH 200-200-20 MG/5ML PO SUSP: 30.0000 mL | ORAL | Status: DC | PRN

## 2020-05-11 MED ORDER — HYDROCODONE-ACETAMINOPHEN 7.5-325 MG PO TABS: 1.0000 | ORAL_TABLET | ORAL | Status: DC | PRN

## 2020-05-11 MED ORDER — MORPHINE SULFATE (PF) 2 MG/ML IV SOLN: 0.5000 mg | INTRAVENOUS | Status: DC | PRN

## 2020-05-11 MED ORDER — FENTANYL CITRATE (PF) 100 MCG/2ML IJ SOLN: 25.0000 ug | INTRAMUSCULAR | Status: DC | PRN

## 2020-05-11 MED ORDER — ONDANSETRON HCL 4 MG/2ML IJ SOLN: 4.0000 mg | Freq: Four times a day (QID) | INTRAMUSCULAR | Status: DC | PRN

## 2020-05-11 MED ORDER — PROPOFOL 10 MG/ML IV BOLUS
INTRAVENOUS | Status: DC | PRN
  Administered 2020-05-11: 100 mg via INTRAVENOUS

## 2020-05-11 MED ORDER — CEFAZOLIN SODIUM-DEXTROSE 2-4 GM/100ML-% IV SOLN
2.0000 g | INTRAVENOUS | Status: AC
  Administered 2020-05-11: 2 g via INTRAVENOUS
  Filled 2020-05-11: qty 100

## 2020-05-11 MED ORDER — LACTATED RINGERS IV SOLN: INTRAVENOUS | Status: DC | PRN

## 2020-05-11 MED ORDER — ESCITALOPRAM OXALATE 10 MG PO TABS
10.0000 mg | ORAL_TABLET | Freq: Every day | ORAL | Status: DC
  Administered 2020-05-12 – 2020-05-13 (×2): 10 mg via ORAL
  Filled 2020-05-11 (×2): qty 1

## 2020-05-11 MED ORDER — LIDOCAINE 2% (20 MG/ML) 5 ML SYRINGE
INTRAMUSCULAR | Status: AC
  Filled 2020-05-11: qty 5

## 2020-05-11 MED ORDER — CLOPIDOGREL BISULFATE 75 MG PO TABS
75.0000 mg | ORAL_TABLET | Freq: Every day | ORAL | Status: DC
  Administered 2020-05-12 – 2020-05-14 (×3): 75 mg via ORAL
  Filled 2020-05-11 (×3): qty 1

## 2020-05-11 MED ORDER — BISACODYL 10 MG RE SUPP: 10.0000 mg | Freq: Every day | RECTAL | Status: DC | PRN

## 2020-05-11 MED ORDER — KETOROLAC TROMETHAMINE 15 MG/ML IJ SOLN
7.5000 mg | Freq: Four times a day (QID) | INTRAMUSCULAR | Status: AC
  Administered 2020-05-11 – 2020-05-12 (×2): 7.5 mg via INTRAVENOUS
  Filled 2020-05-11 (×2): qty 1

## 2020-05-11 MED ORDER — POTASSIUM CHLORIDE IN NACL 20-0.45 MEQ/L-% IV SOLN
INTRAVENOUS | Status: DC
  Filled 2020-05-11 (×2): qty 1000

## 2020-05-11 MED ORDER — ONDANSETRON HCL 4 MG PO TABS: 4.0000 mg | ORAL_TABLET | Freq: Four times a day (QID) | ORAL | Status: DC | PRN

## 2020-05-11 MED ORDER — LACTATED RINGERS IV SOLN: INTRAVENOUS | Status: DC

## 2020-05-11 MED ORDER — DOCUSATE SODIUM 100 MG PO CAPS
100.0000 mg | ORAL_CAPSULE | Freq: Two times a day (BID) | ORAL | Status: DC
  Administered 2020-05-11 – 2020-05-13 (×4): 100 mg via ORAL
  Filled 2020-05-11 (×6): qty 1

## 2020-05-11 MED ORDER — PROPOFOL 10 MG/ML IV BOLUS
INTRAVENOUS | Status: AC
  Filled 2020-05-11: qty 20

## 2020-05-11 MED ORDER — LIDOCAINE 2% (20 MG/ML) 5 ML SYRINGE
INTRAMUSCULAR | Status: DC | PRN
  Administered 2020-05-11: 50 mg via INTRAVENOUS

## 2020-05-11 MED ORDER — ALPRAZOLAM 0.25 MG PO TABS
0.2500 mg | ORAL_TABLET | Freq: Two times a day (BID) | ORAL | Status: DC | PRN
  Administered 2020-05-13: 0.25 mg via ORAL
  Filled 2020-05-11: qty 1

## 2020-05-11 MED ORDER — MIDODRINE HCL 5 MG PO TABS
5.0000 mg | ORAL_TABLET | Freq: Two times a day (BID) | ORAL | Status: DC
  Administered 2020-05-12 – 2020-05-14 (×5): 5 mg via ORAL
  Filled 2020-05-11 (×7): qty 1

## 2020-05-11 MED ORDER — FERROUS SULFATE 325 (65 FE) MG PO TABS
325.0000 mg | ORAL_TABLET | Freq: Three times a day (TID) | ORAL | Status: DC
  Administered 2020-05-11 – 2020-05-14 (×8): 325 mg via ORAL
  Filled 2020-05-11 (×7): qty 1

## 2020-05-11 MED ORDER — HYDROCODONE-ACETAMINOPHEN 5-325 MG PO TABS: 1.0000 | ORAL_TABLET | ORAL | Status: DC | PRN

## 2020-05-11 MED ORDER — ENOXAPARIN SODIUM 40 MG/0.4ML ~~LOC~~ SOLN
40.0000 mg | SUBCUTANEOUS | Status: DC
  Administered 2020-05-12: 40 mg via SUBCUTANEOUS
  Filled 2020-05-11: qty 0.4

## 2020-05-11 MED ORDER — FENTANYL CITRATE (PF) 100 MCG/2ML IJ SOLN
INTRAMUSCULAR | Status: AC
  Filled 2020-05-11: qty 2

## 2020-05-11 MED ORDER — DEXAMETHASONE SODIUM PHOSPHATE 10 MG/ML IJ SOLN
INTRAMUSCULAR | Status: DC | PRN
  Administered 2020-05-11: 6 mg via INTRAVENOUS

## 2020-05-11 MED ORDER — CHLORHEXIDINE GLUCONATE 4 % EX LIQD
60.0000 mL | Freq: Once | CUTANEOUS | Status: AC
  Administered 2020-05-11: 4 via TOPICAL

## 2020-05-11 MED ORDER — ACETAMINOPHEN 500 MG PO TABS
500.0000 mg | ORAL_TABLET | Freq: Four times a day (QID) | ORAL | Status: AC
  Administered 2020-05-11 – 2020-05-12 (×3): 500 mg via ORAL
  Filled 2020-05-11 (×4): qty 1

## 2020-05-11 MED ORDER — ONDANSETRON HCL 4 MG/2ML IJ SOLN: 4.0000 mg | Freq: Once | INTRAMUSCULAR | Status: DC | PRN

## 2020-05-11 MED ORDER — 0.9 % SODIUM CHLORIDE (POUR BTL) OPTIME
TOPICAL | Status: DC | PRN
  Administered 2020-05-11: 1000 mL

## 2020-05-11 MED ORDER — SUGAMMADEX SODIUM 200 MG/2ML IV SOLN
INTRAVENOUS | Status: DC | PRN
  Administered 2020-05-11: 150 mg via INTRAVENOUS

## 2020-05-11 MED ORDER — MAGNESIUM CITRATE PO SOLN: 1.0000 | Freq: Once | ORAL | Status: DC | PRN

## 2020-05-11 MED ORDER — POVIDONE-IODINE 10 % EX SWAB
2.0000 "application " | Freq: Once | CUTANEOUS | Status: AC
  Administered 2020-05-11: 2 via TOPICAL

## 2020-05-11 MED ORDER — SENNA 8.6 MG PO TABS
1.0000 | ORAL_TABLET | Freq: Two times a day (BID) | ORAL | Status: DC
  Administered 2020-05-11 – 2020-05-14 (×7): 8.6 mg via ORAL
  Filled 2020-05-11 (×6): qty 1

## 2020-05-11 MED ORDER — DEXAMETHASONE SODIUM PHOSPHATE 10 MG/ML IJ SOLN
INTRAMUSCULAR | Status: AC
  Filled 2020-05-11: qty 1

## 2020-05-11 MED ORDER — ACETAMINOPHEN 325 MG PO TABS
650.0000 mg | ORAL_TABLET | Freq: Four times a day (QID) | ORAL | Status: DC | PRN
  Administered 2020-05-12 – 2020-05-14 (×3): 650 mg via ORAL
  Filled 2020-05-11 (×3): qty 2

## 2020-05-11 MED ORDER — ATORVASTATIN CALCIUM 40 MG PO TABS
80.0000 mg | ORAL_TABLET | Freq: Every day | ORAL | Status: DC
  Administered 2020-05-12 – 2020-05-13 (×2): 80 mg via ORAL
  Filled 2020-05-11 (×2): qty 2

## 2020-05-11 MED ORDER — ACETAMINOPHEN 650 MG RE SUPP: 650.0000 mg | Freq: Four times a day (QID) | RECTAL | Status: DC | PRN

## 2020-05-11 MED ORDER — MORPHINE SULFATE (PF) 2 MG/ML IV SOLN
2.0000 mg | INTRAVENOUS | Status: DC | PRN
  Administered 2020-05-11: 1 mg via INTRAVENOUS
  Filled 2020-05-11: qty 1

## 2020-05-11 MED ORDER — PHENOL 1.4 % MT LIQD: 1.0000 | OROMUCOSAL | Status: DC | PRN

## 2020-05-11 MED ORDER — CHLORHEXIDINE GLUCONATE 0.12 % MT SOLN
15.0000 mL | OROMUCOSAL | Status: AC
  Administered 2020-05-11: 15 mL via OROMUCOSAL

## 2020-05-11 MED ORDER — ONDANSETRON HCL 4 MG/2ML IJ SOLN
INTRAMUSCULAR | Status: DC | PRN
  Administered 2020-05-11: 4 mg via INTRAVENOUS

## 2020-05-11 MED ORDER — ONDANSETRON HCL 4 MG/2ML IJ SOLN
INTRAMUSCULAR | Status: AC
  Filled 2020-05-11: qty 2

## 2020-05-11 MED ORDER — FENTANYL CITRATE (PF) 100 MCG/2ML IJ SOLN
INTRAMUSCULAR | Status: DC | PRN
  Administered 2020-05-11 (×2): 25 ug via INTRAVENOUS
  Administered 2020-05-11: 50 ug via INTRAVENOUS

## 2020-05-11 MED ORDER — CEFAZOLIN SODIUM-DEXTROSE 2-4 GM/100ML-% IV SOLN
2.0000 g | Freq: Four times a day (QID) | INTRAVENOUS | Status: AC
  Administered 2020-05-11 – 2020-05-12 (×2): 2 g via INTRAVENOUS
  Filled 2020-05-11 (×2): qty 100

## 2020-05-11 MED ORDER — ALBUMIN HUMAN 5 % IV SOLN: INTRAVENOUS | Status: DC | PRN

## 2020-05-11 MED ORDER — VITAMIN B-12 1000 MCG PO TABS
1000.0000 ug | ORAL_TABLET | Freq: Every day | ORAL | Status: DC
  Administered 2020-05-12 – 2020-05-14 (×4): 1000 ug via ORAL
  Filled 2020-05-11 (×5): qty 1

## 2020-05-11 MED ORDER — MIDODRINE HCL 2.5 MG PO TABS
2.5000 mg | ORAL_TABLET | Freq: Every day | ORAL | Status: DC
  Administered 2020-05-12 – 2020-05-13 (×2): 2.5 mg via ORAL
  Filled 2020-05-11 (×4): qty 1

## 2020-05-11 MED ORDER — PHENYLEPHRINE HCL-NACL 10-0.9 MG/250ML-% IV SOLN
INTRAVENOUS | Status: DC | PRN
  Administered 2020-05-11: 15 ug/min via INTRAVENOUS

## 2020-05-11 MED ORDER — OXYCODONE-ACETAMINOPHEN 5-325 MG PO TABS: 1.0000 | ORAL_TABLET | ORAL | Status: DC | PRN

## 2020-05-11 SURGICAL SUPPLY — 49 items
BIT DRILL 2.0X128 (BIT) ×2 IMPLANT
BLADE SAW SAG 73X25 THK (BLADE) ×1
BLADE SAW SGTL 73X25 THK (BLADE) ×1 IMPLANT
CLSR STERI-STRIP ANTIMIC 1/2X4 (GAUZE/BANDAGES/DRESSINGS) ×1 IMPLANT
COVER SURGICAL LIGHT HANDLE (MISCELLANEOUS) ×2 IMPLANT
COVER WAND RF STERILE (DRAPES) IMPLANT
DRAPE INCISE IOBAN 66X45 STRL (DRAPES) ×4 IMPLANT
DRAPE ORTHO SPLIT 77X108 STRL (DRAPES) ×4
DRAPE SURG ORHT 6 SPLT 77X108 (DRAPES) ×2 IMPLANT
DRAPE U-SHAPE 47X51 STRL (DRAPES) ×2 IMPLANT
DRSG MEPILEX BORDER 4X8 (GAUZE/BANDAGES/DRESSINGS) ×2 IMPLANT
DRSG MEPITEL 4X7.2 (GAUZE/BANDAGES/DRESSINGS) ×1 IMPLANT
DURAPREP 26ML APPLICATOR (WOUND CARE) ×2 IMPLANT
ELECT REM PT RETURN 15FT ADLT (MISCELLANEOUS) ×2 IMPLANT
GLOVE BIO SURGEON STRL SZ7 (GLOVE) ×2 IMPLANT
GLOVE BIOGEL PI IND STRL 7.0 (GLOVE) ×1 IMPLANT
GLOVE BIOGEL PI IND STRL 8 (GLOVE) ×1 IMPLANT
GLOVE BIOGEL PI INDICATOR 7.0 (GLOVE) ×1
GLOVE BIOGEL PI INDICATOR 8 (GLOVE) ×1
GLOVE ORTHO TXT STRL SZ7.5 (GLOVE) ×2 IMPLANT
GOWN STRL REUS W/TWL LRG LVL3 (GOWN DISPOSABLE) ×4 IMPLANT
HANDPIECE INTERPULSE COAX TIP (DISPOSABLE) ×2
HEAD FEM UNIPOLAR 44 OD STRL (Hips) ×1 IMPLANT
KIT BASIN OR (CUSTOM PROCEDURE TRAY) ×2 IMPLANT
KIT TURNOVER KIT A (KITS) IMPLANT
NS IRRIG 1000ML POUR BTL (IV SOLUTION) ×2 IMPLANT
PACK TOTAL JOINT (CUSTOM PROCEDURE TRAY) ×2 IMPLANT
PENCIL SMOKE EVACUATOR (MISCELLANEOUS) IMPLANT
PROTECTOR NERVE ULNAR (MISCELLANEOUS) ×2 IMPLANT
RETRIEVER SUT HEWSON (MISCELLANEOUS) ×2 IMPLANT
SET HNDPC FAN SPRY TIP SCT (DISPOSABLE) ×1 IMPLANT
SPACER FEM TAPERED +0 12/14 (Hips) ×1 IMPLANT
SPONGE LAP 4X18 RFD (DISPOSABLE) IMPLANT
STEM SUMMIT BASIC PRESSFIT SZ4 (Hips) ×1 IMPLANT
STRIP CLOSURE SKIN 1/4X4 (GAUZE/BANDAGES/DRESSINGS) ×4 IMPLANT
SUT ETHIBOND NAB CT1 #1 30IN (SUTURE) ×2 IMPLANT
SUT FIBERWIRE #2 38 REV NDL BL (SUTURE) ×8
SUT MNCRL AB 4-0 PS2 18 (SUTURE) IMPLANT
SUT VIC AB 0 CT1 36 (SUTURE) ×2 IMPLANT
SUT VIC AB 1 CT1 27 (SUTURE)
SUT VIC AB 1 CT1 27XBRD ANTBC (SUTURE) IMPLANT
SUT VIC AB 2-0 CT1 27 (SUTURE) ×4
SUT VIC AB 2-0 CT1 TAPERPNT 27 (SUTURE) ×2 IMPLANT
SUT VIC AB 3-0 SH 8-18 (SUTURE) ×2 IMPLANT
SUTURE FIBERWR#2 38 REV NDL BL (SUTURE) ×3 IMPLANT
TOWEL OR 17X26 10 PK STRL BLUE (TOWEL DISPOSABLE) ×6 IMPLANT
TOWER CARTRIDGE SMART MIX (DISPOSABLE) IMPLANT
TRAY FOLEY MTR SLVR 16FR STAT (SET/KITS/TRAYS/PACK) IMPLANT
WATER STERILE IRR 1000ML POUR (IV SOLUTION) ×2 IMPLANT

## 2020-05-11 NOTE — Transfer of Care (Signed)
Immediate Anesthesia Transfer of Care Note  Patient: Kristin Rush  Procedure(s) Performed: ARTHROPLASTY BIPOLAR HIP (HEMIARTHROPLASTY) (Left Hip)  Patient Location: PACU  Anesthesia Type:General  Level of Consciousness: awake and patient cooperative  Airway & Oxygen Therapy: Patient Spontanous Breathing and Patient connected to face mask oxygen  Post-op Assessment: Report given to RN and Post -op Vital signs reviewed and stable  Post vital signs: stable  Last Vitals:  Vitals Value Taken Time  BP 164/71 05/11/20 1754  Temp    Pulse 97 05/11/20 1759  Resp 15 05/11/20 1759  SpO2 100 % 05/11/20 1759  Vitals shown include unvalidated device data.  Last Pain:  Vitals:   05/11/20 1445  TempSrc: Oral         Complications: No complications documented.

## 2020-05-11 NOTE — Consult Note (Signed)
ORTHOPAEDIC CONSULTATION  REQUESTING PHYSICIAN: Burnadette Pop, MD  Chief Complaint: left hip pain  HPI: Kristin Rush is a 79 y.o. female with history of migraines, recent stroke (hospitalized 8/18-8/25), CHF, and orthostatic hypotension who complains of constant left hip pain after fall. She states she fell while trying to get out of bed yesterday and fell directly onto her left side. Denies pain anywhere else. States pain is worse with movement and better with rest. Denies chest pain, shortness of breath, loss of consciousness, or paraesthesias. Patient does not remember when last dose of Plavix was taken.   Past Medical History:  Diagnosis Date  . 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 Lumbar spondylosis 02/18/2018  . Migraine   . Migraines 10/09/2011  . Orthostatic hypotension 10/23/2016  . Osteoporosis 04/16/2014  . Primary osteoarthritis of both hands 07/11/2017   History reviewed. No pertinent surgical history. Social History   Socioeconomic History  . Marital status: Married    Spouse name: Not on file  . Number of children: Not on file  . Years of education: Not on file  . Highest education level: Not on file  Occupational History  . Not on file  Tobacco Use  . Smoking status: Never Smoker  . Smokeless tobacco: Never Used  Substance and Sexual Activity  . Alcohol use: No  . Drug use: No  . Sexual activity: Not on file  Other Topics Concern  . Not on file  Social History Narrative  . Not on file   Social Determinants of Health   Financial Resource Strain:   . Difficulty of Paying Living Expenses: Not on file  Food Insecurity:   . Worried About Programme researcher, broadcasting/film/video in the Last Year: Not on file  . Ran Out of Food in the Last Year: Not on file  Transportation Needs:   . Lack of Transportation (Medical): Not on file  . Lack of Transportation (Non-Medical): Not on file  Physical Activity:   . Days of Exercise  per Week: Not on file  . Minutes of Exercise per Session: Not on file  Stress:   . Feeling of Stress : Not on file  Social Connections:   . Frequency of Communication with Friends and Family: Not on file  . Frequency of Social Gatherings with Friends and Family: Not on file  . Attends Religious Services: Not on file  . Active Member of Clubs or Organizations: Not on file  . Attends Banker Meetings: Not on file  . Marital Status: Not on file   Family History  Problem Relation Age of Onset  . Heart disease Neg Hx    Allergies  Allergen Reactions  . Codeine Nausea And Vomiting  . Sulfa Antibiotics Other (See Comments)    unk    Positive ROS: All other systems have been reviewed and were otherwise negative with the exception of those mentioned in the HPI and as above.  Physical Exam: General: Alert,laying comfortably in bed, in no acute distress Cardiovascular: No pedal edema Respiratory: No cyanosis, no use of accessory musculature GI: No organomegaly, abdomen is soft and non-tender Skin: No lesions seen on left lower extremity Neurologic: Sensation intact distally Psychiatric: Patient is competent for consent with normal mood and affect Lymphatic: No axillary or cervical lymphadenopathy  MUSCULOSKELETAL: Left leg shortened, severe pain with internal and external rotation at left hip. +PT pulse, + DP pulse. Distal sensation intact. EHL and FHL intact. Unable to  move at left knee due to pain.   Assessment/Plan: Left femoral neck fracture - plan for left hip hemiarthroplasty with Dr. Dion Saucier this afternoon, keep NPO in preparation for surgery - Will plan to hold plavix dose today but restart directly after surgery  Armida Sans, PA-C   05/11/2020 7:14 AM

## 2020-05-11 NOTE — Anesthesia Procedure Notes (Signed)
Procedure Name: Intubation Date/Time: 05/11/2020 3:38 PM Performed by: Lissa Morales, CRNA Pre-anesthesia Checklist: Patient identified, Emergency Drugs available, Suction available and Patient being monitored Patient Re-evaluated:Patient Re-evaluated prior to induction Oxygen Delivery Method: Circle system utilized Preoxygenation: Pre-oxygenation with 100% oxygen Induction Type: IV induction Ventilation: Mask ventilation without difficulty Laryngoscope Size: Mac and 4 Grade View: Grade II Tube type: Oral Number of attempts: 1 Airway Equipment and Method: Stylet and Oral airway Placement Confirmation: ETT inserted through vocal cords under direct vision,  positive ETCO2 and breath sounds checked- equal and bilateral Secured at: 21 cm Tube secured with: Tape Dental Injury: Teeth and Oropharynx as per pre-operative assessment  Comments: Larynx slightly anterior, able to visualize posterior arytenoids

## 2020-05-11 NOTE — Progress Notes (Signed)
Patient is a 79 year old female with history of recent nonhemorrhagic stroke and was hospitalized from 8/18-8/25, 30 hypertension, chronic diastolic congestive heart failure who presents to the emergency department with complaints of left hip pain after she fell.  As per the report, she was sitting on the side of the bed when she suddenly slipped off her bed and fell on her left hip.  There was started onset of acute pain and she was brought to the emergency department.There was no report of loss of consciousness.  Imaging done in the emergency department showed left femoral neck fracture.  Orthopedics was consulted and she is undergoing ORIF today. Patient seen and examined at the bedside this morning in the emergency department.  Currently she is hemodynamically stable.  Pain is well controlled.  She has history of chronic diastolic congestive heart failure but currently she is euvolemic.  Blood pressure is stable.  She was recently managed for nonhemorrhagic stroke for which she was taking aspirin and Plavix at home.  Examination revealed externally rotated left lower extremity, tenderness on the left hip.  Other examination findings were unremarkable. Patient seen and examined by Dr. Greig Right this morning.  I agree with assessment and plan. We will order DVT prophylaxis and PT/OT evaluation after the surgery. I called and discussed with her sister Elita Quick on phone today.

## 2020-05-11 NOTE — ED Provider Notes (Signed)
Labs notable for PLT 146, otherwise essentially normal.  Discussed with Dr. Leafy Half, who is appreciated for admitting.  Dr. Dion Saucier to consult in AM.   Roxy Horseman, PA-C 05/11/20 0370    Wilkie Aye Mayer Masker, MD 05/12/20 0430

## 2020-05-11 NOTE — Anesthesia Preprocedure Evaluation (Addendum)
Anesthesia Evaluation  Patient identified by MRN, date of birth, ID band Patient awake    Reviewed: Allergy & Precautions, NPO status , Patient's Chart, lab work & pertinent test results  Airway      Mouth opening: Limited Mouth Opening Comment: Pt not cooperative with airway exam  Dental  (+) Teeth Intact   Pulmonary neg pulmonary ROS,    Pulmonary exam normal breath sounds clear to auscultation       Cardiovascular Exercise Tolerance: Poor +CHF (grade 2 diastolic dysfunction)  Normal cardiovascular exam Rhythm:Regular Rate:Normal  Orthostatic hypotension- on midodrine  03/17/20:  1. Left ventricular ejection fraction, by estimation, is 60 to 65%. The left ventricle has normal function. The left ventricle has no regional wall motion abnormalities. Left ventricular diastolic parameters are consistent with Grade II diastolic dysfunction (pseudonormalization). Elevated left ventricular end-diastolic pressure.  2. Right ventricular systolic function is normal. The right ventricular size is normal.  3. The mitral valve is normal in structure. Trivial mitral valve regurgitation. No evidence of mitral stenosis.  4. The aortic valve is normal in structure. Aortic valve regurgitation is not visualized. No aortic stenosis is present.  5. The inferior vena cava is normal in size with greater than 50% respiratory variability, suggesting right atrial pressure of 3 mmHg.    Neuro/Psych  Headaches, PSYCHIATRIC DISORDERS Anxiety Depression recent stroke (right corona radiata infarct) hospitalized from 8/18 to 05/05/2020 Chronic hydrocephalus  Per sister, had had poor functional status since recent stroke- not ambulating, only oriented to herself, not to place or time. CVA (21-day course of dual antiplatelet therapy with aspirin and Plavix since her recent stroke ), Residual Symptoms    GI/Hepatic negative GI ROS, Neg liver ROS,   Endo/Other   negative endocrine ROS  Renal/GU negative Renal ROS  negative genitourinary   Musculoskeletal  (+) Arthritis , Osteoarthritis,  Left hip fx Lumbar spondylosis    Abdominal   Peds  Hematology negative hematology ROS (+) hct 40, plt 146   Anesthesia Other Findings HOH Last dose plavix 8/30  Reproductive/Obstetrics negative OB ROS                            Anesthesia Physical Anesthesia Plan  ASA: IV  Anesthesia Plan: General   Post-op Pain Management:    Induction: Intravenous and Rapid sequence  PONV Risk Score and Plan: 3 and Ondansetron, Dexamethasone and Treatment may vary due to age or medical condition  Airway Management Planned: Oral ETT  Additional Equipment: None  Intra-op Plan:   Post-operative Plan: Extubation in OR and Possible Post-op intubation/ventilation  Informed Consent: I have reviewed the patients History and Physical, chart, labs and discussed the procedure including the risks, benefits and alternatives for the proposed anesthesia with the patient or authorized representative who has indicated his/her understanding and acceptance.     Dental advisory given and Consent reviewed with POA  Plan Discussed with: CRNA  Anesthesia Plan Comments: (D/w sister Doyne Keel the possibility of post-operative cognitive dysfunction, postop ventilation and need for pressors intraop/postop as well as the likelihood of needing blood products d/t recent plavix use.)       Anesthesia Quick Evaluation

## 2020-05-11 NOTE — Op Note (Signed)
05/10/2020 - 05/11/2020  5:20 PM  PATIENT:  Kristin Rush   MRN: [id number]  PRE-OPERATIVE DIAGNOSIS: Displaced left femoral neck fracture  POST-OPERATIVE DIAGNOSIS: Same  PROCEDURE:  Procedure(s): Left hip hemiarthroplasty  PREOPERATIVE INDICATIONS:  Kristin Rush is an 79 y.o. female who was admitted 05/10/2020 with a diagnosis of Fracture of femoral neck, left ([place]) and elected for surgical management.  The risks benefits and alternatives were discussed with the patient including but not limited to the risks of nonoperative treatment, versus surgical intervention including infection, bleeding, nerve injury, periprosthetic fracture, the need for revision surgery, dislocation, leg length discrepancy, blood clots, cardiopulmonary complications, morbidity, mortality, among others, and they were willing to proceed.  Predicted outcome is good, although there will be at least a six to nine month expected recovery.  She had a stroke a month ago, which affected her left-sided function as well.  OPERATIVE REPORT     SURGEON:  Marchia Bond, MD    ASSISTANT:  Merlene Pulling, PA-C,   (Present throughout the entire procedure,  necessary for completion of procedure in a timely manner, assisting with retraction, instrumentation, and closure)     ANESTHESIA: General  ESTIMATED BLOOD LOSS: 381 mL    COMPLICATIONS:  None.   UNIQUE ASPECTS OF THE CASE: She was difficult to reduce, and I ended up trying with a minus, but ultimately after removing the trial, the broach sat down further, and her leg lengths felt short with the minus, and so I ended up using a size 4 femur with a +0 neck length.  She was extremely stable.  The prosthesis was rotationally stable as well, although the trial had just a small amount of wiggle.  The reduction was slightly difficult, even though clinically she felt slightly short, ultimately I went with the most stable construct that restored length as closely as possible.      COMPONENTS:  Chemical engineer Femoral Fracture stem size 4, with a +0 spacer and a size 44 fracture head unipolar hip ball.    PROCEDURE IN DETAIL: The patient was met in the holding area and identified.  The appropriate hip  was marked at the operative site. The patient was then transported to the OR and  placed under anesthesia.  At that point, the patient was  placed in the lateral decubitus position with the operative side up and  secured to the operating room table and all bony prominences padded.     The operative lower extremity was prepped from the iliac crest to the toes.  Sterile draping was performed.  Time out was performed prior to incision.      A routine posterolateral approach was utilized via sharp dissection  carried down to the subcutaneous tissue.  Gross bleeders were Bovie  coagulated.  The iliotibial band was identified and incised  along the length of the skin incision.  Self-retaining retractors were  inserted.  With the hip internally rotated, the short external rotators  were identified. The piriformis was tagged with FiberWire, and the hip capsule released in a T-type fashion.  The femoral neck was exposed, and I resected the femoral neck using the appropriate jig. This was performed at approximately a thumb's breadth above the lesser trochanter.    I then exposed the deep acetabulum, cleared out any tissue including the ligamentum teres, and included the hip capsule in the FiberWire used above and below the T.    I then prepared the proximal femur using the  cookie-cutter, the lateralizing reamer, and then sequentially broached.  A trial utilized, and I reduced the hip and it was found to have excellent stability with functional range of motion.  Initially I tried with a size 4 femur, +0, but could not get that reduced, then went to the minus, was able to get that reduced, but the 4 was slightly rotationally unstable after dislocation, although I was able to  advance the prosthesis, then I went back to a +0 because the minus felt a little short, and I was able to reduce the +0, and ultimately this was the construct that I selected.  The trial components were then removed.   I then place the real implant and I impacted the real head ball into place. The hip was then reduced and taken through functional range of motion and found to have excellent stability. Leg lengths were restored.  I then used a 2 mm drill bits to pass the FiberWire suture from the capsule and piriformis through the greater trochanter, and secured this. Excellent posterior capsular repair was achieved. I also closed the T in the capsule.  I then irrigated the hip copiously again with pulse lavage, and repaired the fascia with Vicryl, followed by Vicryl for the subcutaneous tissue, Monocryl for the skin, Steri-Strips and sterile gauze. The wounds were injected. The patient was then awakened and returned to PACU in stable and satisfactory condition. There were no complications.  Marchia Bond, MD Orthopedic Surgeon [phone]   05/11/2020 5:20 PM

## 2020-05-11 NOTE — ED Notes (Signed)
Pink top sent to lab for ABO/Rh

## 2020-05-11 NOTE — H&P (Signed)
History and Physical    Kristin Rush NWG:[id number] DOB: [birth year] DOA: 05/10/2020  PCP: Eartha Inch, MD  Patient coming from: Home   Chief Complaint:  Chief Complaint  Patient presents with  . Fall     HPI:    79 year old female with past medical history of recent stroke (right corona radiata infarct) hospitalized from 8/18 to 05/05/2020, orthostatic hypotension, chronic diastolic congestive heart failure (EF 60-65% 03/2020), chronic hydrocephalus who presents to [facility] emergency department with left hip pain status post fall.  Patient explains that yesterday evening she was sitting on the side of the bed when she suddenly slipped off of her bed, falling on her left hip.  This resulted in sudden onset of left hip pain, severe in intensity, sharp in quality, nonradiating.  Pain is worse with movement of the affected extremity and improved with rest.  Family came to the patient's aid although none of them witnessed the fall.  Both patient and family deny that the patient lost consciousness during this event.  Patient was brought in to [facility] emergency department for evaluation.  Upon evaluation in the emergency department, trauma survey of the cervical spine and head revealed no acute disease.  X-ray of the pelvis did reveal evidence of a left femoral neck fracture however.  Case was discussed with Dr. Dion Saucier who recommended that the patient be kept n.p.o. in preparation for possible surgical intervention on 8/31.   Review of Systems:   Review of Systems  Musculoskeletal: Positive for falls.       Hip pain   All other systems reviewed and are negative.     Past Medical History:  Diagnosis Date  . 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 Lumbar spondylosis 02/18/2018  . Migraine   . Migraines 10/09/2011  . Orthostatic hypotension 10/23/2016  . Osteoporosis 04/16/2014  . Primary osteoarthritis of  both hands 07/11/2017    History reviewed. No pertinent surgical history.   reports that she has never smoked. She has never used smokeless tobacco. She reports that she does not drink alcohol and does not use drugs.  Allergies  Allergen Reactions  . Codeine Nausea And Vomiting  . Sulfa Antibiotics Other (See Comments)    unk    Family History  Problem Relation Age of Onset  . Heart disease Neg Hx      Prior to Admission medications   Medication Sig Start Date End Date Taking? Authorizing Provider  acetaminophen (TYLENOL) 500 MG tablet Take 500 mg by mouth daily as needed for mild pain.    Yes [provider]  ALPRAZolam (XANAX) 0.25 MG tablet Take 1 tablet (0.25 mg total) by mouth 2 (two) times daily as needed for anxiety. 05/05/20  Yes Joseph Art, DO  atorvastatin (LIPITOR) 80 MG tablet Take 1 tablet (80 mg total) by mouth daily at 6 PM. 05/05/20  Yes Vann, Jessica U, DO  calcium-vitamin D (OSCAL WITH D) 500-200 MG-UNIT per tablet Take 1 tablet by mouth daily.   Yes [provider]  clopidogrel (PLAVIX) 75 MG tablet Take 1 tablet (75 mg total) by mouth daily. 05/06/20  Yes Marlin Canary U, DO  cyanocobalamin 1000 MCG tablet Take 1,000 mcg by mouth daily. 04/19/18  Yes [provider]  escitalopram (LEXAPRO) 10 MG tablet Take 10 mg by mouth daily.   Yes [provider]  Maltodextrin-Xanthan Gum (RESOURCE THICKENUP CLEAR) POWD To make liquids honey thick 05/05/20  Yes Marlin Canary U, DO  midodrine (PROAMATINE) 2.5 MG tablet Take 1 tablet (2.5 mg total) by mouth at bedtime. 05/05/20  Yes Marlin Canary U, DO  midodrine (PROAMATINE) 5 MG tablet Take 1 tablet (5 mg total) by mouth 2 (two) times daily with a meal. 05/05/20  Yes Vann, Jessica U, DO  Multiple Vitamins-Minerals (PRESERVISION AREDS) TABS Take 1 tablet by mouth daily.   Yes [provider]  polyethylene glycol (MIRALAX / GLYCOLAX) 17 g packet Take 17 g by mouth daily as needed for  mild constipation.   Yes [provider]  aspirin EC 81 MG EC tablet Take 1 tablet (81 mg total) by mouth daily. Swallow whole. 05/06/20   Joseph Art, DO    Physical Exam: Vitals:   05/11/20 0332 05/11/20 0400 05/11/20 0500 05/11/20 0600  BP: 134/66 (!) 145/68 (!) 153/68 (!) 142/64  Pulse: 94 90 95 95  Resp: 18 20 20 20   Temp: 98.1 F (36.7 C)     TempSrc:      SpO2: 95% 93% 94% 94%    Constitutional: Acute alert and oriented x3, patient is in distress due to pain. Skin: no rashes, no lesions, good skin turgor noted. Eyes: Pupils are equally reactive to light.  No evidence of scleral icterus or conjunctival pallor.  ENMT: Slightly dry mucous membranes noted.  Posterior pharynx clear of any exudate or lesions.   Neck: normal, supple, no masses, no thyromegaly.  No evidence of jugular venous distension.   Respiratory: clear to auscultation bilaterally, no wheezing, no crackles. Normal respiratory effort. No accessory muscle use.  Cardiovascular: Regular rate and rhythm, no murmurs / rubs / gallops. No extremity edema. 2+ pedal pulses. No carotid bruits.  Chest:   Nontender without crepitus or deformity.   Back:   Nontender without crepitus or deformity. Abdomen: Abdomen is soft and nontender.  No evidence of intra-abdominal masses.  Positive bowel sounds noted in all quadrants.   Musculoskeletal: Significant tenderness of the left hip with exquisite pain with both passive and active range of motion of the left hip.  Normal muscle tone.  No evidence of contractures.   Neurologic: CN 2-12 grossly intact. Sensation intact, unable to assess strength of the left lower extremity due to pain.  Patient is moving other extremities normally.  Patient is following all commands.  Patient is responsive to verbal stimuli.   Psychiatric: Normal mood with flat affect.  Patient seems to possess insight as to theircurrent situation.     Labs on Admission: I have personally reviewed following  labs and imaging studies -   CBC: Recent Labs  Lab 05/10/20 2340  WBC 9.2  NEUTROABS 7.7  HGB 13.0  HCT 40.0  MCV 93.7  PLT 146*   Basic Metabolic Panel: Recent Labs  Lab 05/05/20 0904 05/10/20 2340  NA  --  139  K  --  4.3  CL  --  102  CO2  --  26  GLUCOSE  --  103*  BUN  --  14  CREATININE 0.90 0.91  CALCIUM  --  9.2   GFR: Estimated Creatinine Clearance: 42.8 mL/min (by C-G formula based on SCr of 0.91 mg/dL). Liver Function Tests: No results for input(s): AST, ALT, ALKPHOS, BILITOT, PROT, ALBUMIN in the last 168 hours. No results for input(s): LIPASE, AMYLASE in the last 168 hours. No results for input(s): AMMONIA in the last 168 hours. Coagulation Profile: Recent Labs  Lab 05/10/20 2340  INR 1.0  Cardiac Enzymes: No results for input(s): CKTOTAL, CKMB, CKMBINDEX, TROPONINI in the last 168 hours. BNP (last 3 results) No results for input(s): PROBNP in the last 8760 hours. HbA1C: No results for input(s): HGBA1C in the last 72 hours. CBG: No results for input(s): GLUCAP in the last 168 hours. Lipid Profile: No results for input(s): CHOL, HDL, LDLCALC, TRIG, CHOLHDL, LDLDIRECT in the last 72 hours. Thyroid Function Tests: No results for input(s): TSH, T4TOTAL, FREET4, T3FREE, THYROIDAB in the last 72 hours. Anemia Panel: No results for input(s): VITAMINB12, FOLATE, FERRITIN, TIBC, IRON, RETICCTPCT in the last 72 hours. Urine analysis:    Component Value Date/Time   COLORURINE STRAW (A) 04/28/2020 1649   APPEARANCEUR CLEAR 04/28/2020 1649   LABSPEC 1.021 04/28/2020 1649   PHURINE 8.0 04/28/2020 1649   GLUCOSEU NEGATIVE 04/28/2020 1649   HGBUR NEGATIVE 04/28/2020 1649   BILIRUBINUR NEGATIVE 04/28/2020 1649   KETONESUR NEGATIVE 04/28/2020 1649   PROTEINUR NEGATIVE 04/28/2020 1649   UROBILINOGEN 0.2 10/03/2011 1002   NITRITE NEGATIVE 04/28/2020 1649   LEUKOCYTESUR NEGATIVE 04/28/2020 1649    Radiological Exams on Admission - Personally  Reviewed: DG Chest 1 View  Result Date: 05/10/2020 CLINICAL DATA:  Larey Seat out of bed, left hip fracture EXAM: CHEST  1 VIEW COMPARISON:  None. FINDINGS: Single frontal view of the chest demonstrates an unremarkable cardiac silhouette. No airspace disease, effusion, or pneumothorax. Calcified pleural plaque at the left apex. No acute bony abnormalities. IMPRESSION: 1. No acute intrathoracic process. Electronically Signed   By: Sharlet Salina M.D.   On: 05/10/2020 21:54   CT Head Wo Contrast  Result Date: 05/10/2020 CLINICAL DATA:  Fall EXAM: CT HEAD WITHOUT CONTRAST CT CERVICAL SPINE WITHOUT CONTRAST TECHNIQUE: Multidetector CT imaging of the head and cervical spine was performed following the standard protocol without intravenous contrast. Multiplanar CT image reconstructions of the cervical spine were also generated. COMPARISON:  CT, CT angiography head and neck, MRI head 04/28/2020 FINDINGS: CT HEAD FINDINGS Brain: Images are motion degraded despite multiple attempts at acquisition, may limit detection of subtle anomaly. Of all vein area of encephalomalacia in the right corona radiata extending into the white matter tracts compatible with an area of infarct seen on recent MRI. Additional bilateral lacunar type infarcts in the basal ganglia. Streak artifact across the temporal lobes and pons/medulla. No convincing evidence of acute infarction, hemorrhage, hydrocephalus, extra-axial collection or mass lesion/mass effect. Symmetric prominence of the ventricles, cisterns and sulci compatible with parenchymal volume loss. Patchy areas of white matter hypoattenuation are most compatible with chronic microvascular angiopathy. Vascular: Atherosclerotic calcification of the carotid siphons and intradural vertebral arteries. No hyperdense vessel. Skull: No significant scalp swelling or hematoma. No calvarial fracture or worrisome osseous lesions. Stable benign sessile osteoma of the frontal bone (4/23). Sinuses/Orbits:  Paranasal sinuses and mastoid air cells are predominantly clear. Included orbital structures are unremarkable. Other: None. CT CERVICAL SPINE FINDINGS Alignment: Stabilization collar is absent at the time of examination. There is preservation of normal cervical lordosis. Some mild retrolisthesis C4 on C5 is unchanged from comparison. No evidence of traumatic listhesis. No abnormally widened, perched or jumped facets. Normal alignment of the craniocervical and atlantoaxial articulations. Skull base and vertebrae: No acute skull base fracture. No vertebral body fracture or height loss. Normal bone mineralization. No worrisome osseous lesions. Small vertebral body hemangioma at C7 is unchanged from comparison. Arthrosis at the atlantodental and basion dens interval with some hypertrophic spurring and degenerative ossifications. Multilevel spondylitic changes as detailed below. Soft tissues  and spinal canal: No pre or paravertebral fluid or swelling. No visible canal hematoma. Disc levels: Multilevel intervertebral disc height loss with spondylitic endplate changes. Multilevel disc osteophyte complexes are present with some mild multilevel spinal canal stenosis C3-C6 and more moderate central canal stenosis C6-7. Multilevel uncinate spurring and facet hypertrophic changes as well resulting in mild-to-moderate multilevel foraminal narrowing with more moderate to severe narrowing C4-5 and C5-6 bilaterally. Upper chest: No acute abnormality in the upper chest or imaged lung apices. Calcifications of the cervical carotids and proximal great vessels. Other: Normal thyroid gland. IMPRESSION: 1. Images are motion degraded despite multiple attempts at acquisition, may limit detection of subtle anomaly. 2. No convincing evidence of acute intracranial abnormality. No significant scalp swelling or calvarial fracture. 3. Chronic microvascular angiopathy and parenchymal volume loss. 4. Expected evolution of encephalomalacia in the  right corona radiata extending into the white matter tracts corresponding to the infarct seen on comparison MRI. 5. Remote lacunar type infarcts in the bilateral basal ganglia. 6. No evidence of acute fracture or traumatic listhesis of the cervical spine. 7. Multilevel degenerative changes of the cervical spine as described above. 8. Intracranial and cervical atherosclerosis. Electronically Signed   By: Kreg ShropshirePrice  DeHay M.D.   On: 05/10/2020 22:22   CT Cervical Spine Wo Contrast  Result Date: 05/10/2020 CLINICAL DATA:  Fall EXAM: CT HEAD WITHOUT CONTRAST CT CERVICAL SPINE WITHOUT CONTRAST TECHNIQUE: Multidetector CT imaging of the head and cervical spine was performed following the standard protocol without intravenous contrast. Multiplanar CT image reconstructions of the cervical spine were also generated. COMPARISON:  CT, CT angiography head and neck, MRI head 04/28/2020 FINDINGS: CT HEAD FINDINGS Brain: Images are motion degraded despite multiple attempts at acquisition, may limit detection of subtle anomaly. Of all vein area of encephalomalacia in the right corona radiata extending into the white matter tracts compatible with an area of infarct seen on recent MRI. Additional bilateral lacunar type infarcts in the basal ganglia. Streak artifact across the temporal lobes and pons/medulla. No convincing evidence of acute infarction, hemorrhage, hydrocephalus, extra-axial collection or mass lesion/mass effect. Symmetric prominence of the ventricles, cisterns and sulci compatible with parenchymal volume loss. Patchy areas of white matter hypoattenuation are most compatible with chronic microvascular angiopathy. Vascular: Atherosclerotic calcification of the carotid siphons and intradural vertebral arteries. No hyperdense vessel. Skull: No significant scalp swelling or hematoma. No calvarial fracture or worrisome osseous lesions. Stable benign sessile osteoma of the frontal bone (4/23). Sinuses/Orbits: Paranasal  sinuses and mastoid air cells are predominantly clear. Included orbital structures are unremarkable. Other: None. CT CERVICAL SPINE FINDINGS Alignment: Stabilization collar is absent at the time of examination. There is preservation of normal cervical lordosis. Some mild retrolisthesis C4 on C5 is unchanged from comparison. No evidence of traumatic listhesis. No abnormally widened, perched or jumped facets. Normal alignment of the craniocervical and atlantoaxial articulations. Skull base and vertebrae: No acute skull base fracture. No vertebral body fracture or height loss. Normal bone mineralization. No worrisome osseous lesions. Small vertebral body hemangioma at C7 is unchanged from comparison. Arthrosis at the atlantodental and basion dens interval with some hypertrophic spurring and degenerative ossifications. Multilevel spondylitic changes as detailed below. Soft tissues and spinal canal: No pre or paravertebral fluid or swelling. No visible canal hematoma. Disc levels: Multilevel intervertebral disc height loss with spondylitic endplate changes. Multilevel disc osteophyte complexes are present with some mild multilevel spinal canal stenosis C3-C6 and more moderate central canal stenosis C6-7. Multilevel uncinate spurring and facet hypertrophic changes  as well resulting in mild-to-moderate multilevel foraminal narrowing with more moderate to severe narrowing C4-5 and C5-6 bilaterally. Upper chest: No acute abnormality in the upper chest or imaged lung apices. Calcifications of the cervical carotids and proximal great vessels. Other: Normal thyroid gland. IMPRESSION: 1. Images are motion degraded despite multiple attempts at acquisition, may limit detection of subtle anomaly. 2. No convincing evidence of acute intracranial abnormality. No significant scalp swelling or calvarial fracture. 3. Chronic microvascular angiopathy and parenchymal volume loss. 4. Expected evolution of encephalomalacia in the right  corona radiata extending into the white matter tracts corresponding to the infarct seen on comparison MRI. 5. Remote lacunar type infarcts in the bilateral basal ganglia. 6. No evidence of acute fracture or traumatic listhesis of the cervical spine. 7. Multilevel degenerative changes of the cervical spine as described above. 8. Intracranial and cervical atherosclerosis. Electronically Signed   By: Kreg Shropshire M.D.   On: 05/10/2020 22:22   DG Hip Unilat With Pelvis 2-3 Views Left  Result Date: 05/10/2020 CLINICAL DATA:  Larey Seat out of bed, left leg deformity EXAM: DG HIP (WITH OR WITHOUT PELVIS) 2-3V LEFT COMPARISON:  None. FINDINGS: Frontal view of the pelvis as well as frontal and cross-table lateral views of the left hip are obtained. There is a subcapital left femoral neck fracture with impaction and varus angulation at the fracture site. No dislocation. The remainder of the bony pelvis is unremarkable. IMPRESSION: 1. Subcapital left femoral neck fracture as above. Electronically Signed   By: Sharlet Salina M.D.   On: 05/10/2020 21:54    EKG: Personally reviewed.  Rhythm is normal sinus rhythm with heart rate of 85 bpm.  No dynamic ST segment changes appreciated.  Assessment/Plan Principal Problem:   Fracture of femoral neck, left (HCC)   Patient has suffered traumatic fracture of the left femoral neck status post fall  Patient is currently n.p.o. at the recommendation of Dr. Dion Saucier  Providing patient with as needed opiate-based analgesics for substantial pain  Currently hydrating patient gently with intravenous lactated Ringer solution  Active Problems:   Fall at home, initial encounter   Patient denies loss of consciousness.  Family denies observing any seizure-like activity or postictal state.  I do not believe patient has suffered from syncope or seizure.  This is likely simply a mechanical fall off her bed which is an unfortunate turn of events after this woman suffered a stroke  just last week.    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  Will obtain PT evaluation postoperatively.   Code Status:  Full code Family Communication: deferred   Status is: Inpatient  Remains inpatient appropriate because:Inpatient level of care appropriate due to severity of illness   Dispo: The patient is from: Home              Anticipated d/c is to: Home              Anticipated d/c date is: > 3  days              Patient currently is not medically stable to d/c.        Marinda Elk MD [facility] Pager [phone]  If 7PM-7AM, please contact night-coverage [email or web address] Use universal [place] password for that web site. If you do not have the password, please call the hospital operator.  05/11/2020, 7:03 AM

## 2020-05-11 NOTE — Discharge Instructions (Signed)
INSTRUCTIONS AFTER JOINT REPLACEMENT   o Remove items at home which could result in a fall. This includes throw rugs or furniture in walking pathways o ICE to the affected joint every three hours while awake for 30 minutes at a time, for at least the first 3-5 days, and then as needed for pain and swelling.  Continue to use ice for pain and swelling. You may notice swelling that will progress down to the foot and ankle.  This is normal after surgery.  Elevate your leg when you are not up walking on it.   o Continue to use the breathing machine you got in the hospital (incentive spirometer) which will help keep your temperature down.  It is common for your temperature to cycle up and down following surgery, especially at night when you are not up moving around and exerting yourself.  The breathing machine keeps your lungs expanded and your temperature down.   DIET:  As you were doing prior to hospitalization, we recommend a well-balanced diet.  DRESSING / WOUND CARE / SHOWERING  You may change your dressing 3-5 days after surgery.  Then change the dressing every day with sterile gauze.  Please use good hand washing techniques before changing the dressing.  Do not use any lotions or creams on the incision until instructed by your surgeon.  ACTIVITY  o Increase activity slowly as tolerated, but follow the weight bearing instructions below.   o No driving for 6 weeks or until further direction given by your physician.  You cannot drive while taking narcotics.  o No lifting or carrying greater than 10 lbs. until further directed by your surgeon. o Avoid periods of inactivity such as sitting longer than an hour when not asleep. This helps prevent blood clots.  o You may return to work once you are authorized by your doctor.     WEIGHT BEARING   Weight bearing as tolerated with assist device (walker, cane, etc) as directed, use it as long as suggested by your surgeon or therapist, typically at  least 4-6 weeks.   EXERCISES  Results after joint replacement surgery are often greatly improved when you follow the exercise, range of motion and muscle strengthening exercises prescribed by your doctor. Safety measures are also important to protect the joint from further injury. Any time any of these exercises cause you to have increased pain or swelling, decrease what you are doing until you are comfortable again and then slowly increase them. If you have problems or questions, call your caregiver or physical therapist for advice.   Rehabilitation is important following a joint replacement. After just a few days of immobilization, the muscles of the leg can become weakened and shrink (atrophy).  These exercises are designed to build up the tone and strength of the thigh and leg muscles and to improve motion. Often times heat used for twenty to thirty minutes before working out will loosen up your tissues and help with improving the range of motion but do not use heat for the first two weeks following surgery (sometimes heat can increase post-operative swelling).   These exercises can be done on a training (exercise) mat, on the floor, on a table or on a bed. Use whatever works the best and is most comfortable for you.    Use music or television while you are exercising so that the exercises are a pleasant break in your day. This will make your life better with the exercises acting as a break   in your routine that you can look forward to.   Perform all exercises about fifteen times, three times per day or as directed.  You should exercise both the operative leg and the other leg as well.  Exercises include:   . Quad Sets - Tighten up the muscle on the front of the thigh (Quad) and hold for 5-10 seconds.   . Straight Leg Raises - With your knee straight (if you were given a brace, keep it on), lift the leg to 60 degrees, hold for 3 seconds, and slowly lower the leg.  Perform this exercise against  resistance later as your leg gets stronger.  . Leg Slides: Lying on your back, slowly slide your foot toward your buttocks, bending your knee up off the floor (only go as far as is comfortable). Then slowly slide your foot back down until your leg is flat on the floor again.  . Angel Wings: Lying on your back spread your legs to the side as far apart as you can without causing discomfort.  . Hamstring Strength:  Lying on your back, push your heel against the floor with your leg straight by tightening up the muscles of your buttocks.  Repeat, but this time bend your knee to a comfortable angle, and push your heel against the floor.  You may put a pillow under the heel to make it more comfortable if necessary.   A rehabilitation program following joint replacement surgery can speed recovery and prevent re-injury in the future due to weakened muscles. Contact your doctor or a physical therapist for more information on knee rehabilitation.    CONSTIPATION  Constipation is defined medically as fewer than three stools per week and severe constipation as less than one stool per week.  Even if you have a regular bowel pattern at home, your normal regimen is likely to be disrupted due to multiple reasons following surgery.  Combination of anesthesia, postoperative narcotics, change in appetite and fluid intake all can affect your bowels.   YOU MUST use at least one of the following options; they are listed in order of increasing strength to get the job done.  They are all available over the counter, and you may need to use some, POSSIBLY even all of these options:    Drink plenty of fluids (prune juice may be helpful) and high fiber foods Colace 100 mg by mouth twice a day  Senokot for constipation as directed and as needed Dulcolax (bisacodyl), take with full glass of water  Miralax (polyethylene glycol) once or twice a day as needed.  If you have tried all these things and are unable to have a bowel  movement in the first 3-4 days after surgery call either your surgeon or your primary doctor.    If you experience loose stools or diarrhea, hold the medications until you stool forms back up.  If your symptoms do not get better within 1 week or if they get worse, check with your doctor.  If you experience "the worst abdominal pain ever" or develop nausea or vomiting, please contact the office immediately for further recommendations for treatment.   ITCHING:  If you experience itching with your medications, try taking only a single pain pill, or even half a pain pill at a time.  You can also use Benadryl over the counter for itching or also to help with sleep.   TED HOSE STOCKINGS:  Use stockings on both legs until for at least 2 weeks or as   directed by physician office. They may be removed at night for sleeping.  MEDICATIONS:  See your medication summary on the "After Visit Summary" that nursing will review with you.  You may have some home medications which will be placed on hold until you complete the course of blood thinner medication.  It is important for you to complete the blood thinner medication as prescribed.  PRECAUTIONS:  If you experience chest pain or shortness of breath - call 911 immediately for transfer to the hospital emergency department.   If you develop a fever greater that 101 F, purulent drainage from wound, increased redness or drainage from wound, foul odor from the wound/dressing, or calf pain - CONTACT YOUR SURGEON.                                                   FOLLOW-UP APPOINTMENTS:  If you do not already have a post-op appointment, please call the office for an appointment to be seen by your surgeon.  Guidelines for how soon to be seen are listed in your "After Visit Summary", but are typically between 1-4 weeks after surgery.  OTHER INSTRUCTIONS:    DENTAL ANTIBIOTICS:  In most cases prophylactic antibiotics for Dental procdeures after total joint surgery are  not necessary.  Exceptions are as follows:  1. History of prior total joint infection  2. Severely immunocompromised (Organ Transplant, cancer chemotherapy, Rheumatoid biologic meds such as Humera)  3. Poorly controlled diabetes (A1C &gt; 8.0, blood glucose over 200)  If you have one of these conditions, contact your surgeon for an antibiotic prescription, prior to your dental procedure.   MAKE SURE YOU:  . Understand these instructions.  . Get help right away if you are not doing well or get worse.    Thank you for letting us be a part of your medical care team.  It is a privilege we respect greatly.  We hope these instructions will help you stay on track for a fast and full recovery!   

## 2020-05-11 NOTE — Anesthesia Postprocedure Evaluation (Signed)
Anesthesia Post Note  Patient: Kristin Rush  Procedure(s) Performed: ARTHROPLASTY BIPOLAR HIP (HEMIARTHROPLASTY) (Left Hip)     Patient location during evaluation: PACU Anesthesia Type: General Level of consciousness: awake and alert, oriented and patient cooperative Pain management: pain level controlled Vital Signs Assessment: post-procedure vital signs reviewed and stable Respiratory status: spontaneous breathing, nonlabored ventilation and respiratory function stable Cardiovascular status: blood pressure returned to baseline and stable Postop Assessment: no apparent nausea or vomiting Anesthetic complications: no   No complications documented.  Last Vitals:  Vitals:   05/11/20 1754 05/11/20 1800  BP: (!) 164/71 (!) 158/82  Pulse: 98 97  Resp: 15 15  Temp: (!) 36.1 C   SpO2: 100% 100%    Last Pain:  Vitals:   05/11/20 1754  TempSrc:   PainSc: Asleep                 Lannie Fields

## 2020-05-12 ENCOUNTER — Encounter (HOSPITAL_COMMUNITY): Payer: Self-pay | Admitting: Orthopedic Surgery

## 2020-05-12 DIAGNOSIS — W19XXXA Unspecified fall, initial encounter: Secondary | ICD-10-CM

## 2020-05-12 DIAGNOSIS — Y92009 Unspecified place in unspecified non-institutional (private) residence as the place of occurrence of the external cause: Secondary | ICD-10-CM

## 2020-05-12 DIAGNOSIS — I63511 Cerebral infarction due to unspecified occlusion or stenosis of right middle cerebral artery: Secondary | ICD-10-CM

## 2020-05-12 DIAGNOSIS — G918 Other hydrocephalus: Secondary | ICD-10-CM

## 2020-05-12 LAB — MAGNESIUM: Magnesium: 1.9 mg/dL (ref 1.7–2.4)

## 2020-05-12 LAB — URINALYSIS, ROUTINE W REFLEX MICROSCOPIC
Bilirubin Urine: NEGATIVE
Glucose, UA: NEGATIVE mg/dL
Hgb urine dipstick: NEGATIVE
Ketones, ur: NEGATIVE mg/dL
Leukocytes,Ua: NEGATIVE
Nitrite: NEGATIVE
Protein, ur: NEGATIVE mg/dL
Specific Gravity, Urine: 1.016 (ref 1.005–1.030)
pH: 5 (ref 5.0–8.0)

## 2020-05-12 LAB — CBC WITH DIFFERENTIAL/PLATELET
Abs Immature Granulocytes: 0.04 10*3/uL (ref 0.00–0.07)
Basophils Absolute: 0 10*3/uL (ref 0.0–0.1)
Basophils Relative: 0 %
Eosinophils Absolute: 0 10*3/uL (ref 0.0–0.5)
Eosinophils Relative: 0 %
HCT: 27 % — ABNORMAL LOW (ref 36.0–46.0)
Hemoglobin: 9 g/dL — ABNORMAL LOW (ref 12.0–15.0)
Immature Granulocytes: 0 %
Lymphocytes Relative: 5 %
Lymphs Abs: 0.5 10*3/uL — ABNORMAL LOW (ref 0.7–4.0)
MCH: 31.3 pg (ref 26.0–34.0)
MCHC: 33.3 g/dL (ref 30.0–36.0)
MCV: 93.8 fL (ref 80.0–100.0)
Monocytes Absolute: 0.2 10*3/uL (ref 0.1–1.0)
Monocytes Relative: 2 %
Neutro Abs: 8.4 10*3/uL — ABNORMAL HIGH (ref 1.7–7.7)
Neutrophils Relative %: 93 %
Platelets: 102 10*3/uL — ABNORMAL LOW (ref 150–400)
RBC: 2.88 MIL/uL — ABNORMAL LOW (ref 3.87–5.11)
RDW: 13.2 % (ref 11.5–15.5)
WBC: 9.1 10*3/uL (ref 4.0–10.5)
nRBC: 0 % (ref 0.0–0.2)

## 2020-05-12 LAB — COMPREHENSIVE METABOLIC PANEL
ALT: 20 U/L (ref 0–44)
AST: 21 U/L (ref 15–41)
Albumin: 3 g/dL — ABNORMAL LOW (ref 3.5–5.0)
Alkaline Phosphatase: 56 U/L (ref 38–126)
Anion gap: 9 (ref 5–15)
BUN: 16 mg/dL (ref 8–23)
CO2: 22 mmol/L (ref 22–32)
Calcium: 8 mg/dL — ABNORMAL LOW (ref 8.9–10.3)
Chloride: 103 mmol/L (ref 98–111)
Creatinine, Ser: 0.83 mg/dL (ref 0.44–1.00)
GFR calc Af Amer: >60 mL/min (ref >60)
GFR calc non Af Amer: >60 mL/min (ref >60)
Glucose, Bld: 164 mg/dL — ABNORMAL HIGH (ref 70–99)
Potassium: 4.2 mmol/L (ref 3.5–5.1)
Sodium: 134 mmol/L — ABNORMAL LOW (ref 135–145)
Total Bilirubin: 0.6 mg/dL (ref 0.3–1.2)
Total Protein: 5.2 g/dL — ABNORMAL LOW (ref 6.5–8.1)

## 2020-05-12 MED ORDER — ASPIRIN EC 81 MG PO TBEC
81.0000 mg | DELAYED_RELEASE_TABLET | Freq: Every day | ORAL | Status: DC
  Administered 2020-05-12 – 2020-05-14 (×3): 81 mg via ORAL
  Filled 2020-05-12 (×3): qty 1

## 2020-05-12 MED ORDER — RESOURCE THICKENUP CLEAR PO POWD
ORAL | Status: DC | PRN
  Filled 2020-05-12 (×3): qty 125

## 2020-05-12 MED ORDER — HYDROCODONE-ACETAMINOPHEN 5-325 MG PO TABS
1.0000 | ORAL_TABLET | ORAL | Status: DC | PRN
  Administered 2020-05-13 (×3): 1 via ORAL
  Filled 2020-05-12 (×3): qty 1

## 2020-05-12 MED ORDER — HYDROCODONE-ACETAMINOPHEN 7.5-325 MG PO TABS: 1.0000 | ORAL_TABLET | ORAL | Status: DC | PRN

## 2020-05-12 NOTE — Evaluation (Signed)
Physical Therapy Evaluation Patient Details Name: Kristin Rush MRN: [id number] DOB: 11-20-1940 Today's Date: 05/12/2020   History of Present Illness  Patient is 79 y.o. female s/p Lt hemiarthroplasty on 05/11/20 secondary to fall out of bed. PMH history significant for recent stroke (hospitalization 8/18-8/25), CHF, osteoporosis, orthostatic hypotension, HOH.     Clinical Impression  Kristin Rush is a 79 y.o. female POD 1 s/p Lt hip hemiarthroplasty. Patient and her sister provide history/PLOF and report she requires assist with RW for transfers and gait PTA and has needed increased assist since recent CVA. Patient is now limited by functional impairments (see PT problem list below) and requires max assist for bed mobility with cues to initiaite and maintain posterior hip precautions. Patient complete sit<>stand with Mod +2 assist for safety and c/o of dizziness and became mildly diaphoretic in standing. (See vital below for orthostatics). Patient returned to supine and BP improved. Patient will benefit from continued skilled PT interventions to address impairments and progress towards PLOF. Recommending SNF level therapy for follow up at this time vs. HHPT with 24/7 assist if family and provide supervision and physical assist needed.   Follow Up Recommendations Follow surgeon's recommendation for DC plan and follow-up therapies;Home health PT;Supervision/Assistance - 24 hour;SNF    Equipment Recommendations  None recommended by PT    Recommendations for Other Services       Precautions / Restrictions Precautions Precautions: Fall;Posterior Hip Precaution Booklet Issued: Yes (comment) Precaution Comments: L inattention secondary to recent CVA Restrictions Weight Bearing Restrictions: No Other Position/Activity Restrictions: WBAT Lt LE      Mobility  Bed Mobility Overal bed mobility: Needs Assistance Bed Mobility: Supine to Sit;Sit to Supine;Rolling Rolling: Mod assist;Max  assist   Supine to sit: HOB elevated;Max assist;+2 for safety/equipment;Mod assist Sit to supine: Max assist;+2 for physical assistance;+2 for safety/equipment   General bed mobility comments: Mod-Max assist wtih repeated cues to sit up to EOB and tactile cues to maintain Posterior hip precautions. Pt required max assist to prevent posterior lean sitting EOB initially and cues to keep feet on floor. Max +2 assist to return to supine due to poor sequencing and weakness. Mod-max assist for rolling in bed for linen change and assist to maintain hip precautions.  Transfers Overall transfer level: Needs assistance Equipment used: Rolling walker (2 wheeled) Transfers: Sit to/from Stand Sit to Stand: Min assist;+2 physical assistance;+2 safety/equipment;From elevated surface         General transfer comment: EOB elevated and verbal/tactile cues requried for safe hand placement on RW for stand. Min assist to complete rise and steady. Pt with c/o dizziness and significant drop in BP (see vitals for details).  Ambulation/Gait         Stairs       Wheelchair Mobility    Modified Rankin (Stroke Patients Only)       Balance Overall balance assessment: Needs assistance Sitting-balance support: Feet supported;Bilateral upper extremity supported Sitting balance-Leahy Scale: Poor Sitting balance - Comments: significant posterior lean initially requirined Max assist, reduced to min-mod assist with ceus to lean trunk forward.  Postural control: Posterior lean Standing balance support: Bilateral upper extremity supported Standing balance-Leahy Scale: Poor                Pertinent Vitals/Pain Pain Assessment: Faces Faces Pain Scale: Hurts a little bit Pain Location: Lt hip Pain Descriptors / Indicators: Guarding Pain Intervention(s): Limited activity within patient's tolerance;Monitored during session;Repositioned    Home Living Family/patient expects to be  discharged to:: Private  residence Living Arrangements: Other relatives (sister "[name]") Available Help at Discharge: Available 24 hours/day;Family;Personal care attendant Type of Home: House Home Access: Ramped entrance     Home Layout: Two level;Able to live on main level with bedroom/bathroom;Full bath on main level Home Equipment: Walker - 2 wheels;Walker - 4 wheels;Cane - single point;Grab bars - toilet;Tub bench;Grab bars - tub/shower;Hand held shower head;Bedside commode;Wheelchair - manual;Walker - standard;Hospital bed Additional Comments: pt lives with her sister and they have hired a caregiver to be with her 7 days/week for ~8-10 hours per day (daytime hrs). Has ~3-4 sisters to help her.     Prior Function Level of Independence: Needs assistance   Gait / Transfers Assistance Needed: pt ambulates with RW and assist from 1 person in home. Has been recieving HHPT due to recent CVA.   ADL's / Homemaking Assistance Needed: pt receives assist to dress (bra, socks shoes), sits on shower bench for showers with assist of sisters to wash (she can wash UB), uses BSC at bedside and toilet riser in bathroom with A        Hand Dominance   Dominant Hand: Right    Extremity/Trunk Assessment   Upper Extremity Assessment Upper Extremity Assessment: Generalized weakness;Defer to OT evaluation    Lower Extremity Assessment Lower Extremity Assessment: Generalized weakness;LLE deficits/detail LLE Deficits / Details: pt limited by pain and guarding Lt LE.     Cervical / Trunk Assessment Cervical / Trunk Assessment: Kyphotic  Communication   Communication: No difficulties;HOH  Cognition Arousal/Alertness: Awake/alert Behavior During Therapy: WFL for tasks assessed/performed Overall Cognitive Status: History of cognitive impairments - at baseline                   Orientation Level: Disoriented to;Situation;Time   Memory: Decreased short-term memory;Decreased recall of precautions Following Commands:  Follows one step commands with increased time;Follows one step commands consistently     Problem Solving: Slow processing;Decreased initiation;Difficulty sequencing;Requires verbal cues General Comments: Pt with slowed response to all commands, required repeated cues to complete action or perform correct sequencing.      General Comments      Exercises     Assessment/Plan    PT Assessment Patient needs continued PT services  PT Problem List Decreased strength;Decreased mobility;Decreased safety awareness;Decreased range of motion;Decreased activity tolerance;Decreased balance;Decreased knowledge of use of DME;Decreased cognition       PT Treatment Interventions DME instruction;Therapeutic exercise;Gait training;Stair training;Cognitive remediation;Functional mobility training;Therapeutic activities;Patient/family education;Neuromuscular re-education;Balance training    PT Goals (Current goals can be found in the Care Plan section)  Acute Rehab PT Goals Patient Stated Goal: to go home with assist from aid and family PT Goal Formulation: With patient/family Time For Goal Achievement: 05/26/20 Potential to Achieve Goals: Good    Frequency Min 4X/week   Barriers to discharge           AM-PAC PT "6 Clicks" Mobility  Outcome Measure Help needed turning from your back to your side while in a flat bed without using bedrails?: A Lot Help needed moving from lying on your back to sitting on the side of a flat bed without using bedrails?: A Lot Help needed moving to and from a bed to a chair (including a wheelchair)?: Total Help needed standing up from a chair using your arms (e.g., wheelchair or bedside chair)?: Total Help needed to walk in hospital room?: Total Help needed climbing 3-5 steps with a railing? : Total 6 Click Score: 8  End of Session Equipment Utilized During Treatment: Gait belt Activity Tolerance: Patient tolerated treatment well;Treatment limited secondary to  medical complications (Comment) (orthostatic hypotension) Patient left: in bed;with call bell/phone within reach;with bed alarm set;with family/visitor present;with SCD's reapplied Nurse Communication: Mobility status PT Visit Diagnosis: Repeated falls (R29.6);Muscle weakness (generalized) (M62.81);Difficulty in walking, not elsewhere classified (R26.2);Unsteadiness on feet (R26.81);Other abnormalities of gait and mobility (R26.89)    Time: 0737-1062 PT Time Calculation (min) (ACUTE ONLY): 40 min   Charges:   PT Evaluation $PT Eval Low Complexity: 1 Low PT Treatments $Therapeutic Activity: 23-37 mins       Wynn Maudlin, DPT Acute Rehabilitation Services  Office [phone] Pager [phone]  05/12/2020 12:41 PM

## 2020-05-12 NOTE — Progress Notes (Addendum)
Subjective: 1 Day Post-Op s/p Procedure(s): ARTHROPLASTY BIPOLAR HIP (HEMIARTHROPLASTY)   Patient somnolent on exam. States she is in no pain this morning. Denies chest pain, SOB, nausea or vomiting. No other complaints.   Objective:  PE: VITALS:   Vitals:   05/11/20 2122 05/11/20 2220 05/12/20 0211 05/12/20 0640  BP: (!) 160/85 (!) 152/72 (!) 156/77 (!) 151/81  Pulse: 94 82 84 79  Resp: 16 16 16 16   Temp: 99.4 F (37.4 C) 98.6 F (37 C) 98 F (36.7 C) 97.7 F (36.5 C)  TempSrc: Oral Axillary Oral Oral  SpO2: 100% 100% 100% 99%  Weight:      Height:        ABD soft Neurovascular intact Sensation intact distally Intact pulses distally Dorsiflexion/Plantar flexion intact Incision: scant drainage  LABS  Results for orders placed or performed during the hospital encounter of 05/10/20 (from the past 24 hour(s))  Prepare RBC (crossmatch)     Status: None   Collection Time: 05/11/20 12:56 PM  Result Value Ref Range   Order Confirmation      ORDER PROCESSED BY BLOOD BANK Performed at [facility], [address], [place], [place]   Magnesium     Status: None   Collection Time: 05/12/20  3:13 AM  Result Value Ref Range   Magnesium 1.9 1.7 - 2.4 mg/dL  CBC WITH DIFFERENTIAL     Status: Abnormal   Collection Time: 05/12/20  3:13 AM  Result Value Ref Range   WBC 9.1 4.0 - 10.5 K/uL   RBC 2.88 (L) 3.87 - 5.11 MIL/uL   Hemoglobin 9.0 (L) 12.0 - 15.0 g/dL   HCT 07/12/20 (L) 36 - 46 %   MCV 93.8 80.0 - 100.0 fL   MCH 31.3 26.0 - 34.0 pg   MCHC 33.3 30.0 - 36.0 g/dL   RDW 38.9 37.3 - 42.8 %   Platelets 102 (L) 150 - 400 K/uL   nRBC 0.0 0.0 - 0.2 %   Neutrophils Relative % 93 %   Neutro Abs 8.4 (H) 1.7 - 7.7 K/uL   Lymphocytes Relative 5 %   Lymphs Abs 0.5 (L) 0.7 - 4.0 K/uL   Monocytes Relative 2 %   Monocytes Absolute 0.2 0 - 1 K/uL   Eosinophils Relative 0 %   Eosinophils Absolute 0.0 0 - 0 K/uL   Basophils Relative 0 %    Basophils Absolute 0.0 0 - 0 K/uL   Immature Granulocytes 0 %   Abs Immature Granulocytes 0.04 0.00 - 0.07 K/uL  Comprehensive metabolic panel     Status: Abnormal   Collection Time: 05/12/20  3:13 AM  Result Value Ref Range   Sodium 134 (L) 135 - 145 mmol/L   Potassium 4.2 3.5 - 5.1 mmol/L   Chloride 103 98 - 111 mmol/L   CO2 22 22 - 32 mmol/L   Glucose, Bld 164 (H) 70 - 99 mg/dL   BUN 16 8 - 23 mg/dL   Creatinine, Ser 07/12/20 0.44 - 1.00 mg/dL   Calcium 8.0 (L) 8.9 - 10.3 mg/dL   Total Protein 5.2 (L) 6.5 - 8.1 g/dL   Albumin 3.0 (L) 3.5 - 5.0 g/dL   AST 21 15 - 41 U/L   ALT 20 0 - 44 U/L   Alkaline Phosphatase 56 38 - 126 U/L   Total Bilirubin 0.6 0.3 - 1.2 mg/dL   GFR calc non Af Amer >60 >60 mL/min   GFR calc Af  Amer >60 >60 mL/min   Anion gap 9 5 - 15    DG Chest 1 View  Result Date: 05/10/2020 CLINICAL DATA:  Larey SeatFell out of bed, left hip fracture EXAM: CHEST  1 VIEW COMPARISON:  None. FINDINGS: Single frontal view of the chest demonstrates an unremarkable cardiac silhouette. No airspace disease, effusion, or pneumothorax. Calcified pleural plaque at the left apex. No acute bony abnormalities. IMPRESSION: 1. No acute intrathoracic process. Electronically Signed   By: Sharlet SalinaMichael  Bocephus Cali M.D.   On: 05/10/2020 21:54   CT Head Wo Contrast  Result Date: 05/10/2020 CLINICAL DATA:  Fall EXAM: CT HEAD WITHOUT CONTRAST CT CERVICAL SPINE WITHOUT CONTRAST TECHNIQUE: Multidetector CT imaging of the head and cervical spine was performed following the standard protocol without intravenous contrast. Multiplanar CT image reconstructions of the cervical spine were also generated. COMPARISON:  CT, CT angiography head and neck, MRI head 04/28/2020 FINDINGS: CT HEAD FINDINGS Brain: Images are motion degraded despite multiple attempts at acquisition, may limit detection of subtle anomaly. Of all vein area of encephalomalacia in the right corona radiata extending into the white matter tracts compatible with an  area of infarct seen on recent MRI. Additional bilateral lacunar type infarcts in the basal ganglia. Streak artifact across the temporal lobes and pons/medulla. No convincing evidence of acute infarction, hemorrhage, hydrocephalus, extra-axial collection or mass lesion/mass effect. Symmetric prominence of the ventricles, cisterns and sulci compatible with parenchymal volume loss. Patchy areas of white matter hypoattenuation are most compatible with chronic microvascular angiopathy. Vascular: Atherosclerotic calcification of the carotid siphons and intradural vertebral arteries. No hyperdense vessel. Skull: No significant scalp swelling or hematoma. No calvarial fracture or worrisome osseous lesions. Stable benign sessile osteoma of the frontal bone (4/23). Sinuses/Orbits: Paranasal sinuses and mastoid air cells are predominantly clear. Included orbital structures are unremarkable. Other: None. CT CERVICAL SPINE FINDINGS Alignment: Stabilization collar is absent at the time of examination. There is preservation of normal cervical lordosis. Some mild retrolisthesis C4 on C5 is unchanged from comparison. No evidence of traumatic listhesis. No abnormally widened, perched or jumped facets. Normal alignment of the craniocervical and atlantoaxial articulations. Skull base and vertebrae: No acute skull base fracture. No vertebral body fracture or height loss. Normal bone mineralization. No worrisome osseous lesions. Small vertebral body hemangioma at C7 is unchanged from comparison. Arthrosis at the atlantodental and basion dens interval with some hypertrophic spurring and degenerative ossifications. Multilevel spondylitic changes as detailed below. Soft tissues and spinal canal: No pre or paravertebral fluid or swelling. No visible canal hematoma. Disc levels: Multilevel intervertebral disc height loss with spondylitic endplate changes. Multilevel disc osteophyte complexes are present with some mild multilevel spinal canal  stenosis C3-C6 and more moderate central canal stenosis C6-7. Multilevel uncinate spurring and facet hypertrophic changes as well resulting in mild-to-moderate multilevel foraminal narrowing with more moderate to severe narrowing C4-5 and C5-6 bilaterally. Upper chest: No acute abnormality in the upper chest or imaged lung apices. Calcifications of the cervical carotids and proximal great vessels. Other: Normal thyroid gland. IMPRESSION: 1. Images are motion degraded despite multiple attempts at acquisition, may limit detection of subtle anomaly. 2. No convincing evidence of acute intracranial abnormality. No significant scalp swelling or calvarial fracture. 3. Chronic microvascular angiopathy and parenchymal volume loss. 4. Expected evolution of encephalomalacia in the right corona radiata extending into the white matter tracts corresponding to the infarct seen on comparison MRI. 5. Remote lacunar type infarcts in the bilateral basal ganglia. 6. No evidence of acute fracture or  traumatic listhesis of the cervical spine. 7. Multilevel degenerative changes of the cervical spine as described above. 8. Intracranial and cervical atherosclerosis. Electronically Signed   By: Kreg Shropshire M.D.   On: 05/10/2020 22:22   CT Cervical Spine Wo Contrast  Result Date: 05/10/2020 CLINICAL DATA:  Fall EXAM: CT HEAD WITHOUT CONTRAST CT CERVICAL SPINE WITHOUT CONTRAST TECHNIQUE: Multidetector CT imaging of the head and cervical spine was performed following the standard protocol without intravenous contrast. Multiplanar CT image reconstructions of the cervical spine were also generated. COMPARISON:  CT, CT angiography head and neck, MRI head 04/28/2020 FINDINGS: CT HEAD FINDINGS Brain: Images are motion degraded despite multiple attempts at acquisition, may limit detection of subtle anomaly. Of all vein area of encephalomalacia in the right corona radiata extending into the white matter tracts compatible with an area of infarct  seen on recent MRI. Additional bilateral lacunar type infarcts in the basal ganglia. Streak artifact across the temporal lobes and pons/medulla. No convincing evidence of acute infarction, hemorrhage, hydrocephalus, extra-axial collection or mass lesion/mass effect. Symmetric prominence of the ventricles, cisterns and sulci compatible with parenchymal volume loss. Patchy areas of white matter hypoattenuation are most compatible with chronic microvascular angiopathy. Vascular: Atherosclerotic calcification of the carotid siphons and intradural vertebral arteries. No hyperdense vessel. Skull: No significant scalp swelling or hematoma. No calvarial fracture or worrisome osseous lesions. Stable benign sessile osteoma of the frontal bone (4/23). Sinuses/Orbits: Paranasal sinuses and mastoid air cells are predominantly clear. Included orbital structures are unremarkable. Other: None. CT CERVICAL SPINE FINDINGS Alignment: Stabilization collar is absent at the time of examination. There is preservation of normal cervical lordosis. Some mild retrolisthesis C4 on C5 is unchanged from comparison. No evidence of traumatic listhesis. No abnormally widened, perched or jumped facets. Normal alignment of the craniocervical and atlantoaxial articulations. Skull base and vertebrae: No acute skull base fracture. No vertebral body fracture or height loss. Normal bone mineralization. No worrisome osseous lesions. Small vertebral body hemangioma at C7 is unchanged from comparison. Arthrosis at the atlantodental and basion dens interval with some hypertrophic spurring and degenerative ossifications. Multilevel spondylitic changes as detailed below. Soft tissues and spinal canal: No pre or paravertebral fluid or swelling. No visible canal hematoma. Disc levels: Multilevel intervertebral disc height loss with spondylitic endplate changes. Multilevel disc osteophyte complexes are present with some mild multilevel spinal canal stenosis C3-C6  and more moderate central canal stenosis C6-7. Multilevel uncinate spurring and facet hypertrophic changes as well resulting in mild-to-moderate multilevel foraminal narrowing with more moderate to severe narrowing C4-5 and C5-6 bilaterally. Upper chest: No acute abnormality in the upper chest or imaged lung apices. Calcifications of the cervical carotids and proximal great vessels. Other: Normal thyroid gland. IMPRESSION: 1. Images are motion degraded despite multiple attempts at acquisition, may limit detection of subtle anomaly. 2. No convincing evidence of acute intracranial abnormality. No significant scalp swelling or calvarial fracture. 3. Chronic microvascular angiopathy and parenchymal volume loss. 4. Expected evolution of encephalomalacia in the right corona radiata extending into the white matter tracts corresponding to the infarct seen on comparison MRI. 5. Remote lacunar type infarcts in the bilateral basal ganglia. 6. No evidence of acute fracture or traumatic listhesis of the cervical spine. 7. Multilevel degenerative changes of the cervical spine as described above. 8. Intracranial and cervical atherosclerosis. Electronically Signed   By: Kreg Shropshire M.D.   On: 05/10/2020 22:22   DG Hip Port Unilat With Pelvis 1V Left  Result Date: 05/11/2020 CLINICAL DATA:  Left hip arthroplasty, left hip fracture EXAM: DG HIP (WITH OR WITHOUT PELVIS) 1V PORT LEFT COMPARISON:  05/10/2020 FINDINGS: Frontal view of the pelvis as well as frontal and cross-table lateral views of the left hip are obtained. Left hip hemiarthroplasty is identified in the expected position without signs of acute complication. Postsurgical changes are seen within the overlying soft tissues. Right hip and the remainder of the bony pelvis are unremarkable. IMPRESSION: 1. Unremarkable left hip hemiarthroplasty. Electronically Signed   By: Sharlet Salina M.D.   On: 05/11/2020 19:29   DG Hip Unilat With Pelvis 2-3 Views Left  Result  Date: 05/10/2020 CLINICAL DATA:  Larey Seat out of bed, left leg deformity EXAM: DG HIP (WITH OR WITHOUT PELVIS) 2-3V LEFT COMPARISON:  None. FINDINGS: Frontal view of the pelvis as well as frontal and cross-table lateral views of the left hip are obtained. There is a subcapital left femoral neck fracture with impaction and varus angulation at the fracture site. No dislocation. The remainder of the bony pelvis is unremarkable. IMPRESSION: 1. Subcapital left femoral neck fracture as above. Electronically Signed   By: Sharlet Salina M.D.   On: 05/10/2020 21:54    Assessment/Plan: Left femoral neck fracture after fall: 1 Day Post-Op s/p Procedure(s): ARTHROPLASTY BIPOLAR HIP (HEMIARTHROPLASTY) - no pain this morning  Acute Blood loss anemia - Hbg 9.0 this morning, will continue to watch   Weightbearing: WBAT LLE Insicional and dressing care: Reinforce dressings as needed  VTE prophylaxis: Dr. Salvadore Farber in anesthesia and Dr. Dion Saucier spoke with patient's neurologist yesterday who recommended continuing dual platelet therapy of aspirin and Plavix as originally planned due to her stroke and also adding on our normal regimen of DVT prophylaxis, added Lovenox 40 mg qd - Addendum (2:45pm 05/12/20) - after further conversation with Dr. Dion Saucier he recommends discontinuing lovenox. Spoke with Dr. Parke Poisson regarding this, she agrees however, noted that Plavix last dose will be in approximately one week. Patient should plan to start on Lovenox for DVT prophylaxis after Plavix has been completed. Pain control: tylenol and norco, continue current regimen - so patient has needed very little pain control Follow - up plan: Follow-up with Dr. Dion Saucier 1 weeks after discharge Dispo: pending PT eval  Contact information:   Weekdays 8-5 Janine Ores, PA-C [phone] A fter hours and holidays please check [email or web address] for group call information for [facility]  Armida Sans 05/12/2020, 7:17 AM

## 2020-05-12 NOTE — Progress Notes (Signed)
Patient ID: Kristin Rush, female   DOB: [birth year], 79 y.o.   MRN: [id number] Films reviewed, and I spoke with Dr. Roda Shutters. The simple observation that the ventricles are enlarged is not the same as a diagnosis of hydrocephalus. The gyri and sulci are quite distinct, there is cerebral atrophy present. Also there has been no provocative test showing improvement in her symptoms with spinal fluid removal. There is no connection that I know of between orthostatic hypotension and possible hydrocephalus. 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 The evaluation should be done by neurology, and it is appropriately done as an outpatient.

## 2020-05-12 NOTE — Progress Notes (Addendum)
PROGRESS NOTE    Kristin Rush  LOV:[id number] DOB: [birth year] DOA: 05/10/2020 PCP: Eartha Inch, MD    Chief Complaint  Patient presents with  . Fall    Brief Narrative:  Patient with chronic hydrocephalus, orthostatic hypotension, patient history of CVA, admitted to the hospital this time due to hip fracture after fall at home  Subjective: Postop day 1 Feels weak and lethargic, she is not able to tell me the month, she knows she is in the hospital but not able to name it She knows she underwent surgery but does not know the type of surgery Sister at bedside states this is her baseline She denies pain Daughter report patient urine is dark, concerning for dehydration  Assessment & Plan:   Principal Problem:   Fracture of femoral neck, left (HCC) Active Problems:   Essential hypertension   Fall at home, initial encounter   Chronic diastolic CHF (congestive heart failure) (HCC)   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 Denies pain anywhere else. States pain is worse with movement and better with rest. - s/p  left hip hemiarthroplasty with Dr. Dion Saucier on August 30 -Per orthopedic discussion with neurology, continue dual antiplatelet therapy with Plavix and aspirin, add on normal DVT prophylaxis postop Lovenox 40 mg subcu daily -Weightbearing as tolerated left lower extremity -Postop pain control per Ortho -Monitor hemoglobin  Recent history of acute CVA, right corona radiata infarct 2/2 small vessel ischemic disease -Required hospitalization from August 18 August 25,  --Neurology recommended Asa/Plavix 75 mg p.o. daily for 3 weeks, then asa alone, ( plavix was started on 8/20 per chart review) ----Atorvastatin 80 mg p.o. 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  sister request me to contact neurosurgery while she is in the hospital,  Neurosurgery Dr. Franky Macho states outpatient follow up.   Failure to thrive: Family report patient has 24/7 care at home, [facility],   awaiting PT eval   DVT prophylaxis: enoxaparin (LOVENOX) injection 40 mg Start: 05/12/20 0800 SCDs Start: 05/11/20 1935 SCDs Start: 05/11/20 0536   Code Status: full Family Communication: sister Disposition:   Status is: Inpatient  Dispo: The patient is from: home               Anticipated d/c is to: pending PT eval              Anticipated d/c date is: possible tomorrow                Consultants:   Ortho  neurosurgery  Procedures:  s/p  left hip hemiarthroplasty with Dr. Dion Saucier on August 30  Antimicrobials:   Perioperative     Objective: Vitals:   05/11/20 2122 05/11/20 2220 05/12/20  0211 05/12/20 0640  BP: (!) 160/85 (!) 152/72 (!) 156/77 (!) 151/81  Pulse: 94 82 84 79  Resp: 16 16 16 16   Temp: 99.4 F (37.4 C) 98.6 F (37 C) 98 F (36.7 C) 97.7 F (36.5 C)  TempSrc: Oral Axillary Oral Oral  SpO2: 100% 100% 100% 99%  Weight:      Height:        Intake/Output Summary (Last 24 hours) at 05/12/2020 0846 Last data filed at 05/12/2020 0215 Gross per 24 hour  Intake 2250 ml  Output 1000 ml  Net 1250 ml    Filed Weights   05/11/20 1445  Weight: 59 kg    Examination:  General exam: Frail, confused , NAD Respiratory system: Clear to auscultation. Respiratory effort normal. Cardiovascular system: S1 & S2 heard, RRR. No JVD, no murmur, No pedal edema. Gastrointestinal system: Abdomen is nondistended, soft and nontender. No organomegaly or masses felt. Normal bowel sounds heard. Central nervous system: Alert and oriented. No focal neurological deficits. Extremities: Left hip postop changes, neurovascular intact distally Skin: No rashes, lesions or ulcers Psychiatry: Calm, cooperative    Data Reviewed: I have personally reviewed following labs and imaging studies  CBC: Recent Labs  Lab 05/10/20 2340 05/12/20 0313  WBC 9.2 9.1  NEUTROABS 7.7 8.4*  HGB 13.0 9.0*  HCT 40.0 27.0*  MCV 93.7 93.8  PLT 146* 102*    Basic Metabolic Panel: Recent Labs  Lab 05/05/20 0904 05/10/20 2340 05/12/20 0313  NA  --  139 134*  K  --  4.3 4.2  CL  --  102 103  CO2  --  26 22  GLUCOSE  --  103* 164*  BUN  --  14 16  CREATININE 0.90 0.91 0.83  CALCIUM  --  9.2 8.0*  MG  --   --  1.9    GFR: Estimated Creatinine Clearance: 47 mL/min (by C-G formula based on SCr of 0.83 mg/dL).  Liver Function Tests: Recent Labs  Lab 05/12/20 0313  AST 21  ALT 20  ALKPHOS 56  BILITOT 0.6  PROT 5.2*  ALBUMIN 3.0*    CBG: No results for input(s): GLUCAP in the last 168 hours.   Recent Results (from the past 240 hour(s))  SARS Coronavirus 2 by RT PCR (hospital order, performed in [facility] hospital lab) Nasopharyngeal Nasopharyngeal Swab     Status: None   Collection Time: 05/11/20  3:30 AM   Specimen: Nasopharyngeal Swab  Result Value Ref Range Status   SARS Coronavirus 2 NEGATIVE NEGATIVE Final    Comment: (NOTE) SARS-CoV-2 target nucleic acids are NOT DETECTED.  The SARS-CoV-2 RNA is generally detectable in upper and lower respiratory specimens during the acute phase of infection.  The lowest concentration of SARS-CoV-2 viral copies this assay can detect is 250 copies / mL. A negative result does not preclude SARS-CoV-2 infection and should not be used as the sole basis for treatment or other patient management decisions.  A negative result may occur with improper specimen collection / handling, submission of specimen other than nasopharyngeal swab, presence of viral mutation(s) within the areas targeted by this assay, and inadequate number of viral copies (<250 copies / mL). A negative result must be combined with clinical observations, patient history, and epidemiological information.  Fact Sheet for Patients:   05/13/20  Fact Sheet for Healthcare Providers: BoilerBrush.com.cy  This test is not yet approved or  cleared by the https://pope.com/ FDA and has been authorized for detection and/or  diagnosis of SARS-CoV-2 by FDA under an Emergency Use Authorization (EUA).  This EUA will remain in effect (meaning this test can be used) for the duration of the COVID-19 declaration under Section 564(b)(1) of the Act, 21 U.S.C. section 360bbb-3(b)(1), unless the authorization is terminated or revoked sooner.  Performed at [facility], [address], [place], [place] [address]          Radiology Studies: DG Chest 1 View  Result Date: 05/10/2020 CLINICAL DATA:  Larey Seat out of bed, left hip fracture EXAM: CHEST  1 VIEW COMPARISON:  None. FINDINGS: Single frontal view of the chest demonstrates an unremarkable cardiac silhouette. No airspace disease, effusion, or pneumothorax. Calcified pleural plaque at the left apex. No acute bony abnormalities. IMPRESSION: 1. No acute intrathoracic process. Electronically Signed   By: Sharlet Salina M.D.   On: 05/10/2020 21:54   CT Head Wo Contrast  Result Date: 05/10/2020 CLINICAL DATA:  Fall EXAM: CT HEAD WITHOUT CONTRAST CT CERVICAL SPINE WITHOUT CONTRAST  TECHNIQUE: Multidetector CT imaging of the head and cervical spine was performed following the standard protocol without intravenous contrast. Multiplanar CT image reconstructions of the cervical spine were also generated. COMPARISON:  CT, CT angiography head and neck, MRI head 04/28/2020 FINDINGS: CT HEAD FINDINGS Brain: Images are motion degraded despite multiple attempts at acquisition, may limit detection of subtle anomaly. Of all vein area of encephalomalacia in the right corona radiata extending into the white matter tracts compatible with an area of infarct seen on recent MRI. Additional bilateral lacunar type infarcts in the basal ganglia. Streak artifact across the temporal lobes and pons/medulla. No convincing evidence of acute infarction, hemorrhage, hydrocephalus, extra-axial collection or mass lesion/mass effect. Symmetric prominence of the ventricles, cisterns and sulci compatible with parenchymal volume loss. Patchy areas of white matter hypoattenuation are most compatible with chronic microvascular angiopathy. Vascular: Atherosclerotic calcification of the carotid siphons and intradural vertebral arteries. No hyperdense vessel. Skull: No significant scalp swelling or hematoma. No calvarial fracture or worrisome osseous lesions. Stable benign sessile osteoma of the frontal bone (4/23). Sinuses/Orbits: Paranasal sinuses and mastoid air cells are predominantly clear. Included orbital structures are unremarkable. Other: None. CT CERVICAL SPINE FINDINGS Alignment: Stabilization collar is absent at the time of examination. There is preservation of normal cervical lordosis. Some mild retrolisthesis C4 on C5 is unchanged from comparison. No evidence of traumatic listhesis. No abnormally widened, perched or jumped facets. Normal alignment of the craniocervical and atlantoaxial articulations. Skull base and vertebrae: No acute skull base fracture. No vertebral body fracture or height loss. Normal bone  mineralization. No worrisome osseous lesions. Small vertebral body hemangioma at C7 is unchanged from comparison. Arthrosis at the atlantodental and basion dens interval with some hypertrophic spurring and degenerative ossifications. Multilevel spondylitic changes as detailed below. Soft tissues and spinal canal: No pre or paravertebral fluid or swelling. No visible canal hematoma. Disc levels: Multilevel intervertebral disc height loss with spondylitic endplate changes. Multilevel disc osteophyte complexes are present with some mild multilevel spinal canal stenosis C3-C6 and more moderate central canal stenosis C6-7. Multilevel uncinate spurring and facet hypertrophic changes as well resulting in mild-to-moderate multilevel foraminal narrowing with more moderate to severe narrowing C4-5 and C5-6 bilaterally. Upper chest: No acute abnormality in the upper chest or imaged lung apices. Calcifications of the cervical carotids and proximal great vessels. Other: Normal thyroid gland. IMPRESSION: 1. Images are motion degraded despite multiple attempts at acquisition, may limit detection of subtle anomaly. 2. No convincing evidence of  acute intracranial abnormality. No significant scalp swelling or calvarial fracture. 3. Chronic microvascular angiopathy and parenchymal volume loss. 4. Expected evolution of encephalomalacia in the right corona radiata extending into the white matter tracts corresponding to the infarct seen on comparison MRI. 5. Remote lacunar type infarcts in the bilateral basal ganglia. 6. No evidence of acute fracture or traumatic listhesis of the cervical spine. 7. Multilevel degenerative changes of the cervical spine as described above. 8. Intracranial and cervical atherosclerosis. Electronically Signed   By: Kreg Shropshire M.D.   On: 05/10/2020 22:22   CT Cervical Spine Wo Contrast  Result Date: 05/10/2020 CLINICAL DATA:  Fall EXAM: CT HEAD WITHOUT CONTRAST CT CERVICAL SPINE WITHOUT CONTRAST  TECHNIQUE: Multidetector CT imaging of the head and cervical spine was performed following the standard protocol without intravenous contrast. Multiplanar CT image reconstructions of the cervical spine were also generated. COMPARISON:  CT, CT angiography head and neck, MRI head 04/28/2020 FINDINGS: CT HEAD FINDINGS Brain: Images are motion degraded despite multiple attempts at acquisition, may limit detection of subtle anomaly. Of all vein area of encephalomalacia in the right corona radiata extending into the white matter tracts compatible with an area of infarct seen on recent MRI. Additional bilateral lacunar type infarcts in the basal ganglia. Streak artifact across the temporal lobes and pons/medulla. No convincing evidence of acute infarction, hemorrhage, hydrocephalus, extra-axial collection or mass lesion/mass effect. Symmetric prominence of the ventricles, cisterns and sulci compatible with parenchymal volume loss. Patchy areas of white matter hypoattenuation are most compatible with chronic microvascular angiopathy. Vascular: Atherosclerotic calcification of the carotid siphons and intradural vertebral arteries. No hyperdense vessel. Skull: No significant scalp swelling or hematoma. No calvarial fracture or worrisome osseous lesions. Stable benign sessile osteoma of the frontal bone (4/23). Sinuses/Orbits: Paranasal sinuses and mastoid air cells are predominantly clear. Included orbital structures are unremarkable. Other: None. CT CERVICAL SPINE FINDINGS Alignment: Stabilization collar is absent at the time of examination. There is preservation of normal cervical lordosis. Some mild retrolisthesis C4 on C5 is unchanged from comparison. No evidence of traumatic listhesis. No abnormally widened, perched or jumped facets. Normal alignment of the craniocervical and atlantoaxial articulations. Skull base and vertebrae: No acute skull base fracture. No vertebral body fracture or height loss. Normal bone  mineralization. No worrisome osseous lesions. Small vertebral body hemangioma at C7 is unchanged from comparison. Arthrosis at the atlantodental and basion dens interval with some hypertrophic spurring and degenerative ossifications. Multilevel spondylitic changes as detailed below. Soft tissues and spinal canal: No pre or paravertebral fluid or swelling. No visible canal hematoma. Disc levels: Multilevel intervertebral disc height loss with spondylitic endplate changes. Multilevel disc osteophyte complexes are present with some mild multilevel spinal canal stenosis C3-C6 and more moderate central canal stenosis C6-7. Multilevel uncinate spurring and facet hypertrophic changes as well resulting in mild-to-moderate multilevel foraminal narrowing with more moderate to severe narrowing C4-5 and C5-6 bilaterally. Upper chest: No acute abnormality in the upper chest or imaged lung apices. Calcifications of the cervical carotids and proximal great vessels. Other: Normal thyroid gland. IMPRESSION: 1. Images are motion degraded despite multiple attempts at acquisition, may limit detection of subtle anomaly. 2. No convincing evidence of acute intracranial abnormality. No significant scalp swelling or calvarial fracture. 3. Chronic microvascular angiopathy and parenchymal volume loss. 4. Expected evolution of encephalomalacia in the right corona radiata extending into the white matter tracts corresponding to the infarct seen on comparison MRI. 5. Remote lacunar type infarcts in the bilateral basal ganglia. 6.  No evidence of acute fracture or traumatic listhesis of the cervical spine. 7. Multilevel degenerative changes of the cervical spine as described above. 8. Intracranial and cervical atherosclerosis. Electronically Signed   By: Kreg ShropshirePrice  DeHay M.D.   On: 05/10/2020 22:22   DG Hip Port Unilat With Pelvis 1V Left  Result Date: 05/11/2020 CLINICAL DATA:  Left hip arthroplasty, left hip fracture EXAM: DG HIP (WITH OR WITHOUT  PELVIS) 1V PORT LEFT COMPARISON:  05/10/2020 FINDINGS: Frontal view of the pelvis as well as frontal and cross-table lateral views of the left hip are obtained. Left hip hemiarthroplasty is identified in the expected position without signs of acute complication. Postsurgical changes are seen within the overlying soft tissues. Right hip and the remainder of the bony pelvis are unremarkable. IMPRESSION: 1. Unremarkable left hip hemiarthroplasty. Electronically Signed   By: Sharlet SalinaMichael  Brown M.D.   On: 05/11/2020 19:29   DG Hip Unilat With Pelvis 2-3 Views Left  Result Date: 05/10/2020 CLINICAL DATA:  Larey SeatFell out of bed, left leg deformity EXAM: DG HIP (WITH OR WITHOUT PELVIS) 2-3V LEFT COMPARISON:  None. FINDINGS: Frontal view of the pelvis as well as frontal and cross-table lateral views of the left hip are obtained. There is a subcapital left femoral neck fracture with impaction and varus angulation at the fracture site. No dislocation. The remainder of the bony pelvis is unremarkable. IMPRESSION: 1. Subcapital left femoral neck fracture as above. Electronically Signed   By: Sharlet SalinaMichael  Brown M.D.   On: 05/10/2020 21:54        Scheduled Meds: . acetaminophen  500 mg Oral Q6H  . aspirin EC  81 mg Oral Daily  . atorvastatin  80 mg Oral q1800  . clopidogrel  75 mg Oral Daily  . docusate sodium  100 mg Oral BID  . enoxaparin (LOVENOX) injection  40 mg Subcutaneous Q24H  . escitalopram  10 mg Oral Daily  . ferrous sulfate  325 mg Oral TID PC  . ketorolac  7.5 mg Intravenous Q6H  . midodrine  2.5 mg Oral Q supper  . midodrine  5 mg Oral BID WC  . senna  1 tablet Oral BID  . cyanocobalamin  1,000 mcg Oral Daily   Continuous Infusions: . 0.45 % NaCl with KCl 20 mEq / L 75 mL/hr at 05/11/20 2131     LOS: 1 day   Time spent: 35mins Greater than 50% of this time was spent in counseling, explanation of diagnosis, planning of further management, and coordination of care.  I have personally reviewed and  interpreted on  05/12/2020 daily labs, tele strips, imagings as discussed above under date review session and assessment and plans.  I reviewed all nursing notes, pharmacy notes, consultant notes,  vitals, pertinent old records  I have discussed plan of care as described above with RN , patient and family on 05/12/2020  Voice Recognition /Dragon dictation system was used to create this note, attempts have been made to correct errors. Please contact the author with questions and/or clarifications.   Albertine GratesFang Howie Rufus, MD PhD FACP [facility]  Available via Epic secure chat 7am-7pm for nonurgent issues Please page for urgent issues To page the attending provider between 7A-7P or the covering provider during after hours 7P-7A, please log into the web site [email or web address] and access using universal [place] password for that web site. If you do not have the password, please call the hospital operator.    05/12/2020, 8:46 AM

## 2020-05-12 NOTE — TOC Initial Note (Signed)
Transition of Care [facility]) - Initial/Assessment Note    Patient Details  Name: Kristin Rush MRN: [id number] Date of Birth: 08/05/1941  Transition of Care [facility]) CM/SW Contact:    Lennart Pall, LCSW Phone Number: 05/12/2020, 2:39 PM  Clinical Narrative:  Met with pt and sister, [name], this afternoon to review dc plans.  Pt initially awakens to my voice, but does not engage.  Sister providing all info.  Sister notes that pt lives with her and her spouse.  There are 3 other sisters local as well as pt's ex-spouse who remains very involved and supportive.  They fully intend for pt to return home with them and they have hired a private caregiver 12 hours per day to be additional support.  Sister was able to see pt in PT session today and feels comfortable with family members' ability to provide 24/7 assistance.  They would like for [facility] to resume [facility] services.  Pt already has a w/c, walker, hospital bed and bedside commode.  Sister hopeful she will be cleared for dc by tomorrow.  Continue to follow.          Expected Discharge Plan: [place] Barriers to Discharge: Continued Medical Work up   Patient Goals and CMS Choice Patient states their goals for this hospitalization and ongoing recovery are:: go home   Choice offered to / list presented to : Sibling, Patient  Expected Discharge Plan and Services Expected Discharge Plan: [place] In-house Referral: Clinical Social Work     Living arrangements for the past 2 months: Single Family Home Expected Discharge Date:  (unknown)               DME Arranged: N/A DME Agency: NA                  Prior Living Arrangements/Services Living arrangements for the past 2 months: Single Family Home Lives with:: Siblings Patient language and need for interpreter reviewed:: Yes Do you feel safe going back to the place where you live?: Yes      Need for Family Participation in Patient Care: Yes  (Comment) Care giver support system in place?: Yes (comment) Current home services: DME, Home OT, Home PT Criminal Activity/Legal Involvement Pertinent to Current Situation/Hospitalization: No - Comment as needed  Activities of Daily Living Home Assistive Devices/Equipment: Bedside commode/3-in-1, Other (Comment), Walker (specify type) ADL Screening (condition at time of admission) Patient's cognitive ability adequate to safely complete daily activities?: No Is the patient deaf or have difficulty hearing?: No Does the patient have difficulty seeing, even when wearing glasses/contacts?: No Does the patient have difficulty concentrating, remembering, or making decisions?: Yes Patient able to express need for assistance with ADLs?: Yes Does the patient have difficulty dressing or bathing?: Yes Independently performs ADLs?: No Communication: Independent Is this a change from baseline?: Pre-admission baseline Dressing (OT): Needs assistance Is this a change from baseline?: Pre-admission baseline Grooming: Needs assistance Is this a change from baseline?: Pre-admission baseline Feeding: Needs assistance Is this a change from baseline?: Pre-admission baseline Bathing: Needs assistance Is this a change from baseline?: Pre-admission baseline Toileting: Dependent Is this a change from baseline?: Change from baseline, expected to last >3days In/Out Bed: Dependent Is this a change from baseline?: Change from baseline, expected to last >3 days Walks in Home: Dependent Is this a change from baseline?: Change from baseline, expected to last >3 days Does the patient have difficulty walking or climbing stairs?: Yes Weakness  of Legs: Both Weakness of Arms/Hands: None  Permission Sought/Granted Permission sought to share information with : Family Supports    Share Information with NAME: Angelena Form     Permission granted to share info w Relationship: sister  Permission granted to share info w  Contact Information: [phone]  Emotional Assessment Appearance:: Appears stated age Attitude/Demeanor/Rapport: Unresponsive Affect (typically observed): Quiet Orientation: : Oriented to Self Alcohol / Substance Use: Not Applicable Psych Involvement: No (comment)  Admission diagnosis:  Hip fracture ([place]) [S72.009A] Fall [W19.XXXA] Fracture of femoral neck, left ([place]) [S72.002A] Closed fracture of left hip, initial encounter ([place]) [S72.002A] Fall, initial encounter [W19.XXXA] Patient Active Problem List   Diagnosis Date Noted  . Fracture of femoral neck, left ([place]) 05/11/2020  . Fall at home, initial encounter 05/11/2020  . Chronic diastolic CHF (congestive heart failure) ([place]) 05/11/2020  . 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 Hip fracture ([place]) 05/11/2020  . CVA (cerebral vascular accident) ([place]) 04/29/2020  . Acute CVA (cerebrovascular accident) ([place]) 04/28/2020  . 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 Frequent falls 03/17/2020  . Postural dizziness with presyncope 03/17/2020  . Essential hypertension 03/17/2020  . Right ear pain 03/17/2020  . Excessive cerumen in both ear canals 03/17/2020  . Syncope and collapse 03/17/2020  . 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 Lumbar spondylosis 02/18/2018  . Primary osteoarthritis of both hands 07/11/2017  . Orthostatic hypotension 10/23/2016  . Osteoporosis 04/16/2014  . Migraines 10/09/2011   PCP:  Chesley Noon, MD Pharmacy:   [place], [place] - [address] [place] [address] Phone: [phone] Fax: [place], [employer] [address] [place] [address] Phone: [phone] Fax: [phone]     Social Determinants of Health (SDOH) Interventions    Readmission Risk Interventions Readmission Risk Prevention  Plan 05/12/2020  Transportation Screening Complete  PCP or Specialist Appt within 5-7 Days Complete  Home Care Screening Complete  Medication Review (RN CM) Complete  Some recent data might be hidden

## 2020-05-12 NOTE — Plan of Care (Signed)
  Problem: Activity: Goal: Ability to tolerate increased activity will improve Outcome: Progressing   Problem: Pain Management: Goal: Pain level will decrease with appropriate interventions Outcome: Progressing   

## 2020-05-13 LAB — BASIC METABOLIC PANEL
Anion gap: 7 (ref 5–15)
BUN: 19 mg/dL (ref 8–23)
CO2: 23 mmol/L (ref 22–32)
Calcium: 8 mg/dL — ABNORMAL LOW (ref 8.9–10.3)
Chloride: 105 mmol/L (ref 98–111)
Creatinine, Ser: 0.76 mg/dL (ref 0.44–1.00)
GFR calc Af Amer: >60 mL/min (ref >60)
GFR calc non Af Amer: >60 mL/min (ref >60)
Glucose, Bld: 136 mg/dL — ABNORMAL HIGH (ref 70–99)
Potassium: 4.4 mmol/L (ref 3.5–5.1)
Sodium: 135 mmol/L (ref 135–145)

## 2020-05-13 LAB — CBC
HCT: 24 % — ABNORMAL LOW (ref 36.0–46.0)
Hemoglobin: 7.7 g/dL — ABNORMAL LOW (ref 12.0–15.0)
MCH: 30.7 pg (ref 26.0–34.0)
MCHC: 32.1 g/dL (ref 30.0–36.0)
MCV: 95.6 fL (ref 80.0–100.0)
Platelets: 104 10*3/uL — ABNORMAL LOW (ref 150–400)
RBC: 2.51 MIL/uL — ABNORMAL LOW (ref 3.87–5.11)
RDW: 13.5 % (ref 11.5–15.5)
WBC: 10.6 10*3/uL — ABNORMAL HIGH (ref 4.0–10.5)
nRBC: 0 % (ref 0.0–0.2)

## 2020-05-13 LAB — MAGNESIUM: Magnesium: 1.9 mg/dL (ref 1.7–2.4)

## 2020-05-13 MED ORDER — SODIUM CHLORIDE 0.9 % IV BOLUS
1000.0000 mL | Freq: Once | INTRAVENOUS | Status: AC
  Administered 2020-05-13: 1000 mL via INTRAVENOUS

## 2020-05-13 MED ORDER — ENSURE ENLIVE PO LIQD
237.0000 mL | Freq: Two times a day (BID) | ORAL | Status: DC
  Administered 2020-05-13: 237 mL via ORAL

## 2020-05-13 MED ORDER — POLYETHYLENE GLYCOL 3350 17 G PO PACK
17.0000 g | PACK | Freq: Every day | ORAL | Status: DC
  Administered 2020-05-13 – 2020-05-14 (×2): 17 g via ORAL
  Filled 2020-05-13 (×2): qty 1

## 2020-05-13 NOTE — Progress Notes (Signed)
Occupational Therapy Evaluation  Patient with functional deficits listed below impacting safety and independence with self care. Patient mod A x2 with max cues for sequencing supine to sitting. Patient requires increased processing time and assist with transitioning R hand to walker with sit to stand requiring mod A x2 to power up and stabilize. Patient progress to min A x1 in standing however became dizzy and returned to bed with max A x2 for sit to supine. Patient has extensive support system at home however would like to pursue acute rehab to maximize patient mobility and independence prior to D/C. Will continue to follow.   BP semi-supine 115/45 BP EOB 105/44 BP standing 90/59    05/13/20 1000  OT Visit Information  Last OT Received On 05/13/20  Assistance Needed +2  PT/OT/SLP Co-Evaluation/Treatment Yes  Reason for Co-Treatment Complexity of the patient's impairments (multi-system involvement);For patient/therapist safety;To address functional/ADL transfers  History of Present Illness Patient is 79 y.o. female s/p Lt hemiarthroplasty on 05/11/20 secondary to fall out of bed. PMH history significant for recent stroke (hospitalization 8/18-8/25), CHF, osteoporosis, orthostatic hypotension, HOH.   Precautions  Precautions Fall;Posterior Hip  Precaution Booklet Issued Yes (comment)  Precaution Comments L inattention secondary to recent CVA  Restrictions  Other Position/Activity Restrictions WBAT Lt LE  Home Living  Family/patient expects to be discharged to: Private residence  Living Arrangements Other relatives (sister)  Available Help at Discharge Available 24 hours/day;Family;Personal care attendant  Type of Home House  Home Access Ramped entrance  Home Layout Two level;Able to live on main level with bedroom/bathroom;Full bath on main level  Bathroom Conservation officer, historic buildings Handicapped height  Bathroom Accessibility Yes  How Accessible Accessible via walker   Home Equipment Walker - 2 wheels;Walker - 4 wheels;Cane - single point;Grab bars - toilet;Tub bench;Grab bars - tub/shower;Hand held shower head;BSC;Wheelchair - manual;Walker - standard;Hospital bed  Additional Comments pt lives with her sister and they have hired a caregiver to be with her 7 days/week for ~8-10 hours per day (daytime hrs). Has ~3-4 sisters to help her.    Lives With  (sister)  Prior Function  Level of Independence Needs assistance  Gait / Transfers Assistance Needed pt ambulates with RW and assist from 1 person in home. Has been recieving HHPT due to recent CVA.   ADL's / Homemaking Assistance Needed pt receives assist to dress (bra, socks shoes), sits on shower bench for showers with assist of sisters to wash (she can wash UB), uses BSC at bedside and toilet riser in bathroom with A  Communication  Communication HOH  Pain Assessment  Pain Assessment Faces  Faces Pain Scale 2  Pain Location Lt hip  Pain Descriptors / Indicators Guarding  Pain Intervention(s) Limited activity within patient's tolerance;Premedicated before session  Cognition  Arousal/Alertness Awake/alert  Behavior During Therapy Flat affect  Overall Cognitive Status History of cognitive impairments - at baseline  General Comments patient requires increased processing time, increased difficulty with transitional movements  Upper Extremity Assessment  Upper Extremity Assessment Generalized weakness;LUE deficits/detail  LUE Deficits / Details did not fully assess however note L inattention requiring increased time to initiate mobility with L UE, note rigidity in L UE when donning BP cuff  LUE Coordination decreased fine motor;decreased gross motor  Lower Extremity Assessment  Lower Extremity Assessment Defer to PT evaluation  Cervical / Trunk Assessment  Cervical / Trunk Assessment Kyphotic  ADL  Overall ADL's  Needs assistance/impaired  Grooming Minimal assistance;Bed level  Upper  Body Bathing  Moderate assistance  Lower Body Bathing Total assistance  Upper Body Dressing  Moderate assistance  Lower Body Dressing Total assistance  Toilet Transfer +2 for physical assistance;+2 for safety/equipment  Toilet Transfer Details (indicate cue type and reason) stood EOB with posterior lean, increase in dizziness with standing therefore returned to EOB  Toileting- Clothing Manipulation and Hygiene Total assistance  Functional mobility during ADLs +2 for physical assistance;+2 for safety/equipment;Moderate assistance;Rolling walker;Cueing for safety;Cueing for sequencing  General ADL Comments patient requiring increased assistance with self care due to decreased safety, balance, coordination, activity tolerance, and pain with mobility  Bed Mobility  Overal bed mobility Needs Assistance  Bed Mobility Supine to Sit;Sit to Supine  Supine to sit Mod assist;+2 for physical assistance;+2 for safety/equipment;HOB elevated  Sit to supine Max assist;+2 for physical assistance;+2 for safety/equipment  General bed mobility comments requires max cues for sequencing   Transfers  Overall transfer level Needs assistance  Equipment used Rolling walker (2 wheeled)  Transfers Sit to/from Stand  Sit to Stand Mod assist;+2 physical assistance;+2 safety/equipment;From elevated surface  General transfer comment patient having increased difficulty transitioning R hand onto walker, mod A x2 to power up to standing and stabilize due to initial posterior lean able to progress to min A x1 in static stand however became dizzy therefore returned to bed  Balance  Overall balance assessment Needs assistance  Sitting-balance support Feet supported;Bilateral upper extremity supported  Sitting balance-Leahy Scale Poor  Standing balance support Bilateral upper extremity supported  Standing balance-Leahy Scale Poor  General Comments  General comments (skin integrity, edema, etc.) orthostatic 115/46 semi-supine, 105/44  sitting, 90/59 standing  OT - End of Session  Equipment Utilized During Treatment Gait belt;Rolling walker  Activity Tolerance Treatment limited secondary to medical complications (Comment) (orthostatic)  Patient left in bed;with bed alarm set;with call bell/phone within reach;with family/visitor present  Nurse Communication Mobility status  OT Assessment  OT Recommendation/Assessment Patient needs continued OT Services  OT Visit Diagnosis Unsteadiness on feet (R26.81);Other abnormalities of gait and mobility (R26.89);Muscle weakness (generalized) (M62.81);Pain;Other symptoms and signs involving cognitive function  Pain - Right/Left Left  Pain - part of body Hip  OT Problem List Decreased strength;Decreased range of motion;Impaired balance (sitting and/or standing);Decreased cognition;Decreased coordination;Decreased safety awareness;Decreased activity tolerance;Pain  OT Plan  OT Frequency (ACUTE ONLY) Min 2X/week  OT Treatment/Interventions (ACUTE ONLY) Self-care/ADL training;DME and/or AE instruction;Patient/family education;Balance training;Therapeutic activities  AM-PAC OT "6 Clicks" Daily Activity Outcome Measure (Version 2)  Help from another person eating meals? 3  Help from another person taking care of personal grooming? 3  Help from another person toileting, which includes using toliet, bedpan, or urinal? 1  Help from another person bathing (including washing, rinsing, drying)? 2  Help from another person to put on and taking off regular upper body clothing? 2  Help from another person to put on and taking off regular lower body clothing? 1  6 Click Score 12  OT Recommendation  Recommendations for Other Services Rehab consult  Follow Up Recommendations CIR  OT Equipment None recommended by OT (have neccessary DME per sister)  Individuals Consulted  Consulted and Agree with Results and Recommendations Family member/caregiver  Family Member Consulted sister  Acute Rehab OT  Goals  Patient Stated Goal Regain as much independence asap  OT Goal Formulation With patient/family  Time For Goal Achievement 05/27/20  Potential to Achieve Goals Good  OT Time Calculation  OT Start Time (ACUTE ONLY) 0844  OT Stop Time (  ACUTE ONLY) 0902  OT Time Calculation (min) 18 min  OT General Charges  $OT Visit 1 Visit  OT Evaluation  $OT Eval Low Complexity 1 Low  Written Expression  Dominant Hand Right   Marlyce Huge OT OT pager: [phone]

## 2020-05-13 NOTE — Progress Notes (Signed)
Subjective: 2 Days Post-Op s/p Procedure(s): ARTHROPLASTY BIPOLAR HIP (HEMIARTHROPLASTY)  Pleasantly confused, patient states she is in no pain this morning.   Denies chest pain, SOB, Calf pain, nausea/vomiting. No other complaints.  Objective:  PE: VITALS:   Vitals:   05/12/20 1000 05/12/20 1356 05/12/20 1821 05/12/20 2113  BP: (!) 145/63 (!) 124/52 (!) 108/52 (!) 119/52  Pulse: 72 77 75 73  Resp: 16 16 16 14   Temp: 97.7 F (36.5 C) 97.8 F (36.6 C) 98.2 F (36.8 C) 98.1 F (36.7 C)  TempSrc: Oral Oral Oral Oral  SpO2: 100% 100% 100% 99%  Weight:      Height:        ABD soft Neurovascular intact Sensation intact distally Intact pulses distally Dorsiflexion/Plantar flexion intact Incision: moderate drainage  Moderate ecchymosis seen posterior to the incision site. No significant edema noted in this area.   LABS  Results for orders placed or performed during the hospital encounter of 05/10/20 (from the past 24 hour(s))  Urinalysis, Routine w reflex microscopic Urine, Clean Catch     Status: None   Collection Time: 05/12/20 11:55 AM  Result Value Ref Range   Color, Urine YELLOW YELLOW   APPearance CLEAR CLEAR   Specific Gravity, Urine 1.016 1.005 - 1.030   pH 5.0 5.0 - 8.0   Glucose, UA NEGATIVE NEGATIVE mg/dL   Hgb urine dipstick NEGATIVE NEGATIVE   Bilirubin Urine NEGATIVE NEGATIVE   Ketones, ur NEGATIVE NEGATIVE mg/dL   Protein, ur NEGATIVE NEGATIVE mg/dL   Nitrite NEGATIVE NEGATIVE   Leukocytes,Ua NEGATIVE NEGATIVE  CBC     Status: Abnormal   Collection Time: 05/13/20  3:19 AM  Result Value Ref Range   WBC 10.6 (H) 4.0 - 10.5 K/uL   RBC 2.51 (L) 3.87 - 5.11 MIL/uL   Hemoglobin 7.7 (L) 12.0 - 15.0 g/dL   HCT 07/13/20 (L) 36 - 46 %   MCV 95.6 80.0 - 100.0 fL   MCH 30.7 26.0 - 34.0 pg   MCHC 32.1 30.0 - 36.0 g/dL   RDW 26.9 48.5 - 46.2 %   Platelets 104 (L) 150 - 400 K/uL   nRBC 0.0 0.0 - 0.2 %  Basic metabolic panel     Status: Abnormal    Collection Time: 05/13/20  3:19 AM  Result Value Ref Range   Sodium 135 135 - 145 mmol/L   Potassium 4.4 3.5 - 5.1 mmol/L   Chloride 105 98 - 111 mmol/L   CO2 23 22 - 32 mmol/L   Glucose, Bld 136 (H) 70 - 99 mg/dL   BUN 19 8 - 23 mg/dL   Creatinine, Ser 07/13/20 0.44 - 1.00 mg/dL   Calcium 8.0 (L) 8.9 - 10.3 mg/dL   GFR calc non Af Amer >60 >60 mL/min   GFR calc Af Amer >60 >60 mL/min   Anion gap 7 5 - 15  Magnesium     Status: None   Collection Time: 05/13/20  3:19 AM  Result Value Ref Range   Magnesium 1.9 1.7 - 2.4 mg/dL    DG Hip Port Unilat With Pelvis 1V Left  Result Date: 05/11/2020 CLINICAL DATA:  Left hip arthroplasty, left hip fracture EXAM: DG HIP (WITH OR WITHOUT PELVIS) 1V PORT LEFT COMPARISON:  05/10/2020 FINDINGS: Frontal view of the pelvis as well as frontal and cross-table lateral views of the left hip are obtained. Left hip hemiarthroplasty is identified in the expected position without signs of acute complication. Postsurgical changes  are seen within the overlying soft tissues. Right hip and the remainder of the bony pelvis are unremarkable. IMPRESSION: 1. Unremarkable left hip hemiarthroplasty. Electronically Signed   By: Sharlet Salina M.D.   On: 05/11/2020 19:29    Assessment/Plan: Left femoral neck fracture after fall: 2 Days Post-Op s/p Procedure(s): ARTHROPLASTY BIPOLAR HIP (HEMIARTHROPLASTY) - doing well so far, continue PT  Acute blood loss anemia: - Trending down 9.0 yesterday, 7.7 today - will continue to watch   Weightbearing: WBAT LLE Insicional and dressing care: Reinforce dressings as needed  VTE prophylaxis: Plan to continue current dual antiplatelet therapy of plavix and aspirin, will start Lovenox 40mg  qd once she has finished her Plavix next week for a full 30 days of DVT prophylaxis since hip replacement Pain control:continue current regimen  Follow - up plan: Follow-up with Dr. 1 weeks after discharge Dispo: Plan will be to discharge her  home with sister and HHPT as family has hired a full time caregiver at home as well as 3-4 sisters to help her. Would like to keep at least one more night due to low hemoglobin. Addendum - family would like to pursue CIR as an option, will plan for evaluation for CIR placement   Contact information:   Weekdays 8-5 05-15-2006, Janine Ores [place] A fter hours and holidays please check [email or web address] for group call information for [facility]  [phone] 05/13/2020, 6:29 AM

## 2020-05-13 NOTE — Progress Notes (Signed)
Inpatient Rehab Admissions Coordinator Note:   Per therapy updated recommendations, pt was screened for CIR candidacy by Estill Dooms, PT, DPT.  Based on current payor trends, pt's insurance unlikely to approve a CIR stay with diagnosis of a unilateral hip fracture.  I do not believe she will be a candidate for CIR, however if family is insistent on a referral, please place an order for IP Rehab MD consult.  Please contact me with questions.   Estill Dooms, PT, DPT [phone] 05/13/20 4:33 PM

## 2020-05-13 NOTE — Progress Notes (Signed)
PROGRESS NOTE    Kristin Rush  IYM:[id number] DOB: 02-Nov-1940 DOA: 05/10/2020 PCP: Eartha Inch, MD    Chief Complaint  Patient presents with  . 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 Denies pain anywhere else. States pain is worse with movement and better with rest. - s/p  left hip hemiarthroplasty with Dr. Dion Saucier on August 30 -after discussion with ortho, plan to continue dual antiplatelet therapy with Plavix and aspirin, ortho will start Lovenox 40 mg subcu daily once she is off plavix ( , plavix started on 8/20, last dose suppose to be on 9/10) -Weightbearing as tolerated left lower extremity -Postop pain control per Ortho -Monitor hemoglobin  Post op anemia: hgb 13- 9-7.7 No sign of external bleed Case discussed with ortho, plan to repeat cbc in am   Recent history of acute CVA, right corona radiata infarct 2/2 small vessel ischemic disease -Required hospitalization from August 18 August 25,  --Neurology recommended Asa/Plavix 75 mg p.o. daily for 3 weeks, then asa alone, ( plavix was started on 8/20 per chart review) ----Atorvastatin 80 mg p.o. 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  -contacted neurosurgery per sister request,  -Per Neurosurgery Dr. Franky Macho:  "The simple observation that the ventricles are enlarged is not the same as a diagnosis of hydrocephalus. The gyri and sulci are quite distinct, there is cerebral atrophy present. Also there has been no provocative test showing improvement in her symptoms with spinal fluid removal. There is no connection that I know of between orthostatic hypotension and possible hydrocephalus. 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 The evaluation should be done by neurology, and it is appropriately done as an outpatient"  Failure to thrive: Family report patient has 24/7 care at home, [facility] [place] [facility],   family    DVT prophylaxis: SCDs Start: 05/11/20 1935 SCDs Start: 05/11/20 0536   Code Status: full Family Communication: sister Disposition:   Status is: Inpatient  Dispo: The patient is from: home  Anticipated d/c is to: home with home health , family preference               Anticipated d/c date is: possible tomorrow if hgb stable                Consultants:   Ortho  neurosurgery  Procedures:  s/p  left hip hemiarthroplasty with Dr. Dion Saucier on August  30  Antimicrobials:   Perioperative     Objective: Vitals:   05/12/20 1821 05/12/20 2113 05/13/20 0700 05/13/20 0959  BP: (!) 108/52 (!) 119/52 135/90 125/64  Pulse: 75 73 77 76  Resp: 16 14 14 18   Temp: 98.2 F (36.8 C) 98.1 F (36.7 C) 97.6 F (36.4 C) 97.9 F (36.6 C)  TempSrc: Oral Oral  Oral  SpO2: 100% 99% 100% 97%  Weight:      Height:        Intake/Output Summary (Last 24 hours) at 05/13/2020 1212 Last data filed at 05/13/2020 1000 Gross per 24 hour  Intake 1717.01 ml  Output 325 ml  Net 1392.01 ml   Filed Weights   05/11/20 1445  Weight: 59 kg    Examination:  General exam: more alert, but Frail, confused , NAD Respiratory system: Clear to auscultation. Respiratory effort normal. Cardiovascular system: S1 & S2 heard, RRR. No JVD, no murmur, No pedal edema. Gastrointestinal system: Abdomen is nondistended, soft and nontender. No organomegaly or masses felt. Normal bowel sounds heard. Central nervous system: Alert and oriented. No focal neurological deficits. Extremities: Left hip postop changes, neurovascular intact distally Skin: No rashes, lesions or ulcers Psychiatry: Calm, cooperative    Data Reviewed: I have personally reviewed following labs and imaging studies  CBC: Recent Labs  Lab 05/10/20 2340 05/12/20 0313 05/13/20 0319  WBC 9.2 9.1 10.6*  NEUTROABS 7.7 8.4*  --   HGB 13.0 9.0* 7.7*  HCT 40.0 27.0* 24.0*  MCV 93.7 93.8 95.6  PLT 146* 102* 104*    Basic Metabolic Panel: Recent Labs  Lab 05/10/20 2340 05/12/20 0313 05/13/20 0319  NA 139 134* 135  K 4.3 4.2 4.4  CL 102 103 105  CO2 26 22 23   GLUCOSE 103* 164* 136*  BUN 14 16 19   CREATININE 0.91 0.83 0.76  CALCIUM 9.2 8.0* 8.0*  MG  --  1.9 1.9    GFR: Estimated Creatinine Clearance: 48.7 mL/min (by C-G formula based on SCr of 0.76 mg/dL).  Liver Function Tests: Recent Labs  Lab 05/12/20 0313  AST 21  ALT 20  ALKPHOS 56  BILITOT 0.6  PROT 5.2*  ALBUMIN 3.0*     CBG: No results for input(s): GLUCAP in the last 168 hours.   Recent Results (from the past 240 hour(s))  SARS Coronavirus 2 by RT PCR (hospital order, performed in [facility] hospital lab) Nasopharyngeal Nasopharyngeal Swab     Status: None   Collection Time: 05/11/20  3:30 AM   Specimen: Nasopharyngeal Swab  Result Value Ref Range Status   SARS Coronavirus 2 NEGATIVE NEGATIVE Final    Comment: (NOTE) SARS-CoV-2 target nucleic acids are NOT DETECTED.  The SARS-CoV-2 RNA is generally detectable in upper and lower respiratory specimens during the acute phase of infection. The lowest concentration of SARS-CoV-2 viral copies this assay can detect is 250 copies / mL. A negative result does not preclude SARS-CoV-2 infection and should not be used as the sole basis for treatment or other patient management decisions.  A negative result may occur with improper specimen  collection / handling, submission of specimen other than nasopharyngeal swab, presence of viral mutation(s) within the areas targeted by this assay, and inadequate number of viral copies (<250 copies / mL). A negative result must be combined with clinical observations, patient history, and epidemiological information.  Fact Sheet for Patients:   BoilerBrush.com.cy  Fact Sheet for Healthcare Providers: https://pope.com/  This test is not yet approved or  cleared by the [place] FDA and has been authorized for detection and/or diagnosis of SARS-CoV-2 by FDA under an Emergency Use Authorization (EUA).  This EUA will remain in effect (meaning this test can be used) for the duration of the COVID-19 declaration under Section 564(b)(1) of the Act, 21 U.S.C. section 360bbb-3(b)(1), unless the authorization is terminated or revoked sooner.  Performed at [facility], [address] [place], [place] [address]          Radiology Studies: DG  Hip Port Unilat With Pelvis 1V Left  Result Date: 05/11/2020 CLINICAL DATA:  Left hip arthroplasty, left hip fracture EXAM: DG HIP (WITH OR WITHOUT PELVIS) 1V PORT LEFT COMPARISON:  05/10/2020 FINDINGS: Frontal view of the pelvis as well as frontal and cross-table lateral views of the left hip are obtained. Left hip hemiarthroplasty is identified in the expected position without signs of acute complication. Postsurgical changes are seen within the overlying soft tissues. Right hip and the remainder of the bony pelvis are unremarkable. IMPRESSION: 1. Unremarkable left hip hemiarthroplasty. Electronically Signed   By: Sharlet Salina M.D.   On: 05/11/2020 19:29        Scheduled Meds: . aspirin EC  81 mg Oral Daily  . atorvastatin  80 mg Oral q1800  . clopidogrel  75 mg Oral Daily  . docusate sodium  100 mg Oral BID  . escitalopram  10 mg Oral Daily  . feeding supplement (ENSURE ENLIVE)  237 mL Oral BID BM  . ferrous sulfate  325 mg Oral TID PC  . midodrine  2.5 mg Oral Q supper  . midodrine  5 mg Oral BID WC  . senna  1 tablet Oral BID  . cyanocobalamin  1,000 mcg Oral Daily   Continuous Infusions:    LOS: 2 days   Time spent: Greater than 50% of this time was spent in counseling, explanation of diagnosis, planning of further management, and coordination of care.  I have personally reviewed and interpreted on  05/13/2020 daily labs, tele strips, imagings as discussed above under date review session and assessment and plans.  I reviewed all nursing notes, pharmacy notes, consultant notes,  vitals, pertinent old records  I have discussed plan of care as described above with RN , patient and family on 05/13/2020  Voice Recognition /Dragon dictation system was used to create this note, attempts have been made to correct errors. Please contact the author with questions and/or clarifications.   Albertine Grates, MD PhD FACP [facility]  Available via Epic secure chat 7am-7pm for  nonurgent issues Please page for urgent issues To page the attending provider between 7A-7P or the covering provider during after hours 7P-7A, please log into the web site [email or web address] and access using universal [place] password for that web site. If you do not have the password, please call the hospital operator.    05/13/2020, 12:12 PM

## 2020-05-13 NOTE — Progress Notes (Signed)
Physical Therapy Treatment Patient Details Name: Kristin Rush MRN: [id number] DOB: 27-Oct-1940 Today's Date: 05/13/2020    History of Present Illness Patient is 79 y.o. female s/p Lt hemiarthroplasty on 05/11/20 secondary to fall out of bed. PMH history significant for recent stroke (hospitalization 8/18-8/25), CHF, osteoporosis, orthostatic hypotension, HOH.     PT Comments    Pt in fair spirits and very cooperative but continues to require significant assist of two to safely perform all basic mobility tasks with adherence to posterior THP.  Pt up to standing at side of bed with RW and assist of 2 but unable to progress to ambulation 2* c/o increasing dizziness and drop in BP.  Pt could greatly benefit from follow up rehab at CIR level to maximize IND and safety prior to dc home with family assist.   Follow Up Recommendations  Follow surgeon's recommendation for DC plan and follow-up therapies;CIR     Equipment Recommendations  None recommended by PT    Recommendations for Other Services       Precautions / Restrictions Precautions Precautions: Fall;Posterior Hip Precaution Booklet Issued: Yes (comment) Restrictions Weight Bearing Restrictions: No Other Position/Activity Restrictions: WBAT Lt LE    Mobility  Bed Mobility Overal bed mobility: Needs Assistance Bed Mobility: Supine to Sit;Sit to Supine     Supine to sit: Mod assist;+2 for physical assistance;+2 for safety/equipment;HOB elevated Sit to supine: Max assist;+2 for physical assistance;+2 for safety/equipment   General bed mobility comments: cues for sequence and use of R LE and UEs to self assist with adherence to THP  Transfers Overall transfer level: Needs assistance Equipment used: Rolling walker (2 wheeled) Transfers: Sit to/from Stand Sit to Stand: +2 physical assistance;+2 safety/equipment;Min assist;Mod assist         General transfer comment: EOB elevated and verbal/tactile cues requried for safe  hand placement on RW for stand. Min assist to complete rise and steady. Pt with c/o dizziness and significant drop in BP   Ambulation/Gait             General Gait Details: NT 2* increasing dizziness in standing and drop in BP   Stairs             Wheelchair Mobility    Modified Rankin (Stroke Patients Only)       Balance Overall balance assessment: Needs assistance Sitting-balance support: Feet supported;Bilateral upper extremity supported Sitting balance-Leahy Scale: Fair     Standing balance support: Bilateral upper extremity supported Standing balance-Leahy Scale: Poor Standing balance comment: BUE support needed on RW, min guard                            Cognition Arousal/Alertness: Awake/alert Behavior During Therapy: WFL for tasks assessed/performed;Flat affect Overall Cognitive Status: History of cognitive impairments - at baseline                                 General Comments: Pt with slowed response to all commands, required repeated cues to complete action or perform correct sequencing.      Exercises Total Joint Exercises Ankle Circles/Pumps: AROM;Both;15 reps;Supine Quad Sets: AROM;Both;5 reps;Supine Heel Slides: AAROM;Left;20 reps;Supine Hip ABduction/ADduction: AAROM;Left;15 reps;Supine    General Comments General comments (skin integrity, edema, etc.): Orthostatic      Pertinent Vitals/Pain Pain Assessment: Faces Faces Pain Scale: Hurts a little bit Pain Location: Lt hip Pain Descriptors / Indicators:  Guarding Pain Intervention(s): Limited activity within patient's tolerance;Monitored during session;Premedicated before session;Ice applied    Home Living                      Prior Function            PT Goals (current goals can now be found in the care plan section) Acute Rehab PT Goals Patient Stated Goal: Regain as much independence asap PT Goal Formulation: With patient/family Time For  Goal Achievement: 05/26/20 Potential to Achieve Goals: Good Progress towards PT goals: Progressing toward goals    Frequency           PT Plan Discharge plan needs to be updated    Co-evaluation              AM-PAC PT "6 Clicks" Mobility   Outcome Measure  Help needed turning from your back to your side while in a flat bed without using bedrails?: A Lot Help needed moving from lying on your back to sitting on the side of a flat bed without using bedrails?: A Lot Help needed moving to and from a bed to a chair (including a wheelchair)?: A Lot Help needed standing up from a chair using your arms (e.g., wheelchair or bedside chair)?: A Lot Help needed to walk in hospital room?: Total Help needed climbing 3-5 steps with a railing? : Total 6 Click Score: 10    End of Session Equipment Utilized During Treatment: Gait belt Activity Tolerance: Patient tolerated treatment well;Treatment limited secondary to medical complications (Comment) Patient left: in bed;with call bell/phone within reach;with bed alarm set;with family/visitor present;with SCD's reapplied Nurse Communication: Mobility status PT Visit Diagnosis: Repeated falls (R29.6);Muscle weakness (generalized) (M62.81);Difficulty in walking, not elsewhere classified (R26.2);Unsteadiness on feet (R26.81);Other abnormalities of gait and mobility (R26.89)     Time: 9476-5465 PT Time Calculation (min) (ACUTE ONLY): 39 min  Charges:  $Therapeutic Exercise: 8-22 mins $Therapeutic Activity: 8-22 mins                     Kristin Rush PT Acute Rehabilitation Services Pager [phone] Office [phone]    Kristin Rush 05/13/2020, 9:40 AM

## 2020-05-14 DIAGNOSIS — R627 Adult failure to thrive: Secondary | ICD-10-CM

## 2020-05-14 DIAGNOSIS — I951 Orthostatic hypotension: Secondary | ICD-10-CM

## 2020-05-14 LAB — CBC WITH DIFFERENTIAL/PLATELET
Abs Immature Granulocytes: 0.04 10*3/uL (ref 0.00–0.07)
Basophils Absolute: 0 10*3/uL (ref 0.0–0.1)
Basophils Relative: 1 %
Eosinophils Absolute: 0.4 10*3/uL (ref 0.0–0.5)
Eosinophils Relative: 8 %
HCT: 24.3 % — ABNORMAL LOW (ref 36.0–46.0)
Hemoglobin: 7.8 g/dL — ABNORMAL LOW (ref 12.0–15.0)
Immature Granulocytes: 1 %
Lymphocytes Relative: 26 %
Lymphs Abs: 1.3 10*3/uL (ref 0.7–4.0)
MCH: 31.2 pg (ref 26.0–34.0)
MCHC: 32.1 g/dL (ref 30.0–36.0)
MCV: 97.2 fL (ref 80.0–100.0)
Monocytes Absolute: 0.5 10*3/uL (ref 0.1–1.0)
Monocytes Relative: 9 %
Neutro Abs: 2.8 10*3/uL (ref 1.7–7.7)
Neutrophils Relative %: 55 %
Platelets: 112 10*3/uL — ABNORMAL LOW (ref 150–400)
RBC: 2.5 MIL/uL — ABNORMAL LOW (ref 3.87–5.11)
RDW: 13.9 % (ref 11.5–15.5)
WBC: 4.9 10*3/uL (ref 4.0–10.5)
nRBC: 0.4 % — ABNORMAL HIGH (ref 0.0–0.2)

## 2020-05-14 LAB — TYPE AND SCREEN
ABO/RH(D): A POS
Antibody Screen: NEGATIVE
Unit division: 0
Unit division: 0

## 2020-05-14 LAB — BASIC METABOLIC PANEL
Anion gap: 6 (ref 5–15)
BUN: 15 mg/dL (ref 8–23)
CO2: 26 mmol/L (ref 22–32)
Calcium: 8.1 mg/dL — ABNORMAL LOW (ref 8.9–10.3)
Chloride: 107 mmol/L (ref 98–111)
Creatinine, Ser: 0.74 mg/dL (ref 0.44–1.00)
GFR calc Af Amer: >60 mL/min (ref >60)
GFR calc non Af Amer: >60 mL/min (ref >60)
Glucose, Bld: 92 mg/dL (ref 70–99)
Potassium: 4.1 mmol/L (ref 3.5–5.1)
Sodium: 139 mmol/L (ref 135–145)

## 2020-05-14 LAB — MAGNESIUM: Magnesium: 1.9 mg/dL (ref 1.7–2.4)

## 2020-05-14 LAB — BPAM RBC
Blood Product Expiration Date: 202109192359
Blood Product Expiration Date: 202109192359
Unit Type and Rh: 6200
Unit Type and Rh: 6200

## 2020-05-14 MED ORDER — ENOXAPARIN SODIUM 40 MG/0.4ML ~~LOC~~ SOLN: 40.0000 mg | SUBCUTANEOUS | 0 refills | Status: DC

## 2020-05-14 MED ORDER — NYSTATIN 100000 UNIT/ML MT SUSP: 5.0000 mL | Freq: Four times a day (QID) | OROMUCOSAL | 0 refills | Status: AC

## 2020-05-14 MED ORDER — FERROUS SULFATE 325 (65 FE) MG PO TABS: 325.0000 mg | ORAL_TABLET | Freq: Every day | ORAL | 0 refills | Status: AC

## 2020-05-14 MED ORDER — NYSTATIN 100000 UNIT/ML MT SUSP: 5.0000 mL | Freq: Four times a day (QID) | OROMUCOSAL | Status: DC

## 2020-05-14 MED ORDER — ENSURE ENLIVE PO LIQD: 237.0000 mL | Freq: Two times a day (BID) | ORAL | 12 refills | Status: AC

## 2020-05-14 MED ORDER — FLUDROCORTISONE ACETATE 0.1 MG PO TABS: 0.1000 mg | ORAL_TABLET | Freq: Every day | ORAL | 0 refills | Status: AC

## 2020-05-14 MED ORDER — SODIUM CHLORIDE 1 G PO TABS: 1.0000 g | ORAL_TABLET | Freq: Three times a day (TID) | ORAL | 0 refills | Status: AC

## 2020-05-14 MED ORDER — SENNOSIDES-DOCUSATE SODIUM 8.6-50 MG PO TABS: 1.0000 | ORAL_TABLET | Freq: Every day | ORAL | 0 refills | Status: DC

## 2020-05-14 NOTE — Progress Notes (Signed)
Subjective: 3 Days Post-Op s/p Procedure(s): ARTHROPLASTY BIPOLAR HIP (HEMIARTHROPLASTY)   Patient alert, eating breakfast with sister. Complains of no pain this morning.   Objective:  PE: VITALS:   Vitals:   05/13/20 2139 05/13/20 2144 05/13/20 2148 05/14/20 0508  BP: (!) 133/57 (!) 123/47 107/89 (!) 133/58  Pulse: 79 88 92 82  Resp:    16  Temp: 97.7 F (36.5 C)   98.5 F (36.9 C)  TempSrc: Oral     SpO2: 98%   98%  Weight:      Height:        ABD soft Neurovascular intact Sensation intact distally Intact pulses distally Dorsiflexion/Plantar flexion intact Incision: moderate drainage  Moderate ecchymosis seen posterior to the incision site. No significant edema noted in this area  LABS  Results for orders placed or performed during the hospital encounter of 05/10/20 (from the past 24 hour(s))  CBC with Differential/Platelet     Status: Abnormal   Collection Time: 05/14/20  6:14 AM  Result Value Ref Range   WBC 4.9 4.0 - 10.5 K/uL   RBC 2.50 (L) 3.87 - 5.11 MIL/uL   Hemoglobin 7.8 (L) 12.0 - 15.0 g/dL   HCT 02.6 (L) 36 - 46 %   MCV 97.2 80.0 - 100.0 fL   MCH 31.2 26.0 - 34.0 pg   MCHC 32.1 30.0 - 36.0 g/dL   RDW 37.8 58.8 - 50.2 %   Platelets 112 (L) 150 - 400 K/uL   nRBC 0.4 (H) 0.0 - 0.2 %   Neutrophils Relative % 55 %   Neutro Abs 2.8 1.7 - 7.7 K/uL   Lymphocytes Relative 26 %   Lymphs Abs 1.3 0.7 - 4.0 K/uL   Monocytes Relative 9 %   Monocytes Absolute 0.5 0 - 1 K/uL   Eosinophils Relative 8 %   Eosinophils Absolute 0.4 0 - 0 K/uL   Basophils Relative 1 %   Basophils Absolute 0.0 0 - 0 K/uL   Immature Granulocytes 1 %   Abs Immature Granulocytes 0.04 0.00 - 0.07 K/uL  Basic metabolic panel     Status: Abnormal   Collection Time: 05/14/20  6:14 AM  Result Value Ref Range   Sodium 139 135 - 145 mmol/L   Potassium 4.1 3.5 - 5.1 mmol/L   Chloride 107 98 - 111 mmol/L   CO2 26 22 - 32 mmol/L   Glucose, Bld 92 70 - 99 mg/dL   BUN 15 8 - 23  mg/dL   Creatinine, Ser 7.74 0.44 - 1.00 mg/dL   Calcium 8.1 (L) 8.9 - 10.3 mg/dL   GFR calc non Af Amer >60 >60 mL/min   GFR calc Af Amer >60 >60 mL/min   Anion gap 6 5 - 15  Magnesium     Status: None   Collection Time: 05/14/20  6:14 AM  Result Value Ref Range   Magnesium 1.9 1.7 - 2.4 mg/dL    No results found.  Assessment/Plan: Left femoral neck fracture after fall: 3 Days Post-Op s/p Procedure(s): ARTHROPLASTY BIPOLAR HIP (HEMIARTHROPLASTY)  Acute blood loss anemia: - Hbg stabilized today, 7.8 this morning  Weightbearing:WBATLLE Insicional and dressing care:Reinforce dressings as needed VTE prophylaxis:Plan to continue current dual antiplatelet therapy of plavix and aspirin, will start Lovenox 40mg  qd once she has finished her Plavix next week for a full 30 days of DVT prophylaxis since hip replacement. Pain control:continue current regimen  Follow - up plan:Follow-up with Dr. after discharge Dispo:  Plan will be to discharge her home with sister and HHPT as family has hired a full time caregiver at home as well as 3-4 sisters to help her. Ok to discharge home today from ortho perspective.    Contact information:   Weekdays 8-5 Janine Ores, [place] [phone] A fter hours and holidays please check [email or web address] for group call information for [facility]  Armida Sans 05/14/2020, 8:26 AM

## 2020-05-14 NOTE — TOC Transition Note (Signed)
Transition of Care [facility]) - CM/SW Discharge Note   Patient Details  Name: Lenoria Narine MRN: [id number] Date of Birth: [birth year]  Transition of Care [facility]) CM/SW Contact:  Amada Jupiter, LCSW Phone Number: 05/14/2020, 11:41 AM   Clinical Narrative:    Pt ready for dc today with plan home and family to provide 24/7 assistance.  HH arranged (see below).  Has all needed DME.  No further TOC needs.   Final next level of care: Home w Home Health Services Barriers to Discharge: Barriers Resolved   Patient Goals and CMS Choice Patient states their goals for this hospitalization and ongoing recovery are:: go home   Choice offered to / list presented to : Sibling, Patient  Discharge Placement                       Discharge Plan and Services In-house Referral: Clinical Social Work              DME Arranged: N/A DME Agency: NA       HH Arranged: PT, OT, Nurse's Aide, RN HH Agency: [facility] Date HH Agency Contacted: 05/14/20 Time HH Agency Contacted: 1000 Representative spoke with at [facility] Agency: Amy Hyatt  Social Determinants of Health (SDOH) Interventions     Readmission Risk Interventions Readmission Risk Prevention Plan 05/14/2020 05/12/2020  Transportation Screening Complete Complete  PCP or Specialist Appt within 5-7 Days - Complete  Home Care Screening - Complete  Medication Review (RN CM) - Complete  Social Work Consult for Recovery Care Planning/Counseling Complete -  Palliative Care Screening Not Applicable -  Medication Review Oceanographer) Complete -  Some recent data might be hidden

## 2020-05-14 NOTE — TOC Transition Note (Addendum)
Transition of Care [facility]) - CM/SW Discharge Note   Patient Details  Name: Kristin Rush MRN: [id number] Date of Birth: [birth year]  Transition of Care [facility]) CM/SW Contact:  Clearance Coots, LCSW Phone Number: 05/14/2020, 12:12 PM   Clinical Narrative:    CSW confirm home address for PTAR home.  PTAR arranged for transport, ETA. 1 hour Daughter notified at bedside.  [facility] notified of pt. Discharge today.   Final next level of care: Home w Home Health Services Barriers to Discharge: Barriers Resolved   Patient Goals and CMS Choice Patient states their goals for this hospitalization and ongoing recovery are:: go home   Choice offered to / list presented to : Sibling, Patient  Discharge Placement                Patient to be transferred to facility by: PTAR Name of family member notified: Daughter at bedside Patient and family notified of of transfer: 05/14/20  Discharge Plan and Services In-house Referral: Clinical Social Work              DME Arranged: N/A DME Agency: NA       HH Arranged: PT, OT, Nurse's Aide, RN HH Agency: [facility] Date HH Agency Contacted: 05/14/20 Time HH Agency Contacted: 1000 Representative spoke with at [facility] Agency: Amy Hyatt  Social Determinants of Health (SDOH) Interventions     Readmission Risk Interventions Readmission Risk Prevention Plan 05/14/2020 05/12/2020  Transportation Screening Complete Complete  PCP or Specialist Appt within 5-7 Days - Complete  Home Care Screening - Complete  Medication Review (RN CM) - Complete  Social Work Consult for Recovery Care Planning/Counseling Complete -  Palliative Care Screening Not Applicable -  Medication Review Oceanographer) Complete -  Some recent data might be hidden

## 2020-05-14 NOTE — Care Management Important Message (Signed)
Important Message  Patient Details IM Letter given to the Patient Name: Kristin Rush MRN: [id number] Date of Birth: December 24, 1940   Medicare Important Message Given:  Yes     [name] 05/14/2020, 12:35 PM

## 2020-05-14 NOTE — Discharge Summary (Signed)
Discharge Summary  Kristin Rush ZOX:[id number] DOB: 1941-01-13  PCP: Eartha Inch, MD  Admit date: 05/10/2020 Discharge date: 05/14/2020  Time spent: , more than 50% time spent on coordination of care.  Recommendations for Outpatient Follow-up:  1. F/u with PCP within a week  for hospital discharge follow up, repeat cbc/bmp at follow up. 2. Follow-up with orthopedic Dr. Dion Saucier in 1 week 3. Follow-up with neurology Dr. Pearlean Brownie for stroke and hydrocephalus 4. She also has an appointment with neurosurgery on September 14 5. Family declined SNF placement, elected to go home with home health  Discharge Diagnoses:  Active Hospital Problems   Diagnosis Date Noted  . Fracture of femoral neck, left (HCC) 05/11/2020  . Fall at home, initial encounter 05/11/2020  . Chronic diastolic CHF (congestive heart failure) (HCC) 05/11/2020  . 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 Hip fracture (HCC) 05/11/2020  . Essential hypertension 03/17/2020    Resolved Hospital Problems  No resolved problems to display.    Discharge Condition: stable  Diet recommendation: pured, honey thickened liquid diet; meds crushed with pure  Filed Weights   05/11/20 1445  Weight: 59 kg    History of present illness:( Per admitting MD Dr. Leafy Half) 79 year old female with past medical history of recent stroke (right corona radiata infarct) hospitalized from 8/18 to 05/05/2020, orthostatic hypotension, chronic diastolic congestive heart failure (EF 60-65% 03/2020), chronic hydrocephalus who presents to [facility] emergency department with left hip pain status post fall.  Patient explains that yesterday evening she was sitting on the side of the bed when she suddenly slipped off of her bed, falling on her left hip.  This resulted in sudden onset of left hip pain, severe in intensity, sharp in quality, nonradiating.  Pain is worse with movement of  the affected extremity and improved with rest.  Family came to the patient's aid although none of them witnessed the fall.  Both patient and family deny that the patient lost consciousness during this event.  Patient was brought in to [facility] emergency department for evaluation.  Upon evaluation in the emergency department, trauma survey of the cervical spine and head revealed no acute disease.  X-ray of the pelvis did reveal evidence of a left femoral neck fracture however.  Case was discussed with Dr. Dion Saucier who recommended that the patient be kept n.p.o. in preparation for possible surgical intervention on 8/31.   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 Denies pain anywhere else. States pain is worse with movement and better with rest. - s/p  left hip hemiarthroplasty with Dr. Dion Saucier on August 30 -after discussion with ortho, plan to continue dual antiplatelet therapy with Plavix and aspirin due to recent CVA, ortho will start Lovenox 40 mg subcu daily once she is off plavix ( plavix started on 8/20, last dose suppose to be on 9/10) -Weightbearing as tolerated left lower extremity -Postop pain control per Ortho   Post op anemia: hgb 13- 9-7.7-7.8 No sign of external bleed Started on  Iron supplement  Orthopedic cleared her to discharge home  Acute thrombocytopenia -Likely reactive due to postop anemia -Platelet nadir at 102, improving ,platelet 09/11/2010 at discharge -PCP to repeat CBC at hospital discharge follow-up    Recent history of acute CVA,  right corona radiata infarct 2/2 small vessel ischemic disease (MRI on 04/28/2020) -Required hospitalization from August 18 August 25,  --Neurology recommended Asa/Plavix 75 mg p.o. daily for 3 weeks, then asa alone, ( plavix was started on 8/20 per chart review) ----Atorvastatin 80 mg p.o. 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  Hydrocephalus, chronic -Patient has followed with [facility] neurology in [place] in the past, apparently was instructed that she needs a shunt 2 years ago but patient declines. 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  -contacted neurosurgery per sister's request,  -Per Neurosurgery Dr. Franky Macho:  "The simple observation that the ventricles are enlarged is not the same as a diagnosis of hydrocephalus. The gyri and sulci are quite distinct, there is cerebral atrophy present. Also there has been no provocative test showing improvement in her symptoms with spinal fluid removal. There is no connection that I know of between orthostatic hypotension and possible hydrocephalus. 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 The evaluation should be done by neurology, and it is appropriately done as an outpatient"  Failure to thrive: Family report patient has 24/7 care at home, [facility],    family  declined SNF placement, elected to go home with home health.  In addition to home health, family has hired a full time caregiver at home as well as 3-4 sisters to help her.  Dark tongue: -Maybe from iron supplement, cannot rule out oral candidiasis -start  topical nystatin , encourage hydration -Follow-up with PCP  Code Status: full Family Communication: sister Disposition:   Status is: Inpatient  Dispo: The patient is from: home   Anticipated d/c is to: home with home health , family preference      Consultants:   Ortho Dr. Dion Saucier  Neurosurgery Dr. Franky Macho  Procedures:  s/p  left hip hemiarthroplasty with Dr. Dion Saucier on August 30  Antimicrobials:   Perioperative ancef   Discharge Exam: BP (!) 133/58 (BP Location: Right Arm)   Pulse 82   Temp 98.5 F (36.9 C)   Resp 16   Ht 5\' 3"  (1.6 m)   Wt 59 kg   SpO2 98%   BMI 23.04 kg/m   General: Frail, confused, NAD Cardiovascular: RRR Respiratory: CTA BL Extremities: Left hip postop changes, neurovascular intact distally  Discharge Instructions You were cared for by a hospitalist during your hospital stay. If you have any questions about your discharge medications or the care you received while you were in the hospital after you are discharged, you can call the unit and asked to speak with the hospitalist on call if the hospitalist that took care of you is not  available. Once you are discharged, your primary care physician will handle any further medical issues. Please note that NO REFILLS for any discharge medications will be authorized once you are discharged, as it is imperative that you return to your primary care physician (or establish a relationship with a primary care physician if you do not have one) for your aftercare needs so that they can reassess your need for medications and monitor your lab values.  Discharge Instructions    Ambulatory referral to  Neurology   Complete by: As directed    An appointment is requested in approximately: 2 weeks Stroke follow up  And hydrocephalus  eval and management   Diet general   Complete by: As directed    pured, honey thickened liquid diet; meds crushed with pure Sit up at 90 degree when eating, remain at 90 degree for at least 30mins after eating   Discharge wound care:   Complete by: As directed    Reinforce left hip dressing   Increase activity slowly   Complete by: As directed      Allergies as of 05/14/2020      Reactions   Codeine Nausea And Vomiting   Sulfa Antibiotics Other (See Comments)   unk      Medication List    STOP taking these medications   midodrine 2.5 MG tablet Commonly known as: PROAMATINE   midodrine 5 MG tablet Commonly known as: PROAMATINE     TAKE these medications   acetaminophen 500 MG tablet Commonly known as: TYLENOL Take 500 mg by mouth daily as needed for mild pain.   ALPRAZolam 0.25 MG tablet Commonly known as: XANAX Take 1 tablet (0.25 mg total) by mouth 2 (two) times daily as needed for anxiety.   aspirin 81 MG EC tablet Take 1 tablet (81 mg total) by mouth daily. Swallow whole.   atorvastatin 80 MG tablet Commonly known as: LIPITOR Take 1 tablet (80 mg total) by mouth daily at 6 PM.   calcium-vitamin D 500-200 MG-UNIT tablet Commonly known as: OSCAL WITH D Take 1 tablet by mouth daily.   clopidogrel 75 MG tablet Commonly known as: PLAVIX Take 1 tablet (75 mg total) by mouth daily.   cyanocobalamin 1000 MCG tablet Take 1,000 mcg by mouth daily.   enoxaparin 40 MG/0.4ML injection Commonly known as: Lovenox Inject 0.4 mLs (40 mg total) into the skin daily. This should be started the day after her last Plavix dose. She will need lovenox daily for 30 days after her surgery and then it can be discontinued   escitalopram 10 MG tablet Commonly known as: LEXAPRO Take 10 mg by mouth daily.   feeding supplement (ENSURE ENLIVE)  Liqd Take 237 mLs by mouth 2 (two) times daily between meals.   ferrous sulfate 325 (65 FE) MG tablet Take 1 tablet (325 mg total) by mouth daily with breakfast.   fludrocortisone 0.1 MG tablet Commonly known as: FLORINEF Take 1 tablet (0.1 mg total) by mouth daily.   nystatin 100000 UNIT/ML suspension Commonly known as: MYCOSTATIN Take 5 mLs (500,000 Units total) by mouth 4 (four) times daily.   polyethylene glycol 17 g packet Commonly known as: MIRALAX / GLYCOLAX Take 17 g by mouth daily as needed for mild constipation.   PreserVision AREDS Tabs Take 1 tablet by mouth daily.   Resource ThickenUp Clear Powd To make liquids honey thick   senna-docusate 8.6-50 MG tablet Commonly known as: Senokot-S Take 1 tablet by mouth at bedtime.  sodium chloride 1 g tablet Take 1 tablet (1 g total) by mouth 3 (three) times daily.            Discharge Care Instructions  (From admission, onward)         Start     Ordered   05/12/20 0000  Discharge wound care:       Comments: Reinforce left hip dressing   05/12/20 1352         Allergies  Allergen Reactions  . Codeine Nausea And Vomiting  . Sulfa Antibiotics Other (See Comments)    unk    Follow-up Information    Teryl Lucy, MD. Schedule an appointment as soon as possible for a visit in 1 week(s).   Specialty: Orthopedic Surgery Contact information: [address] [place] [address] [phone]        Eartha Inch, MD Follow up in 1 week(s).   Specialty: Family Medicine Why: Hospital discharge follow-up, repeat CBC BMP at follow-up Contact information: [address] [place] [address] [phone]        Micki Riley, MD Follow up in 3 week(s).   Specialties: Neurology, Radiology Why: stroke follow up hydrocephalus follow up Contact information: [address] [place] [address] [phone]                The results of significant  diagnostics from this hospitalization (including imaging, microbiology, ancillary and laboratory) are listed below for reference.    Significant Diagnostic Studies: EEG  Result Date: 04/29/2020 Charlsie Quest, MD     04/29/2020  2:10 PM Patient Name: Bronda Alfred MRN: [id number] Epilepsy Attending: Charlsie Quest Referring Physician/Provider: Dr. Syliva Overman Date: 04/29/2020 Duration: 24.27 minutes Patient history: 79 year old female with episodes of staring off and inability to move briefly.  EEG to evaluate for seizures. Level of alertness: Awake AEDs during EEG study: None Technical aspects: This EEG study was done with scalp electrodes positioned according to the 10-20 International system of electrode placement. Electrical activity was acquired at a sampling rate of 500Hz  and reviewed with a high frequency filter of 70Hz  and a low frequency filter of 1Hz . EEG data were recorded continuously and digitally stored. Description: No posterior dominant rhythm was seen.  EEG showed continuous generalized polymorphic 5 to 6 Hz theta slowing as well as intermittent generalized 2 to 3 Hz delta slowing.  Hyperventilation and photic stimulation were not performed.   ABNORMALITY - Continuous slow, generalized IMPRESSION: This study is suggestive of moderate diffuse encephalopathy, nonspecific to etiology.  No seizures or epileptiform discharges were seen throughout the recording. Charlsie Quest   CT Code Stroke CTA Head W/WO contrast  Result Date: 04/28/2020 CLINICAL DATA:  Focal neuro deficit greater than 6 hours. Isolated facial droop. EXAM: CT ANGIOGRAPHY HEAD AND NECK TECHNIQUE: Multidetector CT imaging of the head and neck was performed using the standard protocol during bolus administration of intravenous contrast. Multiplanar CT image reconstructions and MIPs were obtained to evaluate the vascular anatomy. Carotid stenosis measurements (when applicable) are obtained utilizing NASCET criteria,  using the distal internal carotid diameter as the denominator. CONTRAST:  50mL OMNIPAQUE IOHEXOL 350 MG/ML SOLN COMPARISON:  CT head 04/28/2020.  MRI head 04/03/2020 FINDINGS: CTA NECK FINDINGS Aortic arch: Mild atherosclerotic calcification aortic arch. Bovine branching pattern. Proximal great vessels widely patent. Right carotid system: Mild noncalcified plaque in the right common carotid artery artery. Atherosclerotic calcification in the right carotid bulb with approximately 25%  diameter stenosis of the right internal carotid artery. Right external carotid artery widely patent. Left carotid system: Left common carotid artery widely patent. Mild atherosclerotic calcification left carotid bifurcation without significant stenosis. Vertebral arteries: Right vertebral artery dominant and patent to the basilar without stenosis. Small left vertebral artery which is hypoplastic distal to PICA. Small contribution to the basilar. Skeleton: Cervical spondylosis.  No acute skeletal abnormality. Other neck: Negative for mass or adenopathy Upper chest: Apical pleural scarring bilaterally. Lung apices clear bilaterally. Review of the MIP images confirms the above findings CTA HEAD FINDINGS Anterior circulation: Atherosclerotic calcification in the cavernous carotid bilaterally without stenosis. Anterior and middle cerebral arteries patent bilaterally without large vessel occlusion. Mild atherosclerotic irregularity in the M3 branches bilaterally Posterior circulation: Both vertebral arteries contribute to the basilar with right vertebral artery dominant. PICA patent bilaterally. Basilar widely patent. Superior cerebellar and posterior cerebral arteries patent bilaterally. Fetal origin right posterior cerebral artery. Venous sinuses: Minimal venous contrast due to arterial phase scanning Anatomic variants: None Review of the MIP images confirms the above findings IMPRESSION: 1. No significant carotid or vertebral artery  stenosis in the neck. Mild atherosclerotic disease. 2. Negative for intracranial large vessel occlusion 3. Mild atherosclerotic irregularity in the M3 branches bilaterally. Electronically Signed   By: Marlan Palau M.D.   On: 04/28/2020 17:14   DG Chest 1 View  Result Date: 05/10/2020 CLINICAL DATA:  Larey Seat out of bed, left hip fracture EXAM: CHEST  1 VIEW COMPARISON:  None. FINDINGS: Single frontal view of the chest demonstrates an unremarkable cardiac silhouette. No airspace disease, effusion, or pneumothorax. Calcified pleural plaque at the left apex. No acute bony abnormalities. IMPRESSION: 1. No acute intrathoracic process. Electronically Signed   By: Sharlet Salina M.D.   On: 05/10/2020 21:54   CT Head Wo Contrast  Result Date: 05/10/2020 CLINICAL DATA:  Fall EXAM: CT HEAD WITHOUT CONTRAST CT CERVICAL SPINE WITHOUT CONTRAST TECHNIQUE: Multidetector CT imaging of the head and cervical spine was performed following the standard protocol without intravenous contrast. Multiplanar CT image reconstructions of the cervical spine were also generated. COMPARISON:  CT, CT angiography head and neck, MRI head 04/28/2020 FINDINGS: CT HEAD FINDINGS Brain: Images are motion degraded despite multiple attempts at acquisition, may limit detection of subtle anomaly. Of all vein area of encephalomalacia in the right corona radiata extending into the white matter tracts compatible with an area of infarct seen on recent MRI. Additional bilateral lacunar type infarcts in the basal ganglia. Streak artifact across the temporal lobes and pons/medulla. No convincing evidence of acute infarction, hemorrhage, hydrocephalus, extra-axial collection or mass lesion/mass effect. Symmetric prominence of the ventricles, cisterns and sulci compatible with parenchymal volume loss. Patchy areas of white matter hypoattenuation are most compatible with chronic microvascular angiopathy. Vascular: Atherosclerotic calcification of the carotid  siphons and intradural vertebral arteries. No hyperdense vessel. Skull: No significant scalp swelling or hematoma. No calvarial fracture or worrisome osseous lesions. Stable benign sessile osteoma of the frontal bone (4/23). Sinuses/Orbits: Paranasal sinuses and mastoid air cells are predominantly clear. Included orbital structures are unremarkable. Other: None. CT CERVICAL SPINE FINDINGS Alignment: Stabilization collar is absent at the time of examination. There is preservation of normal cervical lordosis. Some mild retrolisthesis C4 on C5 is unchanged from comparison. No evidence of traumatic listhesis. No abnormally widened, perched or jumped facets. Normal alignment of the craniocervical and atlantoaxial articulations. Skull base and vertebrae: No acute skull base fracture. No vertebral body fracture or height loss. Normal bone mineralization.  No worrisome osseous lesions. Small vertebral body hemangioma at C7 is unchanged from comparison. Arthrosis at the atlantodental and basion dens interval with some hypertrophic spurring and degenerative ossifications. Multilevel spondylitic changes as detailed below. Soft tissues and spinal canal: No pre or paravertebral fluid or swelling. No visible canal hematoma. Disc levels: Multilevel intervertebral disc height loss with spondylitic endplate changes. Multilevel disc osteophyte complexes are present with some mild multilevel spinal canal stenosis C3-C6 and more moderate central canal stenosis C6-7. Multilevel uncinate spurring and facet hypertrophic changes as well resulting in mild-to-moderate multilevel foraminal narrowing with more moderate to severe narrowing C4-5 and C5-6 bilaterally. Upper chest: No acute abnormality in the upper chest or imaged lung apices. Calcifications of the cervical carotids and proximal great vessels. Other: Normal thyroid gland. IMPRESSION: 1. Images are motion degraded despite multiple attempts at acquisition, may limit detection of  subtle anomaly. 2. No convincing evidence of acute intracranial abnormality. No significant scalp swelling or calvarial fracture. 3. Chronic microvascular angiopathy and parenchymal volume loss. 4. Expected evolution of encephalomalacia in the right corona radiata extending into the white matter tracts corresponding to the infarct seen on comparison MRI. 5. Remote lacunar type infarcts in the bilateral basal ganglia. 6. No evidence of acute fracture or traumatic listhesis of the cervical spine. 7. Multilevel degenerative changes of the cervical spine as described above. 8. Intracranial and cervical atherosclerosis. Electronically Signed   By: Kreg Shropshire M.D.   On: 05/10/2020 22:22   CT Code Stroke CTA Neck W/WO contrast  Result Date: 04/28/2020 CLINICAL DATA:  Focal neuro deficit greater than 6 hours. Isolated facial droop. EXAM: CT ANGIOGRAPHY HEAD AND NECK TECHNIQUE: Multidetector CT imaging of the head and neck was performed using the standard protocol during bolus administration of intravenous contrast. Multiplanar CT image reconstructions and MIPs were obtained to evaluate the vascular anatomy. Carotid stenosis measurements (when applicable) are obtained utilizing NASCET criteria, using the distal internal carotid diameter as the denominator. CONTRAST:  50mL OMNIPAQUE IOHEXOL 350 MG/ML SOLN COMPARISON:  CT head 04/28/2020.  MRI head 04/03/2020 FINDINGS: CTA NECK FINDINGS Aortic arch: Mild atherosclerotic calcification aortic arch. Bovine branching pattern. Proximal great vessels widely patent. Right carotid system: Mild noncalcified plaque in the right common carotid artery artery. Atherosclerotic calcification in the right carotid bulb with approximately 25% diameter stenosis of the right internal carotid artery. Right external carotid artery widely patent. Left carotid system: Left common carotid artery widely patent. Mild atherosclerotic calcification left carotid bifurcation without significant  stenosis. Vertebral arteries: Right vertebral artery dominant and patent to the basilar without stenosis. Small left vertebral artery which is hypoplastic distal to PICA. Small contribution to the basilar. Skeleton: Cervical spondylosis.  No acute skeletal abnormality. Other neck: Negative for mass or adenopathy Upper chest: Apical pleural scarring bilaterally. Lung apices clear bilaterally. Review of the MIP images confirms the above findings CTA HEAD FINDINGS Anterior circulation: Atherosclerotic calcification in the cavernous carotid bilaterally without stenosis. Anterior and middle cerebral arteries patent bilaterally without large vessel occlusion. Mild atherosclerotic irregularity in the M3 branches bilaterally Posterior circulation: Both vertebral arteries contribute to the basilar with right vertebral artery dominant. PICA patent bilaterally. Basilar widely patent. Superior cerebellar and posterior cerebral arteries patent bilaterally. Fetal origin right posterior cerebral artery. Venous sinuses: Minimal venous contrast due to arterial phase scanning Anatomic variants: None Review of the MIP images confirms the above findings IMPRESSION: 1. No significant carotid or vertebral artery stenosis in the neck. Mild atherosclerotic disease. 2. Negative for intracranial large  vessel occlusion 3. Mild atherosclerotic irregularity in the M3 branches bilaterally. Electronically Signed   By: Marlan Palau M.D.   On: 04/28/2020 17:14   CT Cervical Spine Wo Contrast  Result Date: 05/10/2020 CLINICAL DATA:  Fall EXAM: CT HEAD WITHOUT CONTRAST CT CERVICAL SPINE WITHOUT CONTRAST TECHNIQUE: Multidetector CT imaging of the head and cervical spine was performed following the standard protocol without intravenous contrast. Multiplanar CT image reconstructions of the cervical spine were also generated. COMPARISON:  CT, CT angiography head and neck, MRI head 04/28/2020 FINDINGS: CT HEAD FINDINGS Brain: Images are motion  degraded despite multiple attempts at acquisition, may limit detection of subtle anomaly. Of all vein area of encephalomalacia in the right corona radiata extending into the white matter tracts compatible with an area of infarct seen on recent MRI. Additional bilateral lacunar type infarcts in the basal ganglia. Streak artifact across the temporal lobes and pons/medulla. No convincing evidence of acute infarction, hemorrhage, hydrocephalus, extra-axial collection or mass lesion/mass effect. Symmetric prominence of the ventricles, cisterns and sulci compatible with parenchymal volume loss. Patchy areas of white matter hypoattenuation are most compatible with chronic microvascular angiopathy. Vascular: Atherosclerotic calcification of the carotid siphons and intradural vertebral arteries. No hyperdense vessel. Skull: No significant scalp swelling or hematoma. No calvarial fracture or worrisome osseous lesions. Stable benign sessile osteoma of the frontal bone (4/23). Sinuses/Orbits: Paranasal sinuses and mastoid air cells are predominantly clear. Included orbital structures are unremarkable. Other: None. CT CERVICAL SPINE FINDINGS Alignment: Stabilization collar is absent at the time of examination. There is preservation of normal cervical lordosis. Some mild retrolisthesis C4 on C5 is unchanged from comparison. No evidence of traumatic listhesis. No abnormally widened, perched or jumped facets. Normal alignment of the craniocervical and atlantoaxial articulations. Skull base and vertebrae: No acute skull base fracture. No vertebral body fracture or height loss. Normal bone mineralization. No worrisome osseous lesions. Small vertebral body hemangioma at C7 is unchanged from comparison. Arthrosis at the atlantodental and basion dens interval with some hypertrophic spurring and degenerative ossifications. Multilevel spondylitic changes as detailed below. Soft tissues and spinal canal: No pre or paravertebral fluid or  swelling. No visible canal hematoma. Disc levels: Multilevel intervertebral disc height loss with spondylitic endplate changes. Multilevel disc osteophyte complexes are present with some mild multilevel spinal canal stenosis C3-C6 and more moderate central canal stenosis C6-7. Multilevel uncinate spurring and facet hypertrophic changes as well resulting in mild-to-moderate multilevel foraminal narrowing with more moderate to severe narrowing C4-5 and C5-6 bilaterally. Upper chest: No acute abnormality in the upper chest or imaged lung apices. Calcifications of the cervical carotids and proximal great vessels. Other: Normal thyroid gland. IMPRESSION: 1. Images are motion degraded despite multiple attempts at acquisition, may limit detection of subtle anomaly. 2. No convincing evidence of acute intracranial abnormality. No significant scalp swelling or calvarial fracture. 3. Chronic microvascular angiopathy and parenchymal volume loss. 4. Expected evolution of encephalomalacia in the right corona radiata extending into the white matter tracts corresponding to the infarct seen on comparison MRI. 5. Remote lacunar type infarcts in the bilateral basal ganglia. 6. No evidence of acute fracture or traumatic listhesis of the cervical spine. 7. Multilevel degenerative changes of the cervical spine as described above. 8. Intracranial and cervical atherosclerosis. Electronically Signed   By: Kreg Shropshire M.D.   On: 05/10/2020 22:22   MR BRAIN WO CONTRAST  Result Date: 04/28/2020 CLINICAL DATA:  Acute neuro deficit. EXAM: MRI HEAD WITHOUT CONTRAST TECHNIQUE: Multiplanar, multiecho pulse sequences of the  brain and surrounding structures were obtained without intravenous contrast. COMPARISON:  MRI head 04/03/2020.  CT angio head and neck 04/28/2020 FINDINGS: Brain: Acute infarct in the right Corona radiata extending into the deep white matter tracks. No other area of acute infarct. Mild chronic microvascular ischemic change  in the white matter Mild to moderate ventricular enlargement is unchanged. Ventricular enlargement is greater than the level of atrophy suggesting hydrocephalus. Third fourth and lateral ventricles all dilated. Negative for intracranial hemorrhage or mass lesion. Vascular: Normal arterial flow voids. Skull and upper cervical spine: No focal skeletal lesion. Sinuses/Orbits: Paranasal sinuses clear.  Negative orbit. Other: Multilocular cyst anterior to the C2 vertebral body on the left measuring approximately 24 x 8 mm. This appears to be within the longus coli muscle on the left. No change from the recent MRI. IMPRESSION: Acute infarct in the right corona radiata and deep white matter tracks. Mild to moderate ventricular enlargement compatible with hydrocephalus. 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 Ventricle size stable since the recent MRI of 04/03/2020. 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 Uncertain etiology. Possible chronic tendonitis. Electronically Signed   By: Marlan Palau M.D.   On: 04/28/2020 18:39   DG Swallowing Func-Speech Pathology  Result Date: 04/29/2020 Objective Swallowing Evaluation: Type of Study: Bedside Swallow Evaluation  Patient Details Name: Sereniti Wan MRN: [id number] Date of Birth: 1940-11-05 Today's Date: 04/29/2020 Time: SLP Start Time (ACUTE ONLY): 1000 -SLP Stop Time (ACUTE ONLY): 1025 SLP Time Calculation (min) (ACUTE ONLY): 25 min Past Medical History: Past Medical History: Diagnosis Date . 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 Lumbar spondylosis 02/18/2018 . Migraine  . Migraines 10/09/2011 . Orthostatic hypotension 10/23/2016 . Osteoporosis 04/16/2014 . Primary osteoarthritis of both hands 07/11/2017 Past Surgical History: No past surgical history on file. HPI: 79 y.o. female with history of recently admission for recurrent syncope with orthostatic hypotension, CHF was brought to the  ER after patient was found to have a left facial droop noticed around 3 PM.  Patient as per the patient's sister has not been doing well for the last couple of days with difficulty ambulating.  Has been found to have frequent episodes of low blood pressure. MRI of head revealed Acute infarct in the right corona radiata and deep white matter  Subjective: upright in chair for procedure Assessment / Plan / Recommendation CHL IP CLINICAL IMPRESSIONS 04/29/2020 Clinical Impression Novel moderate oropharyngeal dysphagia  exhibited with sensory and motor impairments s/p right corona radiata territory CVA. Left sided oral motor deficits included decreased lingual, labial, facial strength allowing for intermittent left sided anterior spillage, delayed AP transit, oral residuals, prolonged mastication of solid POs, and stasis/lack of propulsion of barium tablet; this was subsequently extracted. Pharyngeal deficits marked by reduced timing and effiiency of laryngeal vestibule closure, incomplete epiglottic deflection, weakned pharyngeal stripping wave, reduced base of tongue retraction, decreased laryngeal elevation, and diminished sensation. Pt with frequent pre and during the swallow penetration of thin, nectar thick liquids and episodic aspiration of thin and nectar thick liquids (inconsisently sensed). Trace penetration and trace aspiration (clearing post swallow) exhibited with honey thick liquids. Mild to moderate pharyngeal residuals noted with puree, solid PO. Recommend conservative dysphagia 1 (puree) puree, honey thick liquids with medicines crushed and safe swallowing precauitons including full supervsion. Pt with waxing/waning mentation. SLP to follow up.  SLP Visit Diagnosis Dysphagia, oropharyngeal phase (R13.12) Attention and concentration deficit following -- Frontal lobe and executive function deficit  following -- Impact on safety and function Moderate aspiration risk;Severe aspiration risk   CHL IP TREATMENT  RECOMMENDATION 04/29/2020 Treatment Recommendations Therapy as outlined in treatment plan below   Prognosis 04/29/2020 Prognosis for Safe Diet Advancement Good Barriers to Reach Goals Time post onset;Severity of deficits Barriers/Prognosis Comment -- CHL IP DIET RECOMMENDATION 04/29/2020 SLP Diet Recommendations Dysphagia 1 (Puree) solids;Honey thick liquids Liquid Administration via Cup Medication Administration Crushed with puree Compensations Slow rate;Small sips/bites;Effortful swallow;Multiple dry swallows after each bite/sip;Lingual sweep for clearance of pocketing;Clear throat after each swallow Postural Changes Seated upright at 90 degrees;Remain semi-upright after after feeds/meals (Comment)   CHL IP OTHER RECOMMENDATIONS 04/29/2020 Recommended Consults -- Oral Care Recommendations Oral care BID Other Recommendations Order thickener from pharmacy   CHL IP FOLLOW UP RECOMMENDATIONS 04/29/2020 Follow up Recommendations Other (comment)   CHL IP FREQUENCY AND DURATION 04/29/2020 Speech Therapy Frequency (ACUTE ONLY) min 2x/week Treatment Duration 1 week      CHL IP ORAL PHASE 04/29/2020 Oral Phase Impaired Oral - Pudding Teaspoon -- Oral - Pudding Cup -- Oral - Honey Teaspoon -- Oral - Honey Cup Weak lingual manipulation;Left anterior bolus loss;Incomplete tongue to palate contact;Reduced posterior propulsion;Holding of bolus;Lingual/palatal residue;Delayed oral transit Oral - Nectar Teaspoon -- Oral - Nectar Cup Left anterior bolus loss;Weak lingual manipulation;Incomplete tongue to palate contact;Left pocketing in lateral sulci;Lingual/palatal residue;Delayed oral transit;Holding of bolus;Reduced posterior propulsion Oral - Nectar Straw -- Oral - Thin Teaspoon -- Oral - Thin Cup Delayed oral transit;Weak lingual manipulation;Lingual/palatal residue Oral - Thin Straw Weak lingual manipulation;Lingual/palatal residue Oral - Puree Delayed oral transit;Weak lingual manipulation;Reduced posterior  propulsion;Lingual/palatal residue Oral - Mech Soft Delayed oral transit;Piecemeal swallowing;Lingual/palatal residue;Reduced posterior propulsion;Left pocketing in lateral sulci;Impaired mastication;Weak lingual manipulation;Decreased bolus cohesion Oral - Regular -- Oral - Multi-Consistency -- Oral - Pill Other (Comment);Holding of bolus Oral Phase - Comment --  CHL IP PHARYNGEAL PHASE 04/29/2020 Pharyngeal Phase Impaired Pharyngeal- Pudding Teaspoon -- Pharyngeal -- Pharyngeal- Pudding Cup -- Pharyngeal -- Pharyngeal- Honey Teaspoon Pharyngeal residue - valleculae;Pharyngeal residue - pyriform;Penetration/Aspiration before swallow;Penetration/Aspiration during swallow;Reduced laryngeal elevation;Reduced epiglottic inversion;Reduced pharyngeal peristalsis Pharyngeal Material enters airway, remains ABOVE vocal cords then ejected out;Material does not enter airway;Material enters airway, passes BELOW cords then ejected out Pharyngeal- Honey Cup -- Pharyngeal -- Pharyngeal- Nectar Teaspoon -- Pharyngeal -- Pharyngeal- Nectar Cup Reduced tongue base retraction;Reduced airway/laryngeal closure;Reduced epiglottic inversion;Penetration/Aspiration during swallow;Penetration/Aspiration before swallow;Pharyngeal residue - valleculae;Pharyngeal residue - pyriform;Delayed swallow initiation-pyriform sinuses;Reduced pharyngeal peristalsis;Reduced laryngeal elevation Pharyngeal Material enters airway, remains ABOVE vocal cords then ejected out;Material enters airway, CONTACTS cords and not ejected out;Material enters airway, passes BELOW cords then ejected out Pharyngeal- Nectar Straw -- Pharyngeal -- Pharyngeal- Thin Teaspoon -- Pharyngeal -- Pharyngeal- Thin Cup Penetration/Aspiration during swallow;Reduced airway/laryngeal closure;Reduced pharyngeal peristalsis;Reduced epiglottic inversion;Reduced laryngeal elevation;Reduced tongue base retraction;Delayed swallow initiation-pyriform sinuses;Pharyngeal residue -  valleculae;Pharyngeal residue - pyriform;Penetration/Aspiration before swallow Pharyngeal Material enters airway, remains ABOVE vocal cords then ejected out;Material enters airway, CONTACTS cords and not ejected out Pharyngeal- Thin Straw Reduced tongue base retraction;Penetration/Aspiration before swallow;Reduced epiglottic inversion;Delayed swallow initiation-pyriform sinuses;Reduced airway/laryngeal closure;Reduced pharyngeal peristalsis;Pharyngeal residue - valleculae;Pharyngeal residue - pyriform Pharyngeal Material enters airway, passes BELOW cords without attempt by patient to eject out (silent aspiration) Pharyngeal- Puree Delayed swallow initiation-vallecula;Reduced laryngeal elevation;Reduced tongue base retraction;Reduced epiglottic inversion;Reduced pharyngeal peristalsis;Pharyngeal residue - pyriform;Pharyngeal residue - valleculae Pharyngeal -- Pharyngeal- Mechanical Soft Delayed swallow initiation-vallecula;Reduced laryngeal elevation;Reduced pharyngeal peristalsis;Reduced epiglottic inversion;Reduced tongue base retraction;Pharyngeal residue - valleculae;Pharyngeal residue - pyriform Pharyngeal -- Pharyngeal- Regular -- Pharyngeal -- Pharyngeal-  Multi-consistency -- Pharyngeal -- Pharyngeal- Pill Other (Comment) Pharyngeal -- Pharyngeal Comment --  No flowsheet data found. Chelsea E Hartness MA, CCC-SLP 04/29/2020, 11:46 AM              DG Hip Port Unilat With Pelvis 1V Left  Result Date: 05/11/2020 CLINICAL DATA:  Left hip arthroplasty, left hip fracture EXAM: DG HIP (WITH OR WITHOUT PELVIS) 1V PORT LEFT COMPARISON:  05/10/2020 FINDINGS: Frontal view of the pelvis as well as frontal and cross-table lateral views of the left hip are obtained. Left hip hemiarthroplasty is identified in the expected position without signs of acute complication. Postsurgical changes are seen within the overlying soft tissues. Right hip and the remainder of the bony pelvis are unremarkable. IMPRESSION: 1. Unremarkable  left hip hemiarthroplasty. Electronically Signed   By: Sharlet Salina M.D.   On: 05/11/2020 19:29   DG Hip Unilat With Pelvis 2-3 Views Left  Result Date: 05/10/2020 CLINICAL DATA:  Larey Seat out of bed, left leg deformity EXAM: DG HIP (WITH OR WITHOUT PELVIS) 2-3V LEFT COMPARISON:  None. FINDINGS: Frontal view of the pelvis as well as frontal and cross-table lateral views of the left hip are obtained. There is a subcapital left femoral neck fracture with impaction and varus angulation at the fracture site. No dislocation. The remainder of the bony pelvis is unremarkable. IMPRESSION: 1. Subcapital left femoral neck fracture as above. Electronically Signed   By: Sharlet Salina M.D.   On: 05/10/2020 21:54   CT HEAD CODE STROKE WO CONTRAST  Result Date: 04/28/2020 CLINICAL DATA:  Code stroke.  Facial droop. EXAM: CT HEAD WITHOUT CONTRAST TECHNIQUE: Contiguous axial images were obtained from the base of the skull through the vertex without intravenous contrast. COMPARISON:  Head MRI 04/03/2020 FINDINGS: Brain: There is no evidence of an acute infarct, intracranial hemorrhage, mass, midline shift, or extra-axial fluid collection. Hypodensities in the cerebral white matter bilaterally are nonspecific but compatible with mild chronic small vessel ischemic disease. A chronic lacunar infarct is again noted in the right thalamus. Mild ventriculomegaly is unchanged and out of proportion to the size of the sulci and may reflect central predominant cerebral atrophy or normal pressure hydrocephalus. Vascular: Calcified atherosclerosis at the skull base. No hyperdense vessel. Skull: No fracture or suspicious osseous lesion. Sinuses/Orbits: Visualized paranasal sinuses and mastoid air cells are clear. Orbits are unremarkable. Other: None. ASPECTS [facility] Stroke Program Early CT Score) - Ganglionic level infarction (caudate, lentiform nuclei, internal capsule, insula, M1-M3 cortex): 7 - Supraganglionic infarction (M4-M6 cortex):  3 Total score (0-10 with 10 being normal): 10 IMPRESSION: 1. No evidence of acute intracranial abnormality. 2. ASPECTS is 10. 3. Mild chronic small vessel ischemic disease. These results were communicated to Dr. Amada Jupiter at 4:57 pm on 04/28/2020 by text page via the [facility] messaging system. Electronically Signed   By: Sebastian Ache M.D.   On: 04/28/2020 16:58    Microbiology: Recent Results (from the past 240 hour(s))  SARS Coronavirus 2 by RT PCR (hospital order, performed in [facility] hospital lab) Nasopharyngeal Nasopharyngeal Swab     Status: None   Collection Time: 05/11/20  3:30 AM   Specimen: Nasopharyngeal Swab  Result Value Ref Range Status   SARS Coronavirus 2 NEGATIVE NEGATIVE Final    Comment: (NOTE) SARS-CoV-2 target nucleic acids are NOT DETECTED.  The SARS-CoV-2 RNA is generally detectable in upper and lower respiratory specimens during the acute phase of infection. The lowest concentration of SARS-CoV-2 viral copies this assay can detect is  250 copies / mL. A negative result does not preclude SARS-CoV-2 infection and should not be used as the sole basis for treatment or other patient management decisions.  A negative result may occur with improper specimen collection / handling, submission of specimen other than nasopharyngeal swab, presence of viral mutation(s) within the areas targeted by this assay, and inadequate number of viral copies (<250 copies / mL). A negative result must be combined with clinical observations, patient history, and epidemiological information.  Fact Sheet for Patients:   BoilerBrush.com.cy  Fact Sheet for Healthcare Providers: https://pope.com/  This test is not yet approved or  cleared by the [place] FDA and has been authorized for detection and/or diagnosis of SARS-CoV-2 by FDA under an Emergency Use Authorization (EUA).  This EUA will remain in effect (meaning this test can be used)  for the duration of the COVID-19 declaration under Section 564(b)(1) of the Act, 21 U.S.C. section 360bbb-3(b)(1), unless the authorization is terminated or revoked sooner.  Performed at [facility], [address], [place], [place] [address]      Labs: Basic Metabolic Panel: Recent Labs  Lab 05/10/20 2340 05/12/20 0313 05/13/20 0319 05/14/20 0614  NA 139 134* 135 139  K 4.3 4.2 4.4 4.1  CL 102 103 105 107  CO2 26 22 23 26   GLUCOSE 103* 164* 136* 92  BUN 14 16 19 15   CREATININE 0.91 0.83 0.76 0.74  CALCIUM 9.2 8.0* 8.0* 8.1*  MG  --  1.9 1.9 1.9   Liver Function Tests: Recent Labs  Lab 05/12/20 0313  AST 21  ALT 20  ALKPHOS 56  BILITOT 0.6  PROT 5.2*  ALBUMIN 3.0*   No results for input(s): LIPASE, AMYLASE in the last 168 hours. No results for input(s): AMMONIA in the last 168 hours. CBC: Recent Labs  Lab 05/10/20 2340 05/12/20 0313 05/13/20 0319 05/14/20 0614  WBC 9.2 9.1 10.6* 4.9  NEUTROABS 7.7 8.4*  --  2.8  HGB 13.0 9.0* 7.7* 7.8*  HCT 40.0 27.0* 24.0* 24.3*  MCV 93.7 93.8 95.6 97.2  PLT 146* 102* 104* 112*   Cardiac Enzymes: No results for input(s): CKTOTAL, CKMB, CKMBINDEX, TROPONINI in the last 168 hours. BNP: BNP (last 3 results) No results for input(s): BNP in the last 8760 hours.  ProBNP (last 3 results) No results for input(s): PROBNP in the last 8760 hours.  CBG: No results for input(s): GLUCAP in the last 168 hours.     Signed:  07/13/20 MD, PhD, FACP  [facility] 05/14/2020, 10:44 AM

## 2020-05-14 NOTE — Progress Notes (Signed)
Physical Therapy Treatment Patient Details Name: Kristin Rush MRN: [id number] DOB: 05/05/1941 Today's Date: 05/14/2020    History of Present Illness Patient is 79 y.o. female s/p Lt hemiarthroplasty on 05/11/20 secondary to fall out of bed. PMH history significant for recent stroke (hospitalization 8/18-8/25), CHF, osteoporosis, orthostatic hypotension, HOH.     PT Comments    Pt very cooperative and eager to mobilize.  Pt continues orthostatic but with delayed onset of dizziness allowing ambulation short distance in room and up to chair - RN monitoring vitals.  Pt and sister educated on correct technique for log roll with pillow between knees to allow side sleeping and for abdominal binder placement.  Pt continues to require significant physical assist for all mobility tasks and continues orthostatic with mobility - recommend transport home with non-emergent ambulance at this time for pt/family safety.   Follow Up Recommendations  Home health PT     Equipment Recommendations  None recommended by PT    Recommendations for Other Services       Precautions / Restrictions Precautions Precautions: Fall;Posterior Hip Precaution Booklet Issued: Yes (comment) Precaution Comments: L inattention secondary to recent CVA; pt recalls 1/3 THR without cues - all THR reviewed Restrictions Weight Bearing Restrictions: No Other Position/Activity Restrictions: WBAT Lt LE    Mobility  Bed Mobility Overal bed mobility: Needs Assistance Bed Mobility: Supine to Sit Rolling: Mod assist   Supine to sit: Mod assist     General bed mobility comments: Increased time with cues for sequence, THP, and use of R LE to self assist; Physical assist to manage L LE and to bring trunk to upright;  Cues for proper log roll technique with pillow placement for roll to side  Transfers Overall transfer level: Needs assistance Equipment used: Rolling walker (2 wheeled) Transfers: Sit to/from Stand Sit to  Stand: Mod assist;From elevated surface Stand pivot transfers: Mod assist       General transfer comment: cues for LE management, THP, use of UEs to self assist.  Physical assist to bring wt up and fwd and to balance in standing  Ambulation/Gait Ambulation/Gait assistance: Min assist;Mod assist Gait Distance (Feet): 8 Feet Assistive device: Rolling walker (2 wheeled) Gait Pattern/deviations: Step-through pattern;Decreased stride length;Shuffle Gait velocity: decreased   General Gait Details: cues for sequence, posture and position from RW; Distance ltd by onset of dizziness   Stairs             Wheelchair Mobility    Modified Rankin (Stroke Patients Only)       Balance Overall balance assessment: Needs assistance Sitting-balance support: Feet supported;Bilateral upper extremity supported Sitting balance-Leahy Scale: Fair Sitting balance - Comments: significant posterior lean initially requiring min assist, reduced to sup with      Standing balance-Leahy Scale: Poor Standing balance comment: BUE support needed on RW                            Cognition Arousal/Alertness: Awake/alert Behavior During Therapy: Flat affect Overall Cognitive Status: History of cognitive impairments - at baseline Area of Impairment: Following commands;Problem solving;Memory;Safety/judgement                     Memory: Decreased short-term memory;Decreased recall of precautions Following Commands: Follows one step commands with increased time Safety/Judgement: Decreased awareness of safety;Decreased awareness of deficits   Problem Solving: Slow processing;Decreased initiation;Difficulty sequencing;Requires verbal cues General Comments: patient requires increased processing time, increased difficulty  with transitional movements      Exercises Total Joint Exercises Ankle Circles/Pumps: AROM;Both;15 reps;Supine Quad Sets: AROM;Both;5 reps;Supine Heel Slides:  AAROM;Left;20 reps;Supine Hip ABduction/ADduction: AAROM;Left;15 reps;Supine    General Comments        Pertinent Vitals/Pain Pain Assessment: Faces Faces Pain Scale: Hurts little more Pain Location: Lt hip Pain Descriptors / Indicators: Guarding;Grimacing Pain Intervention(s): Limited activity within patient's tolerance;Monitored during session;Premedicated before session;Ice applied    Home Living                      Prior Function            PT Goals (current goals can now be found in the care plan section) Acute Rehab PT Goals Patient Stated Goal: Regain as much independence asap PT Goal Formulation: With patient/family Time For Goal Achievement: 05/26/20 Potential to Achieve Goals: Good Progress towards PT goals: Progressing toward goals    Frequency    Min 4X/week      PT Plan Discharge plan needs to be updated    Co-evaluation              AM-PAC PT "6 Clicks" Mobility   Outcome Measure  Help needed turning from your back to your side while in a flat bed without using bedrails?: A Lot Help needed moving from lying on your back to sitting on the side of a flat bed without using bedrails?: A Lot Help needed moving to and from a bed to a chair (including a wheelchair)?: A Lot Help needed standing up from a chair using your arms (e.g., wheelchair or bedside chair)?: A Lot Help needed to walk in hospital room?: A Lot Help needed climbing 3-5 steps with a railing? : A Lot 6 Click Score: 12    End of Session Equipment Utilized During Treatment: Gait belt Activity Tolerance: Patient tolerated treatment well;Treatment limited secondary to medical complications (Comment) Patient left: in chair;with call bell/phone within reach;with family/visitor present Nurse Communication: Mobility status PT Visit Diagnosis: Repeated falls (R29.6);Muscle weakness (generalized) (M62.81);Difficulty in walking, not elsewhere classified (R26.2);Unsteadiness on feet  (R26.81);Other abnormalities of gait and mobility (R26.89)     Time: 6578-4696 PT Time Calculation (min) (ACUTE ONLY): 35 min  Charges:  $Gait Training: 8-22 mins $Therapeutic Exercise: 8-22 mins                     Mauro Kaufmann PT Acute Rehabilitation Services Pager [phone] Office [phone]    Daniqua Campoy 05/14/2020, 12:41 PM

## 2020-06-10 ENCOUNTER — Telehealth: Payer: Self-pay | Admitting: Internal Medicine

## 2020-06-10 NOTE — Telephone Encounter (Signed)
I spoke with patient who gave me permission to speak with Inocencio Homes. Inocencio Homes reports patient is seen by home health nurse and several therapists. Today when home health nurse saw patient her BP was 166/96. No other readings available although Gayle states BP was elevated yesterday.  Patient has has been lethargic for last 2-3 days.  Is being treated for a UTI. Dizziness had improved for awhile but has now returned.  Not as bad as it has been in the past.  No fainting episodes.  Nurse is coming back to see patient tomorrow.  I asked Inocencio Homes to have nurse call recent BP readings to office tomorrow.

## 2020-06-10 NOTE — Telephone Encounter (Signed)
Pt c/o BP issue: STAT if pt c/o blurred vision, one-sided weakness or slurred speech  1. What are your last 5 BP readings?  166/96 No additional readings available  2. Are you having any other symptoms (ex. Dizziness, headache, blurred vision, passed out)? Dizziness   3. What is your BP issue? BP is elevated.   STAT if patient feels like he/she is going to faint   1) Are you dizzy now? No   2) Do you feel faint or have you passed out? No   3) Do you have any other symptoms? No   4) Have you checked your HR and BP (record if available)? No

## 2020-06-11 NOTE — Telephone Encounter (Signed)
Would then stop the salt tabs, and if BP still elevated after 2-3 days, would then stop florinef as well. Needs close follow up, recommend seeing PCP in next 7-10 days if possible. She has chronic orthostatic hypotension, will have to monitor closely for low blood pressure as well.

## 2020-06-11 NOTE — Telephone Encounter (Signed)
Would stop midodrine.  Recheck BP daily.

## 2020-06-11 NOTE — Telephone Encounter (Signed)
Called daughter Inocencio Homes back-  She states patient is not on Midodrine any longer- she states that she was taken off of those at the hospital, and they have her back on 'salt' pills three times a day.  Advised I would notify Dr.Acharya, and would call her back-  ED visit is in chart.   Thanks!

## 2020-06-11 NOTE — Telephone Encounter (Signed)
   Pt c/o BP issue: STAT if pt c/o blurred vision, one-sided weakness or slurred speech  1. What are your last 5 BP readings? 06/08/20: 182/86, 196/90 06/10/20: 166/96 06/11/20: 180/80  2. Are you having any other symptoms (ex. Dizziness, headache, blurred vision, passed out)? Wheezing   3. What is your BP issue? Kristin Rush, [facility] PTA called. The patient's BP was above the parameters and he needed to report the reading to Dr. Jacques Navy. Please contact the daughter Inocencio Homes to discuss treatment options

## 2020-06-11 NOTE — Telephone Encounter (Signed)
Advised from Home health to send over BP readings-   I will call back with recommendations from Dr.Acharya.   Thank you!

## 2020-06-11 NOTE — Telephone Encounter (Signed)
Called patient, spoke to sister-  Gave instructions, she verbalized understanding. Will call PCP office for close follow up appointment.

## 2020-06-17 ENCOUNTER — Telehealth: Payer: Self-pay | Admitting: Internal Medicine

## 2020-06-17 NOTE — Telephone Encounter (Signed)
Spoke with Azure from [facility] who states patients blood pressure today is 184/86. Home health is unable to provide recent BP readings, and family does not have those as well. Per home health her only previous BP recorded this week is from 10/5 and it was 157/60. Per chart review home health had called in to office last week stating that the patient was hypertensive. Patient has history of Orthostatic Hypotension, and was taking Midodrine, salt tabs and florinef. Per the telephone encounter last week the patient had stopped the midodrine and salt tabs and was going to continue to monitor blood pressure readings. Per Dr. Janit Bern recommendation patient could stop the florinef and follow up closely given the patients history of orthostatic hypotension. Per patients sister patient has been more tired lately, dizzy, and complains of headaches. Currently today per the patients sister the patient has no symptoms. Patients sister stated they reached out to PCP regarding issues and was referred back to cardiology. Spoke with DOD Antoine Poche) who stated to have patient hold florinef, continue monitoring BP and to follow up with patient next week. When returning phone call to schedule appointment and give DOD's advice to patients sister, per family the patient had passed out last 2022/12/26 when doing physical therapy. Per patients family the patient came back "to" and was fine, per family patients blood pressure was fine during this episode as well so they did not contact anyone regarding this. Patients sister asked if these issues could be related to the patients "hydrocephalus". Advised patients sister that given patients symptoms she needs to reach out to patients neurologist and let them know that patient had passed out last week and make them aware of patients complaint of headaches and dizziness as well. Patients sister stated that she would contact them and let them know. Advised patients sister to hold the  florinef and to continue monitoring patients blood pressure and write these readings down. Also advised patients sister that if patient were to have another episode where she felt dizzy or passed out or  felt like she were going to pass out that they needed to seek medical attention and to call 911 or report to ER. Appointment made for patient for Monday 10/11 at 1145am with Bettina Gavia PA. Patients sister verbalized understanding of all instructions.

## 2020-06-17 NOTE — Telephone Encounter (Signed)
Pt c/o BP issue: STAT if pt c/o blurred vision, one-sided weakness or slurred speech  1. What are your last 5 BP readings? 184/86  2. Are you having any other symptoms (ex. Dizziness, headache, blurred vision, passed out)? Had a headache today, but not now  3. What is your BP issue? Azure from [facility] called to inform the patient has elevated BP.

## 2020-06-20 ENCOUNTER — Emergency Department (HOSPITAL_COMMUNITY): Payer: Medicare Other

## 2020-06-20 ENCOUNTER — Inpatient Hospital Stay (HOSPITAL_COMMUNITY)
Admission: EM | Admit: 2020-06-20 | Discharge: 2020-06-25 | DRG: 371 | Disposition: A | Discharge status: Home Health | Payer: Medicare Other | Attending: Family Medicine | Admitting: Family Medicine

## 2020-06-20 DIAGNOSIS — Z7902 Long term (current) use of antithrombotics/antiplatelets: Secondary | ICD-10-CM

## 2020-06-20 DIAGNOSIS — G4710 Hypersomnia, unspecified: Secondary | ICD-10-CM | POA: Diagnosis present

## 2020-06-20 DIAGNOSIS — Z8249 Family history of ischemic heart disease and other diseases of the circulatory system: Secondary | ICD-10-CM

## 2020-06-20 DIAGNOSIS — I5032 Chronic diastolic (congestive) heart failure: Secondary | ICD-10-CM | POA: Diagnosis present

## 2020-06-20 DIAGNOSIS — D696 Thrombocytopenia, unspecified: Secondary | ICD-10-CM | POA: Diagnosis present

## 2020-06-20 DIAGNOSIS — Z8673 Personal history of transient ischemic attack (TIA), and cerebral infarction without residual deficits: Secondary | ICD-10-CM

## 2020-06-20 DIAGNOSIS — Z885 Allergy status to narcotic agent status: Secondary | ICD-10-CM

## 2020-06-20 DIAGNOSIS — R4182 Altered mental status, unspecified: Secondary | ICD-10-CM

## 2020-06-20 DIAGNOSIS — Z6823 Body mass index (BMI) 23.0-23.9, adult: Secondary | ICD-10-CM

## 2020-06-20 DIAGNOSIS — G9341 Metabolic encephalopathy: Secondary | ICD-10-CM | POA: Diagnosis present

## 2020-06-20 DIAGNOSIS — E876 Hypokalemia: Secondary | ICD-10-CM | POA: Diagnosis present

## 2020-06-20 DIAGNOSIS — Z96642 Presence of left artificial hip joint: Secondary | ICD-10-CM | POA: Diagnosis present

## 2020-06-20 DIAGNOSIS — Z79899 Other long term (current) drug therapy: Secondary | ICD-10-CM

## 2020-06-20 DIAGNOSIS — R159 Full incontinence of feces: Secondary | ICD-10-CM | POA: Diagnosis present

## 2020-06-20 DIAGNOSIS — Z882 Allergy status to sulfonamides status: Secondary | ICD-10-CM

## 2020-06-20 DIAGNOSIS — Z66 Do not resuscitate: Secondary | ICD-10-CM | POA: Diagnosis present

## 2020-06-20 DIAGNOSIS — I951 Orthostatic hypotension: Secondary | ICD-10-CM | POA: Diagnosis present

## 2020-06-20 DIAGNOSIS — Z20822 Contact with and (suspected) exposure to covid-19: Secondary | ICD-10-CM | POA: Diagnosis present

## 2020-06-20 DIAGNOSIS — Z96649 Presence of unspecified artificial hip joint: Secondary | ICD-10-CM

## 2020-06-20 DIAGNOSIS — D649 Anemia, unspecified: Secondary | ICD-10-CM | POA: Diagnosis present

## 2020-06-20 DIAGNOSIS — I110 Hypertensive heart disease with heart failure: Secondary | ICD-10-CM | POA: Diagnosis present

## 2020-06-20 DIAGNOSIS — R531 Weakness: Secondary | ICD-10-CM

## 2020-06-20 DIAGNOSIS — F0390 Unspecified dementia without behavioral disturbance: Secondary | ICD-10-CM | POA: Diagnosis present

## 2020-06-20 DIAGNOSIS — Z7982 Long term (current) use of aspirin: Secondary | ICD-10-CM

## 2020-06-20 DIAGNOSIS — A0472 Enterocolitis due to Clostridium difficile, not specified as recurrent: Secondary | ICD-10-CM | POA: Diagnosis not present

## 2020-06-20 DIAGNOSIS — R630 Anorexia: Secondary | ICD-10-CM | POA: Diagnosis present

## 2020-06-20 DIAGNOSIS — H903 Sensorineural hearing loss, bilateral: Secondary | ICD-10-CM | POA: Diagnosis present

## 2020-06-20 DIAGNOSIS — E860 Dehydration: Secondary | ICD-10-CM | POA: Diagnosis present

## 2020-06-20 DIAGNOSIS — E785 Hyperlipidemia, unspecified: Secondary | ICD-10-CM | POA: Diagnosis present

## 2020-06-20 DIAGNOSIS — R2681 Unsteadiness on feet: Secondary | ICD-10-CM | POA: Diagnosis present

## 2020-06-20 LAB — RESPIRATORY PANEL BY RT PCR (FLU A&B, COVID)
Influenza A by PCR: NEGATIVE
Influenza B by PCR: NEGATIVE
SARS Coronavirus 2 by RT PCR: NEGATIVE

## 2020-06-20 LAB — CBC WITH DIFFERENTIAL/PLATELET
Abs Immature Granulocytes: 0.01 10*3/uL (ref 0.00–0.07)
Basophils Absolute: 0 10*3/uL (ref 0.0–0.1)
Basophils Relative: 0 %
Eosinophils Absolute: 0 10*3/uL (ref 0.0–0.5)
Eosinophils Relative: 0 %
HCT: 40.2 % (ref 36.0–46.0)
Hemoglobin: 11.4 g/dL — ABNORMAL LOW (ref 12.0–15.0)
Immature Granulocytes: 0 %
Lymphocytes Relative: 10 %
Lymphs Abs: 0.8 10*3/uL (ref 0.7–4.0)
MCH: 31 pg (ref 26.0–34.0)
MCHC: 28.4 g/dL — ABNORMAL LOW (ref 30.0–36.0)
MCV: 109.2 fL — ABNORMAL HIGH (ref 80.0–100.0)
Monocytes Absolute: 0.7 10*3/uL (ref 0.1–1.0)
Monocytes Relative: 9 %
Neutro Abs: 6.3 10*3/uL (ref 1.7–7.7)
Neutrophils Relative %: 81 %
Platelets: 101 10*3/uL — ABNORMAL LOW (ref 150–400)
RBC: 3.68 MIL/uL — ABNORMAL LOW (ref 3.87–5.11)
RDW: 13.3 % (ref 11.5–15.5)
WBC: 7.8 10*3/uL (ref 4.0–10.5)
nRBC: 0 % (ref 0.0–0.2)

## 2020-06-20 LAB — COMPREHENSIVE METABOLIC PANEL
ALT: 9 U/L (ref 0–44)
AST: 17 U/L (ref 15–41)
Albumin: 3.3 g/dL — ABNORMAL LOW (ref 3.5–5.0)
Alkaline Phosphatase: 87 U/L (ref 38–126)
Anion gap: 10 (ref 5–15)
BUN: 14 mg/dL (ref 8–23)
CO2: 22 mmol/L (ref 22–32)
Calcium: 7.9 mg/dL — ABNORMAL LOW (ref 8.9–10.3)
Chloride: 101 mmol/L (ref 98–111)
Creatinine, Ser: 0.73 mg/dL (ref 0.44–1.00)
GFR, Estimated: >60 mL/min (ref >60)
Glucose, Bld: 98 mg/dL (ref 70–99)
Potassium: 3.4 mmol/L — ABNORMAL LOW (ref 3.5–5.1)
Sodium: 133 mmol/L — ABNORMAL LOW (ref 135–145)
Total Bilirubin: 0.8 mg/dL (ref 0.3–1.2)
Total Protein: 5.9 g/dL — ABNORMAL LOW (ref 6.5–8.1)

## 2020-06-20 LAB — URINALYSIS, ROUTINE W REFLEX MICROSCOPIC
Bacteria, UA: NONE SEEN
Bilirubin Urine: NEGATIVE
Glucose, UA: NEGATIVE mg/dL
Hgb urine dipstick: NEGATIVE
Ketones, ur: 20 mg/dL — AB
Leukocytes,Ua: NEGATIVE
Nitrite: NEGATIVE
Protein, ur: 30 mg/dL — AB
Specific Gravity, Urine: 1.025 (ref 1.005–1.030)
pH: 6 (ref 5.0–8.0)

## 2020-06-20 LAB — PROTIME-INR
INR: 1.1 (ref 0.8–1.2)
Prothrombin Time: 14 seconds (ref 11.4–15.2)

## 2020-06-20 LAB — C DIFFICILE QUICK SCREEN W PCR REFLEX
C Diff antigen: POSITIVE — AB
C Diff interpretation: DETECTED
C Diff toxin: POSITIVE — AB

## 2020-06-20 LAB — APTT: aPTT: 35 seconds (ref 24–36)

## 2020-06-20 LAB — MAGNESIUM: Magnesium: 1.8 mg/dL (ref 1.7–2.4)

## 2020-06-20 LAB — LACTIC ACID, PLASMA: Lactic Acid, Venous: 1 mmol/L (ref 0.5–1.9)

## 2020-06-20 MED ORDER — ONDANSETRON HCL 4 MG/2ML IJ SOLN: 4.0000 mg | Freq: Four times a day (QID) | INTRAMUSCULAR | Status: DC | PRN

## 2020-06-20 MED ORDER — ESCITALOPRAM OXALATE 10 MG PO TABS
10.0000 mg | ORAL_TABLET | Freq: Every day | ORAL | Status: DC
  Administered 2020-06-20 – 2020-06-25 (×6): 10 mg via ORAL
  Filled 2020-06-20 (×6): qty 1

## 2020-06-20 MED ORDER — FERROUS SULFATE 325 (65 FE) MG PO TABS
325.0000 mg | ORAL_TABLET | Freq: Every day | ORAL | Status: DC
  Administered 2020-06-21 – 2020-06-25 (×5): 325 mg via ORAL
  Filled 2020-06-20 (×5): qty 1

## 2020-06-20 MED ORDER — LACTATED RINGERS IV SOLN: INTRAVENOUS | Status: AC

## 2020-06-20 MED ORDER — ONDANSETRON HCL 4 MG PO TABS: 4.0000 mg | ORAL_TABLET | Freq: Four times a day (QID) | ORAL | Status: DC | PRN

## 2020-06-20 MED ORDER — VANCOMYCIN 50 MG/ML ORAL SOLUTION
125.0000 mg | Freq: Four times a day (QID) | ORAL | Status: DC
  Administered 2020-06-20 – 2020-06-25 (×20): 125 mg via ORAL
  Filled 2020-06-20 (×24): qty 2.5

## 2020-06-20 MED ORDER — LACTATED RINGERS IV BOLUS (SEPSIS)
500.0000 mL | Freq: Once | INTRAVENOUS | Status: AC
  Administered 2020-06-20: 500 mL via INTRAVENOUS

## 2020-06-20 MED ORDER — ASPIRIN EC 81 MG PO TBEC
81.0000 mg | DELAYED_RELEASE_TABLET | Freq: Every day | ORAL | Status: DC
  Administered 2020-06-20 – 2020-06-25 (×6): 81 mg via ORAL
  Filled 2020-06-20 (×6): qty 1

## 2020-06-20 MED ORDER — ATORVASTATIN CALCIUM 40 MG PO TABS
80.0000 mg | ORAL_TABLET | Freq: Every day | ORAL | Status: DC
  Administered 2020-06-21 – 2020-06-24 (×4): 80 mg via ORAL
  Filled 2020-06-20 (×4): qty 2

## 2020-06-20 NOTE — ED Triage Notes (Signed)
Per [facility], Pt from home.  Family stated to EMS that pt increased altered mental status, febrile x 2 weeks,UTI X 2 weeks, severe diarrhea. Recently got booster shot.  HX Normal pressure hydrocephalus, stroke  Vitals 158/87 P 95 T 100.4 Resp 16 CBG 94 bolus

## 2020-06-20 NOTE — Progress Notes (Deleted)
Cardiology Office Note:    Date:  06/20/2020   ID:  Kristin Rush, Kristin Rush [birth year], MRN [id number]  PCP:  Eartha Inch, MD  Cardiologist:  Parke Poisson, MD   Referring MD: Eartha Inch, MD   No chief complaint on file. ***  History of Present Illness:    Kristin Rush is a 79 y.o. female with a hx of     Admitted to [facility] with lethargy and Cdiff      Past Medical History:  Diagnosis Date  . 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 CHF (congestive heart failure) (HCC)   . Chronic brain-hydrocephalus syndrome (HCC)   . CVA (cerebral vascular accident) (HCC) 04/2020  . 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 Lumbar spondylosis 02/18/2018  . Migraine   . Migraines 10/09/2011  . Orthostatic hypotension 10/26/2016   has passed out when up  . Osteoporosis 04/16/2014  . Primary osteoarthritis of both hands 07/11/2017    Past Surgical History:  Procedure Laterality Date  . HIP ARTHROPLASTY Left 05/11/2020   Procedure: ARTHROPLASTY BIPOLAR HIP (HEMIARTHROPLASTY);  Surgeon: Teryl Lucy, MD;  Location: [facility] ORS;  Service: Orthopedics;  Laterality: Left;    Current Medications: No outpatient medications have been marked as taking for the 06/21/20 encounter (Appointment) with Marcelino Duster, PA.     Allergies:   Codeine and Sulfa antibiotics   Social History   Socioeconomic History  . Marital status: Divorced    Spouse name: Not on file  . Number of children: Not on file  . Years of education: Not on file  . Highest education level: Not on file  Occupational History  . Not on file  Tobacco Use  . Smoking status: Never Smoker  . Smokeless tobacco: Never Used  Substance and Sexual Activity  . Alcohol use: No  . Drug use: No  . Sexual activity: Not on file  Other Topics Concern  . Not on file  Social History Narrative  . Not on file   Social Determinants of Health   Financial Resource Strain:   . Difficulty of Paying Living  Expenses: Not on file  Food Insecurity:   . Worried About Programme researcher, broadcasting/film/video in the Last Year: Not on file  . Ran Out of Food in the Last Year: Not on file  Transportation Needs:   . Lack of Transportation (Medical): Not on file  . Lack of Transportation (Non-Medical): Not on file  Physical Activity:   . Days of Exercise per Week: Not on file  . Minutes of Exercise per Session: Not on file  Stress:   . Feeling of Stress : Not on file  Social Connections:   . Frequency of Communication with Friends and Family: Not on file  . Frequency of Social Gatherings with Friends and Family: Not on file  . Attends Religious Services: Not on file  . Active Member of Clubs or Organizations: Not on file  . Attends Banker Meetings: Not on file  . Marital Status: Not on file     Family History: The patient's ***family history is negative for Heart disease.  ROS:   Please see the history of present illness.    *** All other systems reviewed and are negative.  EKGs/Labs/Other Studies Reviewed:    The following studies were reviewed today: ***  EKG:  EKG is *** ordered today.  The ekg ordered today demonstrates ***  Recent Labs: 03/17/2020: TSH 2.896 06/20/2020: ALT 9; BUN 14;  Creatinine, Ser 0.73; Hemoglobin 11.4; Magnesium 1.8; Platelets 101; Potassium 3.4; Sodium 133  Recent Lipid Panel    Component Value Date/Time   CHOL 261 (H) 04/29/2020 0817   TRIG 62 04/29/2020 0817   HDL 66 04/29/2020 0817   CHOLHDL 4.0 04/29/2020 0817   VLDL 12 04/29/2020 0817   LDLCALC 183 (H) 04/29/2020 0817    Physical Exam:    VS:  There were no vitals taken for this visit.    Wt Readings from Last 3 Encounters:  06/20/20 130 lb (59 kg)  05/11/20 130 lb 1.1 oz (59 kg)  05/10/20 130 lb (59 kg)     GEN: *** Well nourished, well developed in no acute distress HEENT: Normal NECK: No JVD; No carotid bruits LYMPHATICS: No lymphadenopathy CARDIAC: ***RRR, no murmurs, rubs,  gallops RESPIRATORY:  Clear to auscultation without rales, wheezing or rhonchi  ABDOMEN: Soft, non-tender, non-distended MUSCULOSKELETAL:  No edema; No deformity  SKIN: Warm and dry NEUROLOGIC:  Alert and oriented x 3 PSYCHIATRIC:  Normal affect   ASSESSMENT:    No diagnosis found. PLAN:    In order of problems listed above:  No diagnosis found.   Medication Adjustments/Labs and Tests Ordered: Current medicines are reviewed at length with the patient today.  Concerns regarding medicines are outlined above.  No orders of the defined types were placed in this encounter.  No orders of the defined types were placed in this encounter.   Signed, Marcelino Duster, PA  06/20/2020 9:07 PM    [facility]

## 2020-06-20 NOTE — ED Provider Notes (Signed)
[facility]-EMERGENCY DEPT Provider Note   CSN: [id number] Arrival date & time: 06/20/20  1235     History Chief Complaint  Patient presents with  . Fever  . Altered Mental Status    Kristin Rush is a 79 y.o. female.  HPI   Patient has a history of normal pressure hydrocephalus as well as prior stroke.  Patient was also recently in the hospital at the beginning of September after having surgery for femoral neck fracture following a fall.  Patient was brought in to the ED by EMS today after having issues with severe diarrhea as well as urinary tract infection.  According to the EMS report the patient has been having intermittent fevers for the last 2 weeks.  She has been treated for UTI.  She also recently got her Covid booster.  In the ED the patient is confused and is unable to provide a clear history.  Per family temperature was 99.2.  She had 4-5 loose stools.  Per family main issue was not eating drinking, responding.   She has had progressive weakness where she can barely stand.  That has progressed over several weakness.  At baseline her dementia did affect her ability to hold a conversation.  Often times she says things that do not make sense.  Past Medical History:  Diagnosis Date  . 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 CHF (congestive heart failure) (HCC)   . Chronic brain-hydrocephalus syndrome (HCC)   . CVA (cerebral vascular accident) (HCC) 04/2020  . 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 Lumbar spondylosis 02/18/2018  . Migraine   . Migraines 10/09/2011  . Orthostatic hypotension November 22, 2016   has passed out when up  . Osteoporosis 04/16/2014  . Primary osteoarthritis of both hands 07/11/2017    Patient Active Problem List   Diagnosis Date Noted  . Fracture of femoral neck, left (HCC) 05/11/2020  . Fall at home, initial encounter 05/11/2020  . Chronic diastolic CHF (congestive heart failure) (HCC) 05/11/2020  . 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 Hip fracture (HCC) 05/11/2020  . CVA (cerebral vascular accident) (HCC) 04/29/2020  . Acute CVA (cerebrovascular accident) (HCC) 04/28/2020  . 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 Frequent falls 03/17/2020  . Postural dizziness with presyncope 03/17/2020  . Essential hypertension 03/17/2020  . Right ear pain 03/17/2020  . Excessive cerumen in both ear canals 03/17/2020  . Syncope and collapse 03/17/2020  . 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 Lumbar spondylosis 02/18/2018  . Primary osteoarthritis of both hands 07/11/2017  . Orthostatic hypotension 22-Nov-2016  . Osteoporosis 04/16/2014  . Migraines 10/09/2011    Past Surgical History:  Procedure Laterality Date  . HIP ARTHROPLASTY Left 05/11/2020   Procedure: ARTHROPLASTY BIPOLAR HIP (HEMIARTHROPLASTY);  Surgeon: Teryl Lucy, MD;  Location: [facility];  Service: Orthopedics;  Laterality: Left;     OB History   No obstetric history on file.     Family History  Problem Relation Age of Onset  . Heart disease Neg Hx     Social History   Tobacco Use  . Smoking status: Never Smoker  . Smokeless tobacco: Never Used  Substance Use Topics  . Alcohol use: No  . Drug use: No    Home Medications Prior to Admission medications   Medication Sig Start Date End Date Taking? Authorizing Provider  acetaminophen (TYLENOL) 500 MG tablet Take 500 mg by mouth daily as needed for mild pain.  Yes [provider]  ALPRAZolam (XANAX) 0.25 MG tablet Take 1 tablet (0.25 mg total) by mouth 2 (two) times daily as needed for anxiety. 05/05/20  Yes Joseph Art, DO  aspirin EC 81 MG EC tablet Take 1 tablet (81 mg total) by mouth daily. Swallow whole. 05/06/20  Yes Joseph Art, DO  atorvastatin (LIPITOR) 80 MG tablet Take 1 tablet (80 mg total) by mouth daily at 6 PM. 05/05/20  Yes Vann, Jessica U, DO  cefdinir (OMNICEF) 300 MG capsule  Take 300 mg by mouth in the morning and at bedtime. 7 day supply 06/14/20  Yes [provider]  escitalopram (LEXAPRO) 10 MG tablet Take 10 mg by mouth daily.   Yes [provider]  ferrous sulfate 325 (65 FE) MG tablet Take 1 tablet (325 mg total) by mouth daily with breakfast. 05/14/20  Yes Albertine Grates, MD  senna-docusate (SENOKOT-S) 8.6-50 MG tablet Take 1 tablet by mouth at bedtime. Patient taking differently: Take 1 tablet by mouth daily.  05/14/20  Yes Albertine Grates, MD  clopidogrel (PLAVIX) 75 MG tablet Take 1 tablet (75 mg total) by mouth daily. Patient not taking: Reported on 06/20/2020 05/06/20   Joseph Art, DO  enoxaparin (LOVENOX) 40 MG/0.4ML injection Inject 0.4 mLs (40 mg total) into the skin daily. This should be started the day after her last Plavix dose. She will need lovenox daily for 30 days after her surgery and then it can be discontinued Patient not taking: Reported on 06/20/2020 05/14/20   Armida Sans, PA-C  feeding supplement, ENSURE ENLIVE, (ENSURE ENLIVE) LIQD Take 237 mLs by mouth 2 (two) times daily between meals. Patient not taking: Reported on 06/20/2020 05/14/20   Albertine Grates, MD  Maltodextrin-Xanthan Gum [facility] CLEAR) POWD To make liquids honey thick 05/05/20   Marlin Canary U, DO  nystatin (MYCOSTATIN) 100000 UNIT/ML suspension Take 5 mLs (500,000 Units total) by mouth 4 (four) times daily. Patient not taking: Reported on 06/20/2020 05/14/20   Albertine Grates, MD    Allergies    Codeine and Sulfa antibiotics  Review of Systems   Review of Systems  Unable to perform ROS: Mental status change    Physical Exam Updated Vital Signs BP (!) 103/93   Pulse 90   Temp 98.5 F (36.9 C) (Oral)   Resp 20   Ht 1.6 m (5\' 3" )   Wt 59 kg   SpO2 96%   BMI 23.03 kg/m   Physical Exam Vitals and nursing note reviewed.  Constitutional:      Appearance: She is well-developed. She is ill-appearing.  HENT:     Head: Normocephalic and atraumatic.     Right  Ear: External ear normal.     Left Ear: External ear normal.  Eyes:     General: No scleral icterus.       Right eye: No discharge.        Left eye: No discharge.     Conjunctiva/sclera: Conjunctivae normal.  Neck:     Trachea: No tracheal deviation.  Cardiovascular:     Rate and Rhythm: Normal rate and regular rhythm.  Pulmonary:     Effort: Pulmonary effort is normal. No respiratory distress.     Breath sounds: Normal breath sounds. No stridor. No wheezing or rales.  Abdominal:     General: Bowel sounds are normal. There is no distension.     Palpations: Abdomen is soft.     Tenderness: There is no abdominal tenderness. There  is no guarding or rebound.  Musculoskeletal:        General: No tenderness.     Cervical back: Neck supple. No rigidity.  Lymphadenopathy:     Cervical: No cervical adenopathy.  Skin:    General: Skin is warm and dry.     Findings: No rash.  Neurological:     Mental Status: She is lethargic and disoriented.     Cranial Nerves: No cranial nerve deficit (no facial droop, ).     Sensory: No sensory deficit.     Motor: Weakness present. No abnormal muscle tone or seizure activity.     Comments: Pt unable to answer questions clearly, will respond to me when I speak to her, unable to tell me date or where she is located     ED Results / Procedures / Treatments   Labs (all labs ordered are listed, but only abnormal results are displayed) Labs Reviewed  C DIFFICILE QUICK SCREEN W PCR REFLEX - Abnormal; Notable for the following components:      Result Value   C Diff antigen POSITIVE (*)    C Diff toxin POSITIVE (*)    All other components within normal limits  COMPREHENSIVE METABOLIC PANEL - Abnormal; Notable for the following components:   Sodium 133 (*)    Potassium 3.4 (*)    Calcium 7.9 (*)    Total Protein 5.9 (*)    Albumin 3.3 (*)    All other components within normal limits  CBC WITH DIFFERENTIAL/PLATELET - Abnormal; Notable for the  following components:   RBC 3.68 (*)    Hemoglobin 11.4 (*)    MCV 109.2 (*)    MCHC 28.4 (*)    Platelets 101 (*)    All other components within normal limits  URINALYSIS, ROUTINE W REFLEX MICROSCOPIC - Abnormal; Notable for the following components:   Ketones, ur 20 (*)    Protein, ur 30 (*)    All other components within normal limits  RESPIRATORY PANEL BY RT PCR (FLU A&B, COVID)  URINE CULTURE  CULTURE, BLOOD (ROUTINE X 2)  CULTURE, BLOOD (ROUTINE X 2)  LACTIC ACID, PLASMA  PROTIME-INR  APTT    EKG EKG Interpretation  Date/Time:  Sunday June 20 2020 13:23:08 EDT Ventricular Rate:  92 PR Interval:    QRS Duration: 81 QT Interval:  354 QTC Calculation: 438 R Axis:   85 Text Interpretation: Sinus rhythm Borderline right axis deviation Minimal ST depression, diffuse leads No significant change since last tracing Confirmed by Linwood DibblesKnapp, Sherriann Szuch [phone]([id number]) on 06/20/2020 2:53:51 PM   Radiology CT Head Wo Contrast  Result Date: 06/20/2020 CLINICAL DATA:  Mental status change. EXAM: CT HEAD WITHOUT CONTRAST TECHNIQUE: Contiguous axial images were obtained from the base of the skull through the vertex without intravenous contrast. COMPARISON:  May 10, 2020 FINDINGS: Brain: No evidence of acute infarction, hemorrhage, hydrocephalus, extra-axial collection or mass lesion/mass effect. There is chronic diffuse atrophy. Chronic bilateral periventricular white matter small vessel ischemic changes are noted. Old infarcts are identified in the right basal ganglia and right thalamus. Vascular: No hyperdense vessel is noted. Skull: Normal. Negative for fracture or focal lesion. Sinuses/Orbits: No acute finding. Other: None IMPRESSION: 1. No focal acute intracranial abnormality identified. 2. Chronic diffuse atrophy. Chronic bilateral periventricular white matter small vessel ischemic change. Old infarcts in the right basal ganglia and right thalamus. Electronically Signed   By: Sherian ReinWei-Chen  Lin M.D.    On: 06/20/2020 16:07   DG Chest Port 1  View  Result Date: 06/20/2020 CLINICAL DATA:  Sepsis EXAM: PORTABLE CHEST 1 VIEW COMPARISON:  May 10, 2020 FINDINGS: The cardiomediastinal silhouette is unchanged in contour.Atherosclerotic calcifications of the aorta. No pleural effusion. No pneumothorax. No acute pleuroparenchymal abnormality. Visualized abdomen is unremarkable. Multilevel degenerative changes of the thoracic spine. IMPRESSION: No acute cardiopulmonary abnormality. Electronically Signed   By: Meda Klinefelter MD   On: 06/20/2020 14:14    Procedures Procedures (including critical care time)  Medications Ordered in ED Medications  lactated ringers infusion ( Intravenous New Bag/Given 06/20/20 1441)  vancomycin (VANCOCIN) 50 mg/mL oral solution 125 mg (has no administration in time range)  lactated ringers bolus 500 mL (0 mLs Intravenous Stopped 06/20/20 1530)    ED Course  I have reviewed the triage vital signs and the nursing notes.  Pertinent labs & imaging results that were available during my care of the patient were reviewed by me and considered in my medical decision making (see chart for details).  Clinical Course as of Jun 20 1621  Wynelle Link Jun 20, 2020  1431 Chest x-ray without pneumonia.  Urinalysis without findings of infection   [JK]  1600 Labs show a normal CBC.  Electrolyte panel with mild hyponatremia and hypokalemia.   [JK]  1601 C. difficile test is positive   [JK]  1620 No acute findings on head ct   [JK]    Clinical Course User Index [JK] Linwood Dibbles, MD   MDM Rules/Calculators/A&P                          Pt has history of dementia and normal pressure hydrocephalus.  Pt with new diarrhea, low grade temps, progressive weakness and confusion.  Symptoms concerning for the possibility of stroke, sepsis, infection, dehydration.  ED work-up does not suggest sepsis.  She has low-grade temperature but lactic acid levels normal.  She is normotensive and does not  have a leukocytosis.  Electrolyte panel does not show severe dehydration.  Urinalysis does not suggest UTI.  Head CT without acute findings.  C. difficile test is positive.  Patient symptoms may be related to dehydration and her C. difficile infection.  We will start oral vancomycin.  Weakness may be multifactorial including her comorbid conditions.  No focal signs to suggest stroke at this time but exam is limited.  Will consult with medical service for admission, further workup Final Clinical Impression(s) / ED Diagnoses Final diagnoses:  Clostridium difficile diarrhea  Weakness  Altered mental status, unspecified altered mental status type      Linwood Dibbles, MD 06/20/20 1626

## 2020-06-20 NOTE — ED Notes (Signed)
Attempted to contact family. No answer.

## 2020-06-20 NOTE — H&P (Addendum)
History and Physical    Kristin Rush KKX:[id number] DOB: [birth year] DOA: 06/20/2020  PCP: Eartha Inch, MD  Patient coming from: Home  Chief Complaint: AMS  HPI: Kristin Rush is a 79 y.o. female with medical history significant of dementia, NPH. Hx is through sister. She reports that over the last couple days the patient has been lethargic and sleeping all day. She has had poor appetite and difficult to arouse to eat. When they checked on her this morning, she was unresponsive and  incontinent of bowel and bladder. The family became concerned and brought her to the ED.   Her sister reports that she has generally been out of sorts of the last couple of months since her hip surgery and stroke. But, her lethargy is a new symptom for her. They report no aggravating or alleviating factors. They tried not additional treatment.    ED Course: CTH was negative. Lab work was largely ok. She was found to be c diff positive. She was started on vanc. TRH was called for admission.   Review of Systems:  Remainder of 10 point review of systems is otherwise negative for all not mentioned in HPI.   PMHx Past Medical History:  Diagnosis Date  . 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 CHF (congestive heart failure) (HCC)   . Chronic brain-hydrocephalus syndrome (HCC)   . CVA (cerebral vascular accident) (HCC) 04/2020  . 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 Lumbar spondylosis 02/18/2018  . Migraine   . Migraines 10/09/2011  . Orthostatic hypotension 31-Oct-2016   has passed out when up  . Osteoporosis 04/16/2014  . Primary osteoarthritis of both hands 07/11/2017    PSHx Past Surgical History:  Procedure Laterality Date  . HIP ARTHROPLASTY Left 05/11/2020   Procedure: ARTHROPLASTY BIPOLAR HIP (HEMIARTHROPLASTY);  Surgeon: Teryl Lucy, MD;  Location: [facility];  Service: Orthopedics;  Laterality: Left;    SocHx  reports that she has never smoked. She has never used smokeless  tobacco. She reports that she does not drink alcohol and does not use drugs.  Allergies  Allergen Reactions  . Codeine Nausea And Vomiting  . Sulfa Antibiotics Other (See Comments)    unk    FamHx Family History  Problem Relation Age of Onset  . Heart disease Neg Hx     Prior to Admission medications   Medication Sig Start Date End Date Taking? Authorizing Provider  acetaminophen (TYLENOL) 500 MG tablet Take 500 mg by mouth daily as needed for mild pain.    Yes [provider]  ALPRAZolam (XANAX) 0.25 MG tablet Take 1 tablet (0.25 mg total) by mouth 2 (two) times daily as needed for anxiety. 05/05/20  Yes Joseph Art, DO  aspirin EC 81 MG EC tablet Take 1 tablet (81 mg total) by mouth daily. Swallow whole. 05/06/20  Yes Joseph Art, DO  atorvastatin (LIPITOR) 80 MG tablet Take 1 tablet (80 mg total) by mouth daily at 6 PM. 05/05/20  Yes Vann, Jessica U, DO  cefdinir (OMNICEF) 300 MG capsule Take 300 mg by mouth in the morning and at bedtime. 7 day supply 06/14/20  Yes [provider]  escitalopram (LEXAPRO) 10 MG tablet Take 10 mg by mouth daily.   Yes [provider]  ferrous sulfate 325 (65 FE) MG tablet Take 1 tablet (325 mg total) by mouth daily with breakfast. 05/14/20  Yes Albertine Grates, MD  senna-docusate (SENOKOT-S) 8.6-50 MG tablet Take 1 tablet by mouth at bedtime.  Patient taking differently: Take 1 tablet by mouth daily.  05/14/20  Yes Albertine Grates, MD  clopidogrel (PLAVIX) 75 MG tablet Take 1 tablet (75 mg total) by mouth daily. Patient not taking: Reported on 06/20/2020 05/06/20   Joseph Art, DO  enoxaparin (LOVENOX) 40 MG/0.4ML injection Inject 0.4 mLs (40 mg total) into the skin daily. This should be started the day after her last Plavix dose. She will need lovenox daily for 30 days after her surgery and then it can be discontinued Patient not taking: Reported on 06/20/2020 05/14/20   Armida Sans, PA-C  feeding supplement, ENSURE ENLIVE, (ENSURE  ENLIVE) LIQD Take 237 mLs by mouth 2 (two) times daily between meals. Patient not taking: Reported on 06/20/2020 05/14/20   Albertine Grates, MD  Maltodextrin-Xanthan Gum [facility] CLEAR) POWD To make liquids honey thick 05/05/20   Marlin Canary U, DO  nystatin (MYCOSTATIN) 100000 UNIT/ML suspension Take 5 mLs (500,000 Units total) by mouth 4 (four) times daily. Patient not taking: Reported on 06/20/2020 05/14/20   Albertine Grates, MD    Physical Exam: Vitals:   06/20/20 1257 06/20/20 1313 06/20/20 1500  BP: (!) 158/87 (!) 181/84 (!) 103/93  Pulse: 95 81 90  Resp: 16 20 20   Temp: (!) 100.4 F (38 C) 98.5 F (36.9 C)   TempSrc: Oral Oral   SpO2:  97% 96%  Weight:  59 kg   Height:  5\' 3"  (1.6 m)     General: 79 y.o. female resting in bed in NAD Eyes: PERRL, normal sclera ENMT: Nares patent w/o discharge, orophaynx clear, dentition normal, ears w/o discharge/lesions/ulcers Neck: Supple, trachea midline Cardiovascular: RRR, +S1, S2, no m/g/r, equal pulses throughout Respiratory: CTABL, no w/r/r, normal WOB GI: BS+, NDNT, no masses noted, no organomegaly noted MSK: No e/c/c Skin: No rashes, bruises, ulcerations noted Neuro: A&O x 1 (name only), no focal deficits but follows limited commands Psyc: calm/cooperative, drowsy  Labs on Admission: I have personally reviewed following labs and imaging studies  CBC: Recent Labs  Lab 06/20/20 1317  WBC 7.8  NEUTROABS 6.3  HGB 11.4*  HCT 40.2  MCV 109.2*  PLT 101*   Basic Metabolic Panel: Recent Labs  Lab 06/20/20 1317  NA 133*  K 3.4*  CL 101  CO2 22  GLUCOSE 98  BUN 14  CREATININE 0.73  CALCIUM 7.9*   GFR: Estimated Creatinine Clearance: 48.7 mL/min (by C-G formula based on SCr of 0.73 mg/dL). Liver Function Tests: Recent Labs  Lab 06/20/20 1317  AST 17  ALT 9  ALKPHOS 87  BILITOT 0.8  PROT 5.9*  ALBUMIN 3.3*   No results for input(s): LIPASE, AMYLASE in the last 168 hours. No results for input(s): AMMONIA in the  last 168 hours. Coagulation Profile: Recent Labs  Lab 06/20/20 1501  INR 1.1   Cardiac Enzymes: No results for input(s): CKTOTAL, CKMB, CKMBINDEX, TROPONINI in the last 168 hours. BNP (last 3 results) No results for input(s): PROBNP in the last 8760 hours. HbA1C: No results for input(s): HGBA1C in the last 72 hours. CBG: No results for input(s): GLUCAP in the last 168 hours. Lipid Profile: No results for input(s): CHOL, HDL, LDLCALC, TRIG, CHOLHDL, LDLDIRECT in the last 72 hours. Thyroid Function Tests: No results for input(s): TSH, T4TOTAL, FREET4, T3FREE, THYROIDAB in the last 72 hours. Anemia Panel: No results for input(s): VITAMINB12, FOLATE, FERRITIN, TIBC, IRON, RETICCTPCT in the last 72 hours. Urine analysis:    Component Value Date/Time   COLORURINE YELLOW  06/20/2020 1317   APPEARANCEUR CLEAR 06/20/2020 1317   LABSPEC 1.025 06/20/2020 1317   PHURINE 6.0 06/20/2020 1317   GLUCOSEU NEGATIVE 06/20/2020 1317   HGBUR NEGATIVE 06/20/2020 1317   BILIRUBINUR NEGATIVE 06/20/2020 1317   KETONESUR 20 (A) 06/20/2020 1317   PROTEINUR 30 (A) 06/20/2020 1317   UROBILINOGEN 0.2 10/03/2011 1002   NITRITE NEGATIVE 06/20/2020 1317   LEUKOCYTESUR NEGATIVE 06/20/2020 1317    Radiological Exams on Admission: CT Head Wo Contrast  Result Date: 06/20/2020 CLINICAL DATA:  Mental status change. EXAM: CT HEAD WITHOUT CONTRAST TECHNIQUE: Contiguous axial images were obtained from the base of the skull through the vertex without intravenous contrast. COMPARISON:  May 10, 2020 FINDINGS: Brain: No evidence of acute infarction, hemorrhage, hydrocephalus, extra-axial collection or mass lesion/mass effect. There is chronic diffuse atrophy. Chronic bilateral periventricular white matter small vessel ischemic changes are noted. Old infarcts are identified in the right basal ganglia and right thalamus. Vascular: No hyperdense vessel is noted. Skull: Normal. Negative for fracture or focal lesion.  Sinuses/Orbits: No acute finding. Other: None IMPRESSION: 1. No focal acute intracranial abnormality identified. 2. Chronic diffuse atrophy. Chronic bilateral periventricular white matter small vessel ischemic change. Old infarcts in the right basal ganglia and right thalamus. Electronically Signed   By: Sherian Rein M.D.   On: 06/20/2020 16:07   DG Chest Port 1 View  Result Date: 06/20/2020 CLINICAL DATA:  Sepsis EXAM: PORTABLE CHEST 1 VIEW COMPARISON:  May 10, 2020 FINDINGS: The cardiomediastinal silhouette is unchanged in contour.Atherosclerotic calcifications of the aorta. No pleural effusion. No pneumothorax. No acute pleuroparenchymal abnormality. Visualized abdomen is unremarkable. Multilevel degenerative changes of the thoracic spine. IMPRESSION: No acute cardiopulmonary abnormality. Electronically Signed   By: Meda Klinefelter MD   On: 06/20/2020 14:14    EKG: Independently reviewed. NSR  Assessment/Plan Altered Mental Status     - Hx of dementia with chronic at best A&O x 1     - mentation change is that of increased lethargy over the last several days     - she has a history of NPH and is following with neurosurgery out of [facility]; family reports that she was supposed to have an LP performed in the near future     - family reports that she has had increasing difficulty walking and that she has become increasingly incontinent of bowel and bladder     - CTH today shows no hydrocephalus per radiology report     - spoke with neurosurgery about her NPH history and current presentation. They reviewed scans. No acute intervention at this time.   C diff infection/Diarrhea     - vanc ordered, continue     - contact isolation  Hypokalemia     - mild, replace K+     - check Mg2+  Thrombocytopenia     - she has had thrombocytopenia going back to August     - avoid heparin for now     - no bleed noted  Dementia     - chronic; on lexapro; family notes she is pretty much at baseline  except for lethargy  HLD     - lipitor  Chronic orthostatic hypotension Labile blood pressures     - follows with cardiology and has been on/off midodrine and BP meds.     - BP is all over the place     - Place compression stockings and can escalate to abdominal binder if necessary  Hx of CVA     -  continue home ASA, lipitor     - on honey thick liquid diet at home  DVT prophylaxis: SCD, TED  Code Status: DNR, confirmed with Ronnald RampPam Tucker who confirmed with her sister.  Family Communication: With sister by phone.   Consults called: Neurosurgery  Admission status: Observation  Status is: Observation  The patient remains OBS appropriate and will d/c before 2 midnights.  Dispo: The patient is from: Home              Anticipated d/c is to: Home              Anticipated d/c date is: 1 day              Patient currently is not medically stable to d/c.  Teddy Spikeyrone A Erle Guster DO [facility]  If 7PM-7AM, please contact night-coverage [email or web address]  06/20/2020, 4:50 PM

## 2020-06-21 ENCOUNTER — Ambulatory Visit: Payer: Medicare Other | Admitting: Physician Assistant

## 2020-06-21 ENCOUNTER — Other Ambulatory Visit: Payer: Self-pay

## 2020-06-21 ENCOUNTER — Encounter (HOSPITAL_COMMUNITY): Payer: Self-pay | Admitting: Internal Medicine

## 2020-06-21 DIAGNOSIS — E785 Hyperlipidemia, unspecified: Secondary | ICD-10-CM | POA: Diagnosis present

## 2020-06-21 DIAGNOSIS — Z66 Do not resuscitate: Secondary | ICD-10-CM | POA: Diagnosis present

## 2020-06-21 DIAGNOSIS — R159 Full incontinence of feces: Secondary | ICD-10-CM | POA: Diagnosis present

## 2020-06-21 DIAGNOSIS — Z8249 Family history of ischemic heart disease and other diseases of the circulatory system: Secondary | ICD-10-CM | POA: Diagnosis not present

## 2020-06-21 DIAGNOSIS — A0472 Enterocolitis due to Clostridium difficile, not specified as recurrent: Secondary | ICD-10-CM | POA: Diagnosis present

## 2020-06-21 DIAGNOSIS — Z885 Allergy status to narcotic agent status: Secondary | ICD-10-CM | POA: Diagnosis not present

## 2020-06-21 DIAGNOSIS — Z6823 Body mass index (BMI) 23.0-23.9, adult: Secondary | ICD-10-CM | POA: Diagnosis not present

## 2020-06-21 DIAGNOSIS — E876 Hypokalemia: Secondary | ICD-10-CM | POA: Diagnosis present

## 2020-06-21 DIAGNOSIS — R2681 Unsteadiness on feet: Secondary | ICD-10-CM | POA: Diagnosis present

## 2020-06-21 DIAGNOSIS — E860 Dehydration: Secondary | ICD-10-CM | POA: Diagnosis present

## 2020-06-21 DIAGNOSIS — I5032 Chronic diastolic (congestive) heart failure: Secondary | ICD-10-CM | POA: Diagnosis present

## 2020-06-21 DIAGNOSIS — Z96642 Presence of left artificial hip joint: Secondary | ICD-10-CM | POA: Diagnosis present

## 2020-06-21 DIAGNOSIS — I110 Hypertensive heart disease with heart failure: Secondary | ICD-10-CM | POA: Diagnosis present

## 2020-06-21 DIAGNOSIS — I951 Orthostatic hypotension: Secondary | ICD-10-CM | POA: Diagnosis present

## 2020-06-21 DIAGNOSIS — Z8673 Personal history of transient ischemic attack (TIA), and cerebral infarction without residual deficits: Secondary | ICD-10-CM | POA: Diagnosis not present

## 2020-06-21 DIAGNOSIS — R4182 Altered mental status, unspecified: Secondary | ICD-10-CM | POA: Diagnosis not present

## 2020-06-21 DIAGNOSIS — D649 Anemia, unspecified: Secondary | ICD-10-CM | POA: Diagnosis present

## 2020-06-21 DIAGNOSIS — G9341 Metabolic encephalopathy: Secondary | ICD-10-CM | POA: Diagnosis present

## 2020-06-21 DIAGNOSIS — Z20822 Contact with and (suspected) exposure to covid-19: Secondary | ICD-10-CM | POA: Diagnosis present

## 2020-06-21 DIAGNOSIS — F0390 Unspecified dementia without behavioral disturbance: Secondary | ICD-10-CM | POA: Diagnosis present

## 2020-06-21 DIAGNOSIS — G4710 Hypersomnia, unspecified: Secondary | ICD-10-CM | POA: Diagnosis present

## 2020-06-21 DIAGNOSIS — Z882 Allergy status to sulfonamides status: Secondary | ICD-10-CM | POA: Diagnosis not present

## 2020-06-21 DIAGNOSIS — D696 Thrombocytopenia, unspecified: Secondary | ICD-10-CM | POA: Diagnosis present

## 2020-06-21 DIAGNOSIS — H903 Sensorineural hearing loss, bilateral: Secondary | ICD-10-CM | POA: Diagnosis present

## 2020-06-21 DIAGNOSIS — R531 Weakness: Secondary | ICD-10-CM | POA: Diagnosis present

## 2020-06-21 DIAGNOSIS — R630 Anorexia: Secondary | ICD-10-CM | POA: Diagnosis present

## 2020-06-21 LAB — CBC
HCT: 33.1 % — ABNORMAL LOW (ref 36.0–46.0)
Hemoglobin: 10.8 g/dL — ABNORMAL LOW (ref 12.0–15.0)
MCH: 31.3 pg (ref 26.0–34.0)
MCHC: 32.6 g/dL (ref 30.0–36.0)
MCV: 95.9 fL (ref 80.0–100.0)
Platelets: 108 10*3/uL — ABNORMAL LOW (ref 150–400)
RBC: 3.45 MIL/uL — ABNORMAL LOW (ref 3.87–5.11)
RDW: 13.5 % (ref 11.5–15.5)
WBC: 6.5 10*3/uL (ref 4.0–10.5)
nRBC: 0 % (ref 0.0–0.2)

## 2020-06-21 LAB — URINALYSIS, ROUTINE W REFLEX MICROSCOPIC
Bilirubin Urine: NEGATIVE
Glucose, UA: NEGATIVE mg/dL
Ketones, ur: 5 mg/dL — AB
Nitrite: NEGATIVE
Protein, ur: 30 mg/dL — AB
Specific Gravity, Urine: 1.015 (ref 1.005–1.030)
WBC, UA: >50 WBC/hpf — ABNORMAL HIGH (ref 0–5)
pH: 5 (ref 5.0–8.0)

## 2020-06-21 LAB — COMPREHENSIVE METABOLIC PANEL
ALT: 10 U/L (ref 0–44)
AST: 17 U/L (ref 15–41)
Albumin: 3 g/dL — ABNORMAL LOW (ref 3.5–5.0)
Alkaline Phosphatase: 78 U/L (ref 38–126)
Anion gap: 9 (ref 5–15)
BUN: 12 mg/dL (ref 8–23)
CO2: 24 mmol/L (ref 22–32)
Calcium: 8.2 mg/dL — ABNORMAL LOW (ref 8.9–10.3)
Chloride: 101 mmol/L (ref 98–111)
Creatinine, Ser: 0.67 mg/dL (ref 0.44–1.00)
GFR, Estimated: >60 mL/min (ref >60)
Glucose, Bld: 92 mg/dL (ref 70–99)
Potassium: 3.4 mmol/L — ABNORMAL LOW (ref 3.5–5.1)
Sodium: 134 mmol/L — ABNORMAL LOW (ref 135–145)
Total Bilirubin: 0.9 mg/dL (ref 0.3–1.2)
Total Protein: 5.6 g/dL — ABNORMAL LOW (ref 6.5–8.1)

## 2020-06-21 LAB — URINE CULTURE: Culture: NO GROWTH

## 2020-06-21 MED ORDER — LACTATED RINGERS IV SOLN: INTRAVENOUS | Status: DC

## 2020-06-21 MED ORDER — STARCH (THICKENING) PO POWD
ORAL | Status: DC | PRN
  Filled 2020-06-21: qty 227

## 2020-06-21 MED ORDER — POTASSIUM CHLORIDE 20 MEQ/15ML (10%) PO SOLN
40.0000 meq | Freq: Every day | ORAL | Status: DC
  Administered 2020-06-21 – 2020-06-25 (×5): 40 meq via ORAL
  Filled 2020-06-21 (×5): qty 30

## 2020-06-21 MED ORDER — ACETAMINOPHEN 325 MG PO TABS
650.0000 mg | ORAL_TABLET | Freq: Four times a day (QID) | ORAL | Status: DC | PRN
  Administered 2020-06-21 – 2020-06-23 (×3): 650 mg via ORAL
  Filled 2020-06-21 (×3): qty 2

## 2020-06-21 NOTE — Progress Notes (Signed)
Patient verbal order by Dr. Dayna Barker to d/c cardiac monitor order.

## 2020-06-21 NOTE — Progress Notes (Addendum)
PROGRESS NOTE  Kristin Rush  WUJ:[id number] DOB: 08-27-1941 DOA: 06/20/2020 PCP: Eartha Inch, MD   Chief Complaint  Patient presents with  . Fever  . Altered Mental Status   Brief Narrative: As per admitting: 79 y.o. female with medical history significant of dementia, NPH. Hx is through sister. She reports that over the last couple days the patient has been lethargic and sleeping all day. She has had poor appetite and difficult to arouse to eat. When they checked on her this morning, she was unresponsive and  incontinent of bowel and bladder. The family became concerned and brought her to the ED.  Her sister reports that she has generally been out of sorts of the last couple of months since her hip surgery and stroke. But, her lethargy is a new symptom for her. They report no aggravating or alleviating factors. They tried not additional treatment.    ED Course: CTH was negative. Lab work was largely ok. She was found to be c diff positive. She was started on vanc. TRH was called for admission.  Subjective:  Seen this morning.  Daughter at the bedside. 100.4 overnight. Patient more alert awake, oriented to self. Urine appears very dark has not been eating and drinking well for few days  Assessment & Plan:  Acute encephalopathy likely metabolic, with history of dementia, AAO x1 at baseline, history of NPH followed with neurosurgery at [facility].  She has dehydration with diarrhea from C. difficile likely contributing.  Seems to be improving we will keep on IV fluid hydration, C. difficile treatment.Neurosurgeon was consulted, who reviewed CT scan admission and no acute intervention at this time.  Clostridium difficile diarrhea: Continue oral vancomycin, contact isolation, IV fluid hydration supportive care.  She was on antibiotics for her recent UTI which seems to have contributed.  Dehydration-appears dehydrated clinically, urine is very dark.  Continue gentle IV hydration  encourage oral intake  Hypokalemia due to diarrhea.K at 3.4.Replete.  Dementia AAO x1 at baseline, seems to be more or less close to baseline.  Continue her Lexapro.    History of CVA/Hyperlipidemia, on Lipitor, aspirin.  Unstable gait and balance/NPH history:followed by [facility] and supposed to have a spinal tap done on outpatient basis.  Advised to follow-up with her neurosurgery soon after discharge.  Chronic orthostatic hypotension,labile blood pressure followed by cardiology,has been on/off midodrine and BP meds.  Monitor blood pressure continue compression stockings.Has been off salt tab  Thrombocytopenia avoiding heparin, plt 108k, monitor.  Nutrition: Diet Order            DIET SOFT Room service appropriate? Yes; Fluid consistency: Thin  Diet effective now                 Body mass index is 23.03 kg/m.  DVT prophylaxis: Place TED hose Start: 06/20/20 1919 Code Status:   Code Status: DNR  Family Communication: plan of care discussed with patient's daughter at the bedside.  Status is: admitted as Observation Meds hospitalized for ongoing management of her C. difficile diarrhea, dehydration, remains on IV rehydration. Dispo: The patient is from: Home              Anticipated d/c is to: Home              Anticipated d/c date is: 1 day              Patient currently is not medically stable to d/c.  Consultants:see note  Procedures:see note  Culture/Microbiology  Component Value Date/Time   SDES  06/20/2020 1322    BLOOD RIGHT ANTECUBITAL Performed at [facility], [address] [place], [place] [address]    SPECREQUEST  06/20/2020 1322    BOTTLES DRAWN AEROBIC AND ANAEROBIC Blood Culture adequate volume Performed at [facility], [address] [place], [place] [address]    CULT  06/20/2020 1322    NO GROWTH < 12 HOURS Performed at [facility], [address] [place], [place] [address]    REPTSTATUS  PENDING 06/20/2020 1322    Other culture-see note  Medications: Scheduled Meds: . aspirin EC  81 mg Oral Daily  . atorvastatin  80 mg Oral q1800  . escitalopram  10 mg Oral Daily  . ferrous sulfate  325 mg Oral Q breakfast  . potassium chloride  40 mEq Oral Daily  . vancomycin  125 mg Oral QID   Continuous Infusions: . lactated ringers 50 mL/hr at 06/21/20 1112    Antimicrobials: Anti-infectives (From admission, onward)   Start     Dose/Rate Route Frequency Ordered Stop   06/20/20 1800  vancomycin (VANCOCIN) 50 mg/mL oral solution 125 mg        125 mg Oral 4 times daily 06/20/20 1600 06/30/20 1759     Objective: Vitals: Today's Vitals   06/21/20 1200 06/21/20 1230 06/21/20 1300 06/21/20 1330  BP: (!) 162/71 (!) 149/74 (!) 161/82 (!) 142/71  Pulse: 85 88 89 88  Resp: 20 (!) 22 16 (!) 21  Temp:      TempSrc:      SpO2: 95% 95% 94% 94%  Weight:      Height:        Intake/Output Summary (Last 24 hours) at 06/21/2020 1350 Last data filed at 06/20/2020 1456 Gross per 24 hour  Intake 500 ml  Output --  Net 500 ml   Filed Weights   06/20/20 1313  Weight: 59 kg   Weight change:   Intake/Output from previous day: 10/10 0701 - 10/11 0700 In: 500 [IV Piggyback:500] Out: -  Intake/Output this shift: No intake/output data recorded.  Examination: General exam: AAO x1,NAD, weak appearing. HEENT:Oral mucosa moist, Ear/Nose WNL grossly,dentition normal. Respiratory system: bilaterally clear,no wheezing or crackles,no use of accessory muscle, non tender. Cardiovascular system: S1 & S2 +, regular, No JVD. Gastrointestinal system: Abdomen soft, NT,ND, BS+. Nervous System:Alert, awake, moving extremities and grossly nonfocal Extremities: No edema, distal peripheral pulses palpable.  Skin: No rashes,no icterus. MSK: Normal muscle bulk,tone, power  Data Reviewed: I have personally reviewed following labs and imaging studies CBC: Recent Labs  Lab 06/20/20 1317  06/21/20 0557  WBC 7.8 6.5  NEUTROABS 6.3  --   HGB 11.4* 10.8*  HCT 40.2 33.1*  MCV 109.2* 95.9  PLT 101* 108*   Basic Metabolic Panel: Recent Labs  Lab 06/20/20 1317 06/20/20 1919 06/21/20 0557  NA 133*  --  134*  K 3.4*  --  3.4*  CL 101  --  101  CO2 22  --  24  GLUCOSE 98  --  92  BUN 14  --  12  CREATININE 0.73  --  0.67  CALCIUM 7.9*  --  8.2*  MG  --  1.8  --    GFR: Estimated Creatinine Clearance: 48.7 mL/min (by C-G formula based on SCr of 0.67 mg/dL). Liver Function Tests: Recent Labs  Lab 06/20/20 1317 06/21/20 0557  AST 17 17  ALT 9 10  ALKPHOS 87 78  BILITOT 0.8 0.9  PROT 5.9* 5.6*  ALBUMIN 3.3* 3.0*   No results for input(s): LIPASE, AMYLASE in the last 168 hours. No results for input(s): AMMONIA in the last 168 hours. Coagulation Profile: Recent Labs  Lab 06/20/20 1501  INR 1.1   Cardiac Enzymes: No results for input(s): CKTOTAL, CKMB, CKMBINDEX, TROPONINI in the last 168 hours. BNP (last 3 results) No results for input(s): PROBNP in the last 8760 hours. HbA1C: No results for input(s): HGBA1C in the last 72 hours. CBG: No results for input(s): GLUCAP in the last 168 hours. Lipid Profile: No results for input(s): CHOL, HDL, LDLCALC, TRIG, CHOLHDL, LDLDIRECT in the last 72 hours. Thyroid Function Tests: No results for input(s): TSH, T4TOTAL, FREET4, T3FREE, THYROIDAB in the last 72 hours. Anemia Panel: No results for input(s): VITAMINB12, FOLATE, FERRITIN, TIBC, IRON, RETICCTPCT in the last 72 hours. Sepsis Labs: Recent Labs  Lab 06/20/20 1317  LATICACIDVEN 1.0    Recent Results (from the past 240 hour(s))  Urine culture     Status: None   Collection Time: 06/20/20  1:17 PM   Specimen: In/Out Cath Urine  Result Value Ref Range Status   Specimen Description   Final    IN/OUT CATH URINE Performed at [facility] [address], [place], [place] [address]    Special Requests   Final    NONE Performed at  [facility], [address] [place], [place] [address]    Culture   Final    NO GROWTH Performed at [facility], [address], [place], [place] [address]    Report Status 06/21/2020 FINAL  Final  Blood Culture (routine x 2)     Status: None (Preliminary result)   Collection Time: 06/20/20  1:17 PM   Specimen: BLOOD  Result Value Ref Range Status   Specimen Description   Final    BLOOD BLOOD LEFT HAND Performed at [facility], [address], [place], [place] [address]    Special Requests   Final    BOTTLES DRAWN AEROBIC AND ANAEROBIC Blood Culture results may not be optimal due to an inadequate volume of blood received in culture bottles Performed at [facility], [address], [place], [place] [address]    Culture   Final    NO GROWTH < 24 HOURS Performed at [facility], [address] [place], [place] [address]    Report Status PENDING  Incomplete  Respiratory Panel by RT PCR (Flu A&B, Covid) -     Status: None   Collection Time: 06/20/20  1:18 PM   Specimen: Nasopharyngeal  Result Value Ref Range Status   SARS Coronavirus 2 by RT PCR NEGATIVE NEGATIVE Final    Comment: (NOTE) SARS-CoV-2 target nucleic acids are NOT DETECTED.  The SARS-CoV-2 RNA is generally detectable in upper respiratoy specimens during the acute phase of infection. The lowest concentration of SARS-CoV-2 viral copies this assay can detect is 131 copies/mL. A negative result does not preclude SARS-Cov-2 infection and should not be used as the sole basis for treatment or other patient management decisions. A negative result may occur with  improper specimen collection/handling, submission of specimen other than nasopharyngeal swab, presence of viral mutation(s) within the areas targeted by this assay, and inadequate number of viral copies (<131 copies/mL). A negative result must be combined with clinical observations, patient  history, and epidemiological information. The expected result is Negative.  Fact Sheet for Patients:  https://www.moore.com/https://www.fda.gov/media/142436/download  Fact Sheet for Healthcare Providers:  https://www.young.biz/https://www.fda.gov/media/142435/download  This test is no t yet approved or cleared by the [place] FDA and  has been authorized for detection and/or diagnosis of SARS-CoV-2 by FDA under an Emergency Use Authorization (EUA). This EUA will remain  in effect (meaning this test can be used) for the duration of the COVID-19 declaration under Section 564(b)(1) of the Act, 21 U.S.C. section 360bbb-3(b)(1), unless the authorization is terminated or revoked sooner.     Influenza A by PCR NEGATIVE NEGATIVE Final   Influenza B by PCR NEGATIVE NEGATIVE Final    Comment: (NOTE) The Xpert Xpress SARS-CoV-2/FLU/RSV assay is intended as an aid in  the diagnosis of influenza from Nasopharyngeal swab specimens and  should not be used as a sole basis for treatment. Nasal washings and  aspirates are unacceptable for Xpert Xpress SARS-CoV-2/FLU/RSV  testing.  Fact Sheet for Patients: https://www.moore.com/https://www.fda.gov/media/142436/download  Fact Sheet for Healthcare Providers: https://www.young.biz/https://www.fda.gov/media/142435/download  This test is not yet approved or cleared by the [place] FDA and  has been authorized for detection and/or diagnosis of SARS-CoV-2 by  FDA under an Emergency Use Authorization (EUA). This EUA will remain  in effect (meaning this test can be used) for the duration of the  Covid-19 declaration under Section 564(b)(1) of the Act, 21  U.S.C. section 360bbb-3(b)(1), unless the authorization is  terminated or revoked. Performed at [facility], [address] [place], [place] [address]   Blood Culture (routine x 2)     Status: None (Preliminary result)   Collection Time: 06/20/20  1:22 PM   Specimen: BLOOD  Result Value Ref Range Status   Specimen Description   Final    BLOOD RIGHT  ANTECUBITAL Performed at [facility], [address] [place], [place] [address]    Special Requests   Final    BOTTLES DRAWN AEROBIC AND ANAEROBIC Blood Culture adequate volume Performed at [facility], [address] [place], [place] [address]    Culture   Final    NO GROWTH < 12 HOURS Performed at [facility], [address] [place], [place] [address]    Report Status PENDING  Incomplete  C Difficile Quick Screen w PCR reflex     Status: Abnormal   Collection Time: 06/20/20  1:31 PM   Specimen: Stool  Result Value Ref Range Status   C Diff antigen POSITIVE (A) NEGATIVE Final   C Diff toxin POSITIVE (A) NEGATIVE Final   C Diff interpretation Toxin producing C. difficile detected.  Final    Comment: CRITICAL RESULT CALLED TO, READ BACK BY AND VERIFIED WITH: ZULETTA,K RN @1524  ON 06/20/20 JACKSON,K Performed at [facility], [address] [place], [place] [address]      Radiology Studies: CT Head Wo Contrast  Result Date: 06/20/2020 CLINICAL DATA:  Mental status change. EXAM: CT HEAD WITHOUT CONTRAST TECHNIQUE: Contiguous axial images were obtained from the base of the skull through the vertex without intravenous contrast. COMPARISON:  May 10, 2020 FINDINGS: Brain: No evidence of acute infarction, hemorrhage, hydrocephalus, extra-axial collection or mass lesion/mass effect. There is chronic diffuse atrophy. Chronic bilateral periventricular white matter small vessel ischemic changes are noted. Old infarcts are identified in the right basal ganglia and right thalamus. Vascular: No hyperdense vessel is noted. Skull: Normal. Negative for fracture or focal lesion. Sinuses/Orbits: No acute finding. Other: None IMPRESSION: 1. No focal acute intracranial abnormality identified. 2. Chronic diffuse atrophy. Chronic  bilateral periventricular white matter small vessel ischemic change. Old infarcts in the right basal ganglia  and right thalamus. Electronically Signed   By: Sherian Rein M.D.   On: 06/20/2020 16:07   DG Chest Port 1 View  Result Date: 06/20/2020 CLINICAL DATA:  Sepsis EXAM: PORTABLE CHEST 1 VIEW COMPARISON:  May 10, 2020 FINDINGS: The cardiomediastinal silhouette is unchanged in contour.Atherosclerotic calcifications of the aorta. No pleural effusion. No pneumothorax. No acute pleuroparenchymal abnormality. Visualized abdomen is unremarkable. Multilevel degenerative changes of the thoracic spine. IMPRESSION: No acute cardiopulmonary abnormality. Electronically Signed   By: Meda Klinefelter MD   On: 06/20/2020 14:14     LOS: 0 days   Lanae Boast, MD [facility]  06/21/2020, 1:50 PM

## 2020-06-21 NOTE — Progress Notes (Signed)
Pt's T 100.2, mild pain. Paged floor coverage to request  PRN Tylenol.

## 2020-06-21 NOTE — Progress Notes (Signed)
Updated patient sister Junita Push

## 2020-06-21 NOTE — ED Notes (Signed)
Pt had large bm on incontinence pad. Pt was bathed with a complete linen change. Pt placed on a new purewick at 60 mmHg. Pt repositioned and given warm blankets.

## 2020-06-21 NOTE — ED Notes (Signed)
Pt sister "[name]" contact number is [phone].

## 2020-06-21 NOTE — ED Notes (Signed)
Purewick canister contaminated. Canister changed and will collect urine accordingly when ready.

## 2020-06-22 LAB — CBC
HCT: 30.9 % — ABNORMAL LOW (ref 36.0–46.0)
Hemoglobin: 9.7 g/dL — ABNORMAL LOW (ref 12.0–15.0)
MCH: 31.1 pg (ref 26.0–34.0)
MCHC: 31.4 g/dL (ref 30.0–36.0)
MCV: 99 fL (ref 80.0–100.0)
Platelets: 104 10*3/uL — ABNORMAL LOW (ref 150–400)
RBC: 3.12 MIL/uL — ABNORMAL LOW (ref 3.87–5.11)
RDW: 13.5 % (ref 11.5–15.5)
WBC: 5.1 10*3/uL (ref 4.0–10.5)
nRBC: 0 % (ref 0.0–0.2)

## 2020-06-22 LAB — BASIC METABOLIC PANEL
Anion gap: 7 (ref 5–15)
BUN: 12 mg/dL (ref 8–23)
CO2: 25 mmol/L (ref 22–32)
Calcium: 8 mg/dL — ABNORMAL LOW (ref 8.9–10.3)
Chloride: 105 mmol/L (ref 98–111)
Creatinine, Ser: 0.74 mg/dL (ref 0.44–1.00)
GFR, Estimated: >60 mL/min (ref >60)
Glucose, Bld: 96 mg/dL (ref 70–99)
Potassium: 3.5 mmol/L (ref 3.5–5.1)
Sodium: 137 mmol/L (ref 135–145)

## 2020-06-22 MED ORDER — BOOST / RESOURCE BREEZE PO LIQD CUSTOM
1.0000 | ORAL | Status: DC
  Administered 2020-06-23 – 2020-06-25 (×3): 1 via ORAL

## 2020-06-22 MED ORDER — RESOURCE THICKENUP CLEAR PO POWD: ORAL | Status: DC | PRN

## 2020-06-22 MED ORDER — ENSURE ENLIVE PO LIQD
237.0000 mL | ORAL | Status: DC
  Administered 2020-06-23 – 2020-06-24 (×2): 237 mL via ORAL

## 2020-06-22 MED ORDER — ADULT MULTIVITAMIN W/MINERALS CH
1.0000 | ORAL_TABLET | Freq: Every day | ORAL | Status: DC
  Administered 2020-06-23 – 2020-06-25 (×3): 1 via ORAL
  Filled 2020-06-22 (×3): qty 1

## 2020-06-22 MED ORDER — ALPRAZOLAM 0.25 MG PO TABS
0.2500 mg | ORAL_TABLET | Freq: Two times a day (BID) | ORAL | Status: DC | PRN
  Administered 2020-06-22 – 2020-06-23 (×2): 0.25 mg via ORAL
  Filled 2020-06-22 (×2): qty 1

## 2020-06-22 NOTE — Progress Notes (Signed)
PROGRESS NOTE  Kristin Rush  WUJ:[id number] DOB: [birth year] DOA: 06/20/2020 PCP: Eartha Inch, MD   Chief Complaint  Patient presents with  . Fever  . Altered Mental Status   Brief Narrative: As per admitting: 79 y.o. female with medical history significant of dementia, NPH. Hx is through sister. She reports that over the last couple days the patient has been lethargic and sleeping all day. She has had poor appetite and difficult to arouse to eat. When they checked on her this morning, she was unresponsive and  incontinent of bowel and bladder. The family became concerned and brought her to the ED.  Her sister reports that she has generally been out of sorts of the last couple of months since her hip surgery and stroke. But, her lethargy is a new symptom for her. They report no aggravating or alleviating factors. They tried not additional treatment.    ED Course: CTH was negative. Lab work was largely ok. She was found to be c diff positive. She was started on vanc. TRH was called for admission.  Subjective: AAOX2 Mild temp today Daughter at bedside- reports she was having lots of diarrhea yesterday, and patient was being cleaned when she walked in earlier she says Urine clearing up not that dark  Assessment & Plan:  Acute encephalopathy likely metabolic, with history of dementia, AAO x1 at baseline, history of NPH followed with neurosurgery at [facility].  She has dehydration with diarrhea from C. difficile likely contributing.  Significantly improving.  Continue gentle IV hydration.  Continue C. difficile treatment.Neurosurgeon was consulted, who reviewed CT scan admission and no acute intervention at this time.  Clostridium difficile diarrhea: Continue oral vancomycin, contact isolation, IV fluid hydration supportive care.  She was on antibiotics for her recent UTI -had 3 tablets remaining from her 7 days course, currently on hold.  Will recheck UA today.  No obvious symptoms of  UTI at this time no fever no leukocytosis.  Dehydration-urine is clearing up.Continue gentle IV fluids.    Hypokalemia resolved. On 40 meq KCL potassium daily  Anemia: Hb overall stable.  Monitor.  Dementia AAO x1 at baseline, is much more alert awake oriented.  Close to baseline.  Continue Lexapro.  Fall precaution supportive care.    History of CVA/Hyperlipidemia, on Lipitor, aspirin.  History of dysphagia seen by speech, is improved regular/thin diet.Previously was on thickened diet.  Unstable gait and balance/NPH history:followed by [facility] and supposed to have a spinal tap done on outpatient basis-advised to f/u with her neurosurgery soon at discharge.  Chronic orthostatic hypotension,labile blood pressure followed by cardiology,has been on/off midodrine and BP meds.BP overall stable.Remains on IV fluids for dehydration.Off of salt tablet recently at home.    Thrombocytopenia avoiding heparin, plt 104k, monitor. Recent Labs  Lab 06/20/20 1317 06/21/20 0557 06/22/20 0317  PLT 101* 108* 104*   Nutrition: Diet Order            DIET SOFT Room service appropriate? Yes; Fluid consistency: Thin  Diet effective now                 Body mass index is 23.03 kg/m.  DVT prophylaxis: Place TED hose Start: 06/20/20 1919 Code Status:   Code Status: DNR  Family Communication: plan of care discussed with patient's daughter at the bedside.  Status is: admitted as Observation remains hospitalized for ongoing management of her C. difficile diarrhea, dehydration, remains on IV rehydration, at risk of dehydration due to diarrhea/C. difficile  and poor oral intake. Dispo: The patient is from: Home              Anticipated d/c is to: Home. PT/OT eval today              Anticipated d/c date is: 1 day              Patient currently is not medically stable to d/c.  Consultants:see note  Procedures:see note  Culture/Microbiology    Component Value Date/Time   SDES  06/20/2020  1322    BLOOD RIGHT ANTECUBITAL Performed at [facility], [address], [place], [place] [address]    SPECREQUEST  06/20/2020 1322    BOTTLES DRAWN AEROBIC AND ANAEROBIC Blood Culture adequate volume Performed at [facility], [address] [place], [place] [address]    CULT  06/20/2020 1322    NO GROWTH 2 DAYS Performed at [facility], [address] [place], [place] [address]    REPTSTATUS PENDING 06/20/2020 1322    Other culture-see note  Medications: Scheduled Meds: . aspirin EC  81 mg Oral Daily  . atorvastatin  80 mg Oral q1800  . escitalopram  10 mg Oral Daily  . ferrous sulfate  325 mg Oral Q breakfast  . potassium chloride  40 mEq Oral Daily  . vancomycin  125 mg Oral QID   Continuous Infusions: . lactated ringers 50 mL/hr at 06/22/20 0524    Antimicrobials: Anti-infectives (From admission, onward)   Start     Dose/Rate Route Frequency Ordered Stop   06/20/20 1800  vancomycin (VANCOCIN) 50 mg/mL oral solution 125 mg        125 mg Oral 4 times daily 06/20/20 1600 06/30/20 1759     Objective: Vitals: Today's Vitals   06/22/20 0209 06/22/20 0425 06/22/20 0655 06/22/20 1355  BP:   (!) 174/73 (!) 149/70  Pulse:   70 79  Resp:    16  Temp: 99.5 F (37.5 C)     TempSrc: Axillary     SpO2:   97% 98%  Weight:      Height:      PainSc:  Asleep      Intake/Output Summary (Last 24 hours) at 06/22/2020 1415 Last data filed at 06/22/2020 0515 Gross per 24 hour  Intake 968.6 ml  Output 502 ml  Net 466.6 ml   Filed Weights   06/20/20 1313  Weight: 59 kg   Weight change:   Intake/Output from previous day: 10/11 0701 - 10/12 0700 In: 968.6 [P.O.:120; I.V.:848.6] Out: 502 [Urine:501; Stool:1] Intake/Output this shift: No intake/output data recorded.  Examination: General exam: AAO x1,NAD, weak appearing. HEENT:Oral mucosa moist, Ear/Nose WNL grossly,dentition normal. Respiratory system: bilaterally  clear,no wheezing or crackles,no use of accessory muscle, non tender. Cardiovascular system: S1 & S2 +, regular, No JVD. Gastrointestinal system: Abdomen soft, NT,ND, BS+. Nervous System:Alert, awake, moving extremities and grossly nonfocal Extremities: No edema, distal peripheral pulses palpable.  Skin: No rashes,no icterus. MSK: Normal muscle bulk,tone, power  Data Reviewed: I have personally reviewed following labs and imaging studies CBC: Recent Labs  Lab 06/20/20 1317 06/21/20 0557 06/22/20 0317  WBC 7.8 6.5 5.1  NEUTROABS 6.3  --   --   HGB 11.4* 10.8* 9.7*  HCT 40.2 33.1* 30.9*  MCV 109.2* 95.9 99.0  PLT 101* 108* 104*   Basic Metabolic Panel: Recent Labs  Lab 06/20/20 1317 06/20/20 1919 06/21/20 0557 06/22/20 0317  NA 133*  --  134* 137  K 3.4*  --  3.4* 3.5  CL 101  --  101 105  CO2 22  --  24 25  GLUCOSE 98  --  92 96  BUN 14  --  12 12  CREATININE 0.73  --  0.67 0.74  CALCIUM 7.9*  --  8.2* 8.0*  MG  --  1.8  --   --    GFR: Estimated Creatinine Clearance: 48.7 mL/min (by C-G formula based on SCr of 0.74 mg/dL). Liver Function Tests: Recent Labs  Lab 06/20/20 1317 06/21/20 0557  AST 17 17  ALT 9 10  ALKPHOS 87 78  BILITOT 0.8 0.9  PROT 5.9* 5.6*  ALBUMIN 3.3* 3.0*   No results for input(s): LIPASE, AMYLASE in the last 168 hours. No results for input(s): AMMONIA in the last 168 hours. Coagulation Profile: Recent Labs  Lab 06/20/20 1501  INR 1.1   Cardiac Enzymes: No results for input(s): CKTOTAL, CKMB, CKMBINDEX, TROPONINI in the last 168 hours. BNP (last 3 results) No results for input(s): PROBNP in the last 8760 hours. HbA1C: No results for input(s): HGBA1C in the last 72 hours. CBG: No results for input(s): GLUCAP in the last 168 hours. Lipid Profile: No results for input(s): CHOL, HDL, LDLCALC, TRIG, CHOLHDL, LDLDIRECT in the last 72 hours. Thyroid Function Tests: No results for input(s): TSH, T4TOTAL, FREET4, T3FREE, THYROIDAB in  the last 72 hours. Anemia Panel: No results for input(s): VITAMINB12, FOLATE, FERRITIN, TIBC, IRON, RETICCTPCT in the last 72 hours. Sepsis Labs: Recent Labs  Lab 06/20/20 1317  LATICACIDVEN 1.0    Recent Results (from the past 240 hour(s))  Urine culture     Status: None   Collection Time: 06/20/20  1:17 PM   Specimen: In/Out Cath Urine  Result Value Ref Range Status   Specimen Description   Final    IN/OUT CATH URINE Performed at [facility], [address] [place], [place] [address]    Special Requests   Final    NONE Performed at [facility], [address], [place], [place] [address]    Culture   Final    NO GROWTH Performed at [facility], [address] [place], [place] [address]    Report Status 06/21/2020 FINAL  Final  Blood Culture (routine x 2)     Status: None (Preliminary result)   Collection Time: 06/20/20  1:17 PM   Specimen: BLOOD  Result Value Ref Range Status   Specimen Description   Final    BLOOD BLOOD LEFT HAND Performed at [facility], [address], [place], [place] [address]    Special Requests   Final    BOTTLES DRAWN AEROBIC AND ANAEROBIC Blood Culture results may not be optimal due to an inadequate volume of blood received in culture bottles Performed at [facility], [address] [place], [place] [address]    Culture   Final    NO GROWTH 2 DAYS Performed at [facility], [address], [place], [place] [address]    Report Status PENDING  Incomplete  Respiratory Panel by RT PCR (Flu A&B, Covid) -     Status: None   Collection Time: 06/20/20  1:18 PM   Specimen: Nasopharyngeal  Result Value Ref Range Status   SARS Coronavirus 2 by RT PCR NEGATIVE NEGATIVE Final    Comment: (NOTE) SARS-CoV-2 target nucleic acids are NOT DETECTED.  The SARS-CoV-2 RNA is generally  detectable in upper respiratoy specimens during the acute phase of infection.  The lowest concentration of SARS-CoV-2 viral copies this assay can detect is 131 copies/mL. A negative result does not preclude SARS-Cov-2 infection and should not be used as the sole basis for treatment or other patient management decisions. A negative result may occur with  improper specimen collection/handling, submission of specimen other than nasopharyngeal swab, presence of viral mutation(s) within the areas targeted by this assay, and inadequate number of viral copies (<131 copies/mL). A negative result must be combined with clinical observations, patient history, and epidemiological information. The expected result is Negative.  Fact Sheet for Patients:  https://www.moore.com/  Fact Sheet for Healthcare Providers:  https://www.young.biz/  This test is no t yet approved or cleared by the [place] FDA and  has been authorized for detection and/or diagnosis of SARS-CoV-2 by FDA under an Emergency Use Authorization (EUA). This EUA will remain  in effect (meaning this test can be used) for the duration of the COVID-19 declaration under Section 564(b)(1) of the Act, 21 U.S.C. section 360bbb-3(b)(1), unless the authorization is terminated or revoked sooner.     Influenza A by PCR NEGATIVE NEGATIVE Final   Influenza B by PCR NEGATIVE NEGATIVE Final    Comment: (NOTE) The Xpert Xpress SARS-CoV-2/FLU/RSV assay is intended as an aid in  the diagnosis of influenza from Nasopharyngeal swab specimens and  should not be used as a sole basis for treatment. Nasal washings and  aspirates are unacceptable for Xpert Xpress SARS-CoV-2/FLU/RSV  testing.  Fact Sheet for Patients: https://www.moore.com/  Fact Sheet for Healthcare Providers: https://www.young.biz/  This test is not yet approved or cleared by the [place] FDA and  has been authorized for detection and/or diagnosis of SARS-CoV-2 by  FDA  under an Emergency Use Authorization (EUA). This EUA will remain  in effect (meaning this test can be used) for the duration of the  Covid-19 declaration under Section 564(b)(1) of the Act, 21  U.S.C. section 360bbb-3(b)(1), unless the authorization is  terminated or revoked. Performed at [facility], [address], [place], [place] [address]   Blood Culture (routine x 2)     Status: None (Preliminary result)   Collection Time: 06/20/20  1:22 PM   Specimen: BLOOD  Result Value Ref Range Status   Specimen Description   Final    BLOOD RIGHT ANTECUBITAL Performed at [facility], [address], [place], [place] [address]    Special Requests   Final    BOTTLES DRAWN AEROBIC AND ANAEROBIC Blood Culture adequate volume Performed at [facility], [address], [place], [place] [address]    Culture   Final    NO GROWTH 2 DAYS Performed at [facility], [address], [place], [place] [address]    Report Status PENDING  Incomplete  C Difficile Quick Screen w PCR reflex     Status: Abnormal   Collection Time: 06/20/20  1:31 PM   Specimen: Stool  Result Value Ref Range Status   C Diff antigen POSITIVE (A) NEGATIVE Final   C Diff toxin POSITIVE (A) NEGATIVE Final   C Diff interpretation Toxin producing C. difficile detected.  Final    Comment: CRITICAL RESULT CALLED TO, READ BACK BY AND VERIFIED WITH: ZULETTA,K RN @1524  ON 06/20/20 JACKSON,K Performed at [facility], [address], [place], [place]      Radiology Studies: CT Head Wo Contrast  Result Date: 06/20/2020  CLINICAL DATA:  Mental status change. EXAM: CT HEAD WITHOUT CONTRAST TECHNIQUE: Contiguous axial images were obtained from the base of the skull through the vertex without intravenous contrast. COMPARISON:  May 10, 2020 FINDINGS: Brain: No evidence of acute infarction, hemorrhage, hydrocephalus, extra-axial collection or  mass lesion/mass effect. There is chronic diffuse atrophy. Chronic bilateral periventricular white matter small vessel ischemic changes are noted. Old infarcts are identified in the right basal ganglia and right thalamus. Vascular: No hyperdense vessel is noted. Skull: Normal. Negative for fracture or focal lesion. Sinuses/Orbits: No acute finding. Other: None IMPRESSION: 1. No focal acute intracranial abnormality identified. 2. Chronic diffuse atrophy. Chronic bilateral periventricular white matter small vessel ischemic change. Old infarcts in the right basal ganglia and right thalamus. Electronically Signed   By: Sherian ReinWei-Chen  Lin M.D.   On: 06/20/2020 16:07     LOS: 1 day   Lanae Boastamesh Rigel Filsinger, MD [facility]  06/22/2020, 2:15 PM

## 2020-06-22 NOTE — Progress Notes (Signed)
Initial Nutrition Assessment  RD working remotely.  DOCUMENTATION CODES:   Not applicable  INTERVENTION:  - will order Boost Breeze once/day, each supplement provides 250 kcal and 9 grams of protein. - will order Ensure Enlive once/day, each supplement provides 350 kcal and 20 grams of protein. - will order Magic Cup with dinner meals, each supplement provides 290 kcal and 9 grams of protein. - will order 1 tablet multivitamin with minerals. - NFPE to be completed at follow-up.    NUTRITION DIAGNOSIS:   Inadequate oral intake related to acute illness, lethargy/confusion as evidenced by per patient/family report.  GOAL:   Patient will meet greater than or equal to 90% of their needs  MONITOR:   PO intake, Supplement acceptance, Labs, Weight trends  REASON FOR ASSESSMENT:   Malnutrition Screening Tool  ASSESSMENT:   79 y.o. female with medical history of dementia, migraines, disc degeneration, lumbar spondylosis, hearing loss, osteoporosis, bilateral hands osteoarthritis, CHF, chronic brain-hydrocephalus syndrome, and CVA. Her sister reported to ED staff that patient has been lethargic and sleeping all for the few days PTA and that patient had a poor appetite and it was difficult to get her to eat.  She is noted to be a/o to self only. The only meal documentation was 0% of dinner yesterday. Diet was advanced from NPO to CLD on 10/10 at 1920 and then to Soft yesterday at 1030.   Information in H&P indicates that patient's sister reported that patient had a poor appetite for several days PTA.   Weight on 10/10 was documented as 130 lb, which appears to be a stated weight. Weight was the same on 05/11/20 and 05/10/20. Weight on 03/16/20 was 135 lb. This indicates 5 lb weight loss (3.7% body weight) in the past 3 months.   Per notes: - c.diff - dehydration on admission - anemia - hx of dementia with current mentation being close to baseline - hx of dysphagia--seen by SLP today and  recommendation for regular, thin liquids - unstable gait and balance   Labs reviewed; Ca: 8 mg/dl. Medications reviewed; 325 mg ferrous sulfate/day, 40 mEq KCl/day. IVF; LR @ 50 ml/hr.     NUTRITION - FOCUSED PHYSICAL EXAM:  unable to complete at this time.   Diet Order:   Diet Order            DIET SOFT Room service appropriate? Yes; Fluid consistency: Thin  Diet effective now                 EDUCATION NEEDS:   No education needs have been identified at this time  Skin:  Skin Assessment: Reviewed RN Assessment  Last BM:  10/12 (type 7)  Height:   Ht Readings from Last 1 Encounters:  06/20/20 5\' 3"  (1.6 m)    Weight:   Wt Readings from Last 1 Encounters:  06/20/20 59 kg     Estimated Nutritional Needs:  Kcal:  1500-1700 kcal Protein:  65-75 grams Fluid:  >/= 2 L/day     08/20/20, MS, RD, LDN, CNSC Inpatient Clinical Dietitian RD pager # available in [facility]  After hours/weekend pager # available in [facility]

## 2020-06-22 NOTE — TOC Initial Note (Signed)
Transition of Care [facility]) - Initial/Assessment Note    Patient Details  Name: Kristin Rush MRN: [id number] Date of Birth: October 03, 1940  [facility] CM/SW Contact:    Darleene Cleaver, LCSW Phone Number: 06/22/2020, 2:10 PM  Clinical Narrative:                  Patient is a 79 year old female who is alert and oriented x1.  Patient has dementia and lives with her sister Elita Quick [phone].  Due to dementia, assessment completed by speaking to patient's sister.  Patient was just recently in the hospital, and was discharged in September.  Patient was set up with home health through [facility].  CSW asked patient's sister if they are satisfied with the home health services, and per patient's sister, she said they are wonderful and want to keep services through them.  CSW asked if there is any equipment needs, and per sister, she does not feel like they need anything else.  CSW to continue to follow patient's progress throughout discharge planning.   Expected Discharge Plan: Home w Home Health Services Barriers to Discharge: Continued Medical Work up   Patient Goals and CMS Choice Patient states their goals for this hospitalization and ongoing recovery are:: To return back home with home health. CMS Medicare.gov Compare Post Acute Care list provided to:: Patient Represenative (must comment) Choice offered to / list presented to : Sibling  Expected Discharge Plan and Services Expected Discharge Plan: Home w Home Health Services In-house Referral: Clinical Social Work   Post Acute Care Choice: Home Health Living arrangements for the past 2 months: Single Family Home                   DME Agency: NA         HH Agency: [facility] Date HH Agency Contacted: 06/22/20 Time HH Agency Contacted: 1408 Representative spoke with at [facility] Agency: Amy  Prior Living Arrangements/Services Living arrangements for the past 2 months: Single Family Home Lives with::  Siblings Patient language and need for interpreter reviewed:: Yes Do you feel safe going back to the place where you live?: Yes      Need for Family Participation in Patient Care: Yes (Comment) Care giver support system in place?: Yes (comment) Current home services: Home PT, Home OT, Home RN Criminal Activity/Legal Involvement Pertinent to Current Situation/Hospitalization: No - Comment as needed  Activities of Daily Living Home Assistive Devices/Equipment: Hospital bed, Walker (specify type), Bedside commode/3-in-1, Shower chair with back ADL Screening (condition at time of admission) Patient's cognitive ability adequate to safely complete daily activities?: Yes Is the patient deaf or have difficulty hearing?: Yes Does the patient have difficulty seeing, even when wearing glasses/contacts?: No Does the patient have difficulty concentrating, remembering, or making decisions?: Yes Patient able to express need for assistance with ADLs?: No Does the patient have difficulty dressing or bathing?: Yes Independently performs ADLs?: No Communication: Needs assistance Is this a change from baseline?: Pre-admission baseline Dressing (OT): Needs assistance Is this a change from baseline?: Pre-admission baseline Grooming: Needs assistance Is this a change from baseline?: Pre-admission baseline Feeding: Needs assistance Is this a change from baseline?: Pre-admission baseline Bathing: Needs assistance Is this a change from baseline?: Pre-admission baseline Toileting: Needs assistance Is this a change from baseline?: Pre-admission baseline In/Out Bed: Needs assistance Is this a change from baseline?: Pre-admission baseline Walks in Home: Dependent Is this a change from baseline?: Pre-admission baseline Does the patient  have difficulty walking or climbing stairs?: Yes Weakness of Legs: Both Weakness of Arms/Hands: Both  Permission Sought/Granted Permission sought to share information with :  Family Supports Permission granted to share information with : Yes, Release of Information Signed  Share Information with NAME: [name] Sister   [phone] or Tacey Ruiz [phone] or Meela, [place]   [phone]  Permission granted to share info w AGENCY: Home Health Agency        Emotional Assessment Appearance:: Appears stated age   Affect (typically observed): Calm, Accepting, Appropriate Orientation: : Oriented to Self Alcohol / Substance Use: Not Applicable Psych Involvement: No (comment)  Admission diagnosis:  Weakness [R53.1] Clostridium difficile diarrhea [A04.72] C. difficile diarrhea [A04.72] Altered mental status, unspecified altered mental status type [R41.82] Patient Active Problem List   Diagnosis Date Noted  . C. difficile diarrhea 06/21/2020  . Clostridium difficile diarrhea 06/20/2020  . Fracture of femoral neck, left (HCC) 05/11/2020  . Fall at home, initial encounter 05/11/2020  . Chronic diastolic CHF (congestive heart failure) (HCC) 05/11/2020  . 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 Hip fracture (HCC) 05/11/2020  . CVA (cerebral vascular accident) (HCC) 04/29/2020  . Acute CVA (cerebrovascular accident) (HCC) 04/28/2020  . 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 Frequent falls 03/17/2020  . Postural dizziness with presyncope 03/17/2020  . Essential hypertension 03/17/2020  . Right ear pain 03/17/2020  . Excessive cerumen in both ear canals 03/17/2020  . Syncope and collapse 03/17/2020  . 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 Lumbar spondylosis 02/18/2018  . Primary osteoarthritis of both hands 07/11/2017  . Orthostatic hypotension 10/23/2016  . Osteoporosis 04/16/2014  . Migraines 10/09/2011   PCP:  Eartha Inch, MD Pharmacy:   [phone] [place], [place] - [address] [place] [address] Phone:  [phone] Fax: [phone]     Social Determinants of Health (SDOH) Interventions    Readmission Risk Interventions Readmission Risk Prevention Plan 05/14/2020 05/12/2020  Transportation Screening Complete Complete  PCP or Specialist Appt within 5-7 Days - Complete  Home Care Screening - Complete  Medication Review (RN CM) - Complete  Social Work Consult for Recovery Care Planning/Counseling Complete -  Palliative Care Screening Not Applicable -  Medication Review Oceanographer) Complete -  Some recent data might be hidden

## 2020-06-22 NOTE — Evaluation (Signed)
Clinical/Bedside Swallow Evaluation Patient Details  Name: Kristin Rush MRN: [id number] Date of Birth: Oct 01, 1940  Today's Date: 06/22/2020 Time: SLP Start Time (ACUTE ONLY): 1215 SLP Stop Time (ACUTE ONLY): 1305 SLP Time Calculation (min) (ACUTE ONLY): 50 min  Past Medical History:  Past Medical History:  Diagnosis Date  . 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 CHF (congestive heart failure) (HCC)   . Chronic brain-hydrocephalus syndrome (HCC)   . CVA (cerebral vascular accident) (HCC) 04/2020  . 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 Lumbar spondylosis 02/18/2018  . Migraine   . Migraines 10/09/2011  . Orthostatic hypotension 11/15/16   has passed out when up  . Osteoporosis 04/16/2014  . Primary osteoarthritis of both hands 07/11/2017   Past Surgical History:  Past Surgical History:  Procedure Laterality Date  . HIP ARTHROPLASTY Left 05/11/2020   Procedure: ARTHROPLASTY BIPOLAR HIP (HEMIARTHROPLASTY);  Surgeon: Teryl Lucy, MD;  Location: [facility];  Service: Orthopedics;  Laterality: Left;   HPI:  pt is a 79 yo female adm to [facility] with decreased po intake, fevers, cough - COVID negative.  Pt found to have Cdiff.   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  Pt working with [facility] [place] per sister Roxy.    Pt underwent an MBS when she was at [facility] and was placed on puree/honey thick liquids.  She had aspiration with nectar with cough response tht was effective to clear.  She has not been consuning thickened drinks for the last two weeks but does occasionally cough with liquids per her sister Roxy *who helps to care for her.  Swallow eval ordered.   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  Today she only demonstrates mild left facial asymmetry otherwise no focal CN deficits.  She consuming water via cup/straw, Ensure via  straw, applesauce via tsp and graham crackers. No indication of aspiration with all intake until the 3 ounce Yale water challenge conducted. Pt required rest break before 3 ounces swallowed and then coughed within a few seconds.  Sister advised pt coughs more with liquids when eating meals.    Recommend pt consume Ensure with meals and assure adequate water intake between. Reviewed 3 pillars of asp pna with pt and her sister - of which she has only one characteristic *dysphagia* but has intact oral care and immune system.    Do not recommend to repeat an MBS as pt does not use thickened liquids and she currently is tolerating suspected episodic aspiration.   Also likely her swallow ability has improved with therapy and spontaneous recovery post-stroke in August 2021.  Using teach back, reviewed 3 pillars of asp pna, advise for aspiration mitigation.    Will follow up x1 to assure education completed and pt tolerating po.  Thanks for this consult. SLP Visit Diagnosis: Dysphagia, oropharyngeal phase (R13.12)    Aspiration Risk  Mild aspiration risk    Diet Recommendation Regular;Thin liquid (have pt drink Ensure with meals if coughing with thin)   Liquid Administration via: Cup;Straw Medication Administration: Other (Comment) (crush medications if large) Supervision: Patient able to self feed Compensations: Slow rate;Small sips/bites Postural Changes: Seated upright at 90 degrees;Remain upright for at least 30 minutes after po intake    Other  Recommendations Oral Care Recommendations: Oral care BID   Follow up Recommendations        Frequency and  Duration min 1 x/week  1 week       Prognosis Prognosis for Safe Diet Advancement: Good Barriers to Reach Goals: Cognitive deficits      Swallow Study   General Date of Onset: 05/01/20 HPI: pt is a 79 yo female adm to [facility] with decreased po intake, fevers, cough - COVID negative.  Pt found to have Cdiff.   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  Pt working with [facility] [place] per sister Roxy.    Pt underwent an MBS when she was at [facility] and was placed on puree/honey thick liquids.  She had aspiration with nectar with cough response tht was effective to clear.  She has not been consuning thickened drinks for the last two weeks but does occasionally cough with liquids per her sister Roxy *who helps to care for her.  Swallow eval ordered. Type of Study: Bedside Swallow Evaluation Previous Swallow Assessment: see HPI Diet Prior to this Study: Dysphagia 3 (soft);Thin liquids (changed to nectar as sister reported pt was using thickened liquids a few days ago) Temperature Spikes Noted: No Respiratory Status: Room air History of Recent Intubation: No Behavior/Cognition: Alert;Cooperative;Pleasant mood Oral Cavity Assessment: Within Functional Limits Oral Care Completed by SLP: No Oral Cavity - Dentition: Adequate natural dentition Vision: Functional for self-feeding Self-Feeding Abilities: Able to feed self Patient Positioning: Upright in bed Baseline Vocal Quality: Normal (normal for pt  - "family is soft spoken" per Roxy) Volitional Cough: Strong Volitional Swallow: Able to elicit    Oral/Motor/Sensory Function Overall Oral Motor/Sensory Function: Other (comment) (subtle facial asymmetry on the left otherwise unremarkable)   Ice Chips Ice chips: Not tested   Thin Liquid Thin Liquid: Impaired Presentation: Self Fed;Cup;Straw Pharyngeal  Phase Impairments: Cough - Immediate Other Comments: cough noted with 3 ounce Yale water challenge - pt consumed approx 2 ounces - stopped intake and proceeded to cough, NO s/s of aspiration with thin via cup, single boluses of thin    Nectar Thick Nectar Thick Liquid: Within functional limits Presentation: Self Fed;Straw   Honey Thick Honey Thick Liquid: Not tested   Puree Puree: Within functional limits Presentation: Self Fed;Spoon   Solid     Solid: Within  functional limits Presentation: Self Fed      Chales Abrahams 06/22/2020,1:54 PM   Rolena Infante, MS [facility] SLP Acute Rehab Services Office [phone] Pager [phone]

## 2020-06-23 ENCOUNTER — Inpatient Hospital Stay (HOSPITAL_COMMUNITY): Payer: Medicare Other

## 2020-06-23 DIAGNOSIS — R531 Weakness: Secondary | ICD-10-CM

## 2020-06-23 DIAGNOSIS — R4182 Altered mental status, unspecified: Secondary | ICD-10-CM

## 2020-06-23 NOTE — Evaluation (Signed)
Occupational Therapy Evaluation Patient Details Name: Kristin Rush MRN: [id number] DOB: 11/29/1940 Today's Date: 06/23/2020    History of Present Illness 79 y.o. female admitted with lethargy, incontinence, poor appetite. Dx of c diff, dehydration, metabolic encephalopathy. 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   Clinical Impression   Kristin Rush is a 79 year old woman admitted to hospital with above pertinent medical history. On evaluation patient presents with generalized weakness, decreased activity tolerance, impaired balance, and impaired cognition from prior stroke resulting in decreased ability to perform baseline mobility and assistance with ADLs. Patient will benefit from skilled OT services while in hospital to improve deficits in order to return home at discharge. Plan with sister is for patient to return home with family and [facility] services.    Follow Up Recommendations  Home health OT    Equipment Recommendations  None recommended by OT    Recommendations for Other Services       Precautions / Restrictions Precautions Precautions: Fall;Posterior Hip Precaution Comments: L inattention secondary to recent CVA Restrictions Weight Bearing Restrictions: No Other Position/Activity Restrictions: WBAT Lt LE      Mobility Bed Mobility Overal bed mobility: Needs Assistance Bed Mobility: Rolling;Sidelying to Sit Rolling: Mod assist Sidelying to sit: Mod assist Supine to sit: Mod assist Sit to supine: Max assist;+2 for physical assistance;+2 for safety/equipment   General bed mobility comments: verbal/manual cues to initiate movement and for technique; pt rolled L and R for pericare 2* incontinence of BM  Transfers Overall transfer level: Needs assistance Equipment used: Rolling walker (2 wheeled) Transfers: Sit to/from [employer] Sit to Stand: +2 physical assistance;+2  safety/equipment;Mod assist Stand pivot transfers: +2 physical assistance;+2 safety/equipment;Mod assist       General transfer comment: max verbal and manual cues for technique , pt has very narrow base of support, assist for posterior lean; SPT x 2 bed to 3 in 1 then to recliner    Balance Overall balance assessment: Needs assistance Sitting-balance support: Feet supported;Bilateral upper extremity supported Sitting balance-Leahy Scale: Poor Sitting balance - Comments: posterior lean requiring min A, then progressed to supervision. tending to prop on LUE Postural control: Posterior lean Standing balance support: Bilateral upper extremity supported Standing balance-Leahy Scale: Poor Standing balance comment: BUE support needed on RW, posterior lean                           ADL either performed or assessed with clinical judgement   ADL Overall ADL's : Needs assistance/impaired Eating/Feeding: Set up;Sitting   Grooming: Set up;Sitting;Cueing for sequencing   Upper Body Bathing: Set up;Sitting;Moderate assistance;Cueing for sequencing   Lower Body Bathing: Maximal assistance;Set up;Cueing for sequencing;Sit to/from stand   Upper Body Dressing : Moderate assistance;Sitting;Cueing for sequencing   Lower Body Dressing: Total assistance;Sit to/from stand;+2 for safety/equipment   Toilet Transfer: +2 for safety/equipment;Moderate assistance;+2 for physical assistance;BSC;Stand-pivot Toilet Transfer Details (indicate cue type and reason): Posterior lean in patient requiring assistance of two for pivot transfer to [facility], also requied tactile cues for hand placement on walker and BSC to initiate transfers Toileting- Clothing Manipulation and Hygiene: Total assistance;Sit to/from stand;Bed level Toileting - Clothing Manipulation Details (indicate cue type and reason): Total assist for pericare at bed leveld (patient with frequent BMs due to c.diff) and with toileting on [facility] as  well     Functional mobility during ADLs: +2 for safety/equipment;+2 for physical assistance;Rolling  walker       Vision   Additional Comments: Left inattention     Perception     Praxis      Pertinent Vitals/Pain Pain Assessment: No/denies pain     Hand Dominance Right   Extremity/Trunk Assessment Upper Extremity Assessment Upper Extremity Assessment: RUE deficits/detail;LUE deficits/detail RUE Deficits / Details: WNL ROM, 5/5 strength RUE Sensation: WNL (sensation appears functional) RUE Coordination: WNL LUE Deficits / Details: 4/5 shoulder strength, 4+/5 bicep, 4-/5 tricep, 5/5 wrist, 3+/5 grip LUE Sensation:  (appears functional) LUE Coordination: decreased fine motor (but functional)   Lower Extremity Assessment Lower Extremity Assessment: Defer to PT evaluation   Cervical / Trunk Assessment Cervical / Trunk Assessment: Kyphotic   Communication Communication Communication: No difficulties;HOH   Cognition Arousal/Alertness: Awake/alert Behavior During Therapy: Flat affect Overall Cognitive Status: History of cognitive impairments - at baseline Area of Impairment: Following commands;Problem solving;Memory;Safety/judgement                 Orientation Level: Place;Time;Situation   Memory: Decreased short-term memory;Decreased recall of precautions Following Commands: Follows one step commands inconsistently Safety/Judgement: Decreased awareness of safety;Decreased awareness of deficits   Problem Solving: Slow processing;Decreased initiation;Difficulty sequencing;Requires verbal cues General Comments: patient requires increased processing time, increased difficulty with transitional movements   General Comments       Exercises     Shoulder Instructions      Home Living Family/patient expects to be discharged to:: Private residence Living Arrangements: Other relatives (sister "Kristin Rush") Available Help at Discharge: Available 24  hours/day;Family;Personal care attendant Type of Home: House Home Access: Ramped entrance     Home Layout: Two level;Able to live on main level with bedroom/bathroom;Full bath on main level     Bathroom Shower/Tub: Tub/shower unit   Bathroom Toilet: Handicapped height Bathroom Accessibility: Yes   Home Equipment: Walker - 2 wheels;Walker - 4 wheels;Cane - single point;Grab bars - toilet;Tub bench;Grab bars - tub/shower;Hand held shower head;Bedside commode;Wheelchair - manual;Walker - standard;Hospital bed   Additional Comments: pt lives with her sister and they have hired a caregiver to be with her 7 days/week for ~8-10 hours per day (daytime hrs). Has 5 sisters to help her.      Prior Functioning/Environment Level of Independence: Needs assistance  Gait / Transfers Assistance Needed: pt ambulates with RW and assist from 1 person in home. Has been recieving HHPT due to recent CVA/hip hemi. ADL's / Homemaking Assistance Needed: pt receives assist to dress (bra, socks shoes), sits on shower bench for showers with assist of sisters to wash , uses BSC at bedside and toilet riser in bathroom with A, walks with RW and assistance household distances, uses WC when going out            OT Problem List: Decreased strength;Decreased range of motion;Impaired balance (sitting and/or standing);Decreased cognition;Decreased coordination;Decreased safety awareness;Decreased activity tolerance;Pain;Decreased knowledge of use of DME or AE;Decreased knowledge of precautions      OT Treatment/Interventions: Self-care/ADL training;DME and/or AE instruction;Patient/family education;Balance training;Therapeutic activities;Neuromuscular education;Cognitive remediation/compensation    OT Goals(Current goals can be found in the care plan section) Acute Rehab OT Goals Patient Stated Goal: return to walking OT Goal Formulation: With family Time For Goal Achievement: 06/10/20 Potential to Achieve Goals:  Fair  OT Frequency: Min 2X/week   Barriers to D/C:            Co-evaluation PT/OT/SLP Co-Evaluation/Treatment: Yes   PT goals addressed during session: Mobility/safety with mobility OT goals addressed during session: ADL's  and self-care      AM-PAC OT "6 Clicks" Daily Activity     Outcome Measure Help from another person eating meals?: A Little Help from another person taking care of personal grooming?: A Little Help from another person toileting, which includes using toliet, bedpan, or urinal?: Total Help from another person bathing (including washing, rinsing, drying)?: A Lot Help from another person to put on and taking off regular upper body clothing?: A Lot Help from another person to put on and taking off regular lower body clothing?: Total 6 Click Score: 12   End of Session Equipment Utilized During Treatment: Gait belt;Rolling walker Nurse Communication: Mobility status  Activity Tolerance: Treatment limited secondary to medical complications (Comment) Patient left: in bed;with bed alarm set;with call bell/phone within reach;with family/visitor present  OT Visit Diagnosis: Unsteadiness on feet (R26.81);Other abnormalities of gait and mobility (R26.89);Muscle weakness (generalized) (M62.81);Other symptoms and signs involving cognitive function                Time: 5456-2563 OT Time Calculation (min): 36 min Charges:  OT General Charges $OT Visit: 1 Visit OT Evaluation $OT Eval Moderate Complexity: 1 Mod  Ashara Lounsbury, OTR/L Acute Care Rehab Services  Office [phone] Pager: [phone]   Kelli Churn 06/23/2020, 1:22 PM

## 2020-06-23 NOTE — Progress Notes (Signed)
Patient remains confused and cooperative. Urine yellow, lighter in coloration than yesterday. Continued diarrhea, mushy consistency.

## 2020-06-23 NOTE — Progress Notes (Signed)
I have been notified patient is in hospital.   I will plan to see tomorrow for post-op check.    New x-ray ordered of her left hip.  Eulas Post, MD

## 2020-06-23 NOTE — Progress Notes (Signed)
[facility]  PROGRESS NOTE  Myan Locatelli XBJ:[id number] DOB: 07-Oct-1940 DOA: 06/20/2020 PCP: Eartha Inch, MD   Brief HPI:   79 year old female with history of dementia, NPH came to ED with complaints of lethargy and hypersomnolence.  Patient was found to be positive for C. Difficile,  started on vancomycin.    Subjective   Patient seen and examined, continues to have diarrhea.  Though diarrhea is slowing down.   Assessment/Plan:     1. Acute metabolic encephalopathy-patient has history of dementia at baseline.  Also has history of NPH, followed by neurosurgery at [facility].  Neurosurgery was consulted who reviewed CT scan of the head on admission and recommended no acute intervention at this time.  Patient is back to baseline. 2. C. difficile colitis-patient started on oral vancomycin, continue contact isolation.  IV LR at 50 mill per hour. 3. History of CVA/hyperlipidemia-continue Lipitor, aspirin 4. History of dysphagia-seen by speech therapy.  Continue regular/thin diet 5. Unstable gait/NP history-followed by [facility] and is supposed to have spinal tap done on outpatient basis.  Follow-up neurosurgery at discharge. 6. Chronic orthostatic hypotension-followed by cardiology, has been on and off midodrine and BP meds. 7. Thrombocytopenia-hold Lovenox as patient is borderline thrombocytopenia.  Follow platelet count in a.m.     COVID-19 Labs  No results for input(s): DDIMER, FERRITIN, LDH, CRP in the last 72 hours.  Lab Results  Component Value Date   SARSCOV2NAA NEGATIVE 06/20/2020   SARSCOV2NAA NEGATIVE 05/11/2020   SARSCOV2NAA NEGATIVE 04/28/2020   SARSCOV2NAA NEGATIVE 03/22/2020     Scheduled medications:   . aspirin EC  81 mg Oral Daily  . atorvastatin  80 mg Oral q1800  . escitalopram  10 mg Oral Daily  . feeding supplement  1 Container Oral Q24H  . feeding supplement  237 mL Oral Q24H  . ferrous sulfate  325 mg Oral Q breakfast  .  multivitamin with minerals  1 tablet Oral Daily  . potassium chloride  40 mEq Oral Daily  . vancomycin  125 mg Oral QID         CBG: No results for input(s): GLUCAP in the last 168 hours.  SpO2: 99 %    CBC: Recent Labs  Lab 06/20/20 1317 06/21/20 0557 06/22/20 0317  WBC 7.8 6.5 5.1  NEUTROABS 6.3  --   --   HGB 11.4* 10.8* 9.7*  HCT 40.2 33.1* 30.9*  MCV 109.2* 95.9 99.0  PLT 101* 108* 104*    Basic Metabolic Panel: Recent Labs  Lab 06/20/20 1317 06/20/20 1919 06/21/20 0557 06/22/20 0317  NA 133*  --  134* 137  K 3.4*  --  3.4* 3.5  CL 101  --  101 105  CO2 22  --  24 25  GLUCOSE 98  --  92 96  BUN 14  --  12 12  CREATININE 0.73  --  0.67 0.74  CALCIUM 7.9*  --  8.2* 8.0*  MG  --  1.8  --   --      Liver Function Tests: Recent Labs  Lab 06/20/20 1317 06/21/20 0557  AST 17 17  ALT 9 10  ALKPHOS 87 78  BILITOT 0.8 0.9  PROT 5.9* 5.6*  ALBUMIN 3.3* 3.0*     Antibiotics: Anti-infectives (From admission, onward)   Start     Dose/Rate Route Frequency Ordered Stop   06/20/20 1800  vancomycin (VANCOCIN) 50 mg/mL oral solution 125 mg        125 mg  Oral 4 times daily 06/20/20 1600 06/30/20 1759       DVT prophylaxis: SCDs  Code Status: DNR  Family Communication: Discussed with patient's sister at bedside    Status is: Inpatient  Dispo: The patient is from: Home              Anticipated d/c is to: Home              Anticipated d/c date is: 06/25/2020              Patient currently not medically stable for discharge  Barrier to discharge-ongoing diarrhea due to C. difficile colitis      Consultants:    Procedures:     Objective   Vitals:   06/22/20 1355 06/22/20 2231 06/23/20 0419 06/23/20 1337  BP: (!) 149/70 (!) 169/73 (!) 168/51 (!) 156/83  Pulse: 79 87 75 78  Resp: 16 16 16 16   Temp: 98.7 F (37.1 C) 98.4 F (36.9 C) 97.8 F (36.6 C) (!) 97.5 F (36.4 C)  TempSrc: Axillary Oral Axillary Oral  SpO2: 98%  97% 99%   Weight:      Height:        Intake/Output Summary (Last 24 hours) at 06/23/2020 1710 Last data filed at 06/23/2020 1451 Gross per 24 hour  Intake 900 ml  Output 850 ml  Net 50 ml    10/11 1901 - 10/13 0700 In: 1262.5 [P.O.:120; I.V.:1142.5] Out: 702 [Urine:701]  Filed Weights   06/20/20 1313  Weight: 59 kg    Physical Examination:    General: Appears in no acute distress  Cardiovascular: S1-S2, regular  Respiratory: Clear to auscultation bilaterally  Abdomen: Abdomen is soft, nontender, no organomegaly  Extremities: No edema in the lower extremities  Neurologic: Alert, oriented x3, intact insight and judgment    Data Reviewed:   Recent Results (from the past 240 hour(s))  Urine culture     Status: None   Collection Time: 06/20/20  1:17 PM   Specimen: In/Out Cath Urine  Result Value Ref Range Status   Specimen Description   Final    IN/OUT CATH URINE Performed at [facility], [address] [place], [place] [address]    Special Requests   Final    NONE Performed at [facility], [address] [place], [place] [address]    Culture   Final    NO GROWTH Performed at [facility], [address] [place], [place] [address]    Report Status 06/21/2020 FINAL  Final  Blood Culture (routine x 2)     Status: None (Preliminary result)   Collection Time: 06/20/20  1:17 PM   Specimen: BLOOD  Result Value Ref Range Status   Specimen Description   Final    BLOOD BLOOD LEFT HAND Performed at [facility], [address] [place], [place] [address]    Special Requests   Final    BOTTLES DRAWN AEROBIC AND ANAEROBIC Blood Culture results may not be optimal due to an inadequate volume of blood received in culture bottles Performed at [facility], [address] [place], [place] [address]    Culture   Final    NO GROWTH 3 DAYS Performed at [facility], [address] [place], [place] [address]    Report Status PENDING  Incomplete  Respiratory Panel by RT PCR (Flu A&B, Covid) -     Status: None   Collection  Time: 06/20/20  1:18 PM   Specimen: Nasopharyngeal  Result Value Ref Range Status   SARS Coronavirus 2 by RT PCR NEGATIVE NEGATIVE Final    Comment: (NOTE) SARS-CoV-2 target nucleic acids are NOT DETECTED.  The SARS-CoV-2 RNA is generally detectable in upper respiratoy specimens during the acute phase of infection. The lowest concentration of SARS-CoV-2 viral copies this assay can detect is 131 copies/mL. A negative result does not preclude SARS-Cov-2 infection and should not be used as the sole basis for treatment or other patient management decisions. A negative result may occur with  improper specimen collection/handling, submission of specimen other than nasopharyngeal swab, presence of viral mutation(s) within the areas targeted by this assay, and inadequate number of viral copies (<131 copies/mL). A negative result must be combined with clinical observations, patient history, and epidemiological information. The expected result is Negative.  Fact Sheet for Patients:  https://www.moore.com/  Fact Sheet for Healthcare Providers:  https://www.young.biz/  This test is no t yet approved or cleared by the [place] FDA and  has been authorized for detection and/or diagnosis of SARS-CoV-2 by FDA under an Emergency Use Authorization (EUA). This EUA will remain  in effect (meaning this test can be used) for the duration of the COVID-19 declaration under Section 564(b)(1) of the Act, 21 U.S.C. section 360bbb-3(b)(1), unless the authorization is terminated or revoked sooner.     Influenza A by PCR NEGATIVE NEGATIVE Final   Influenza B by PCR NEGATIVE NEGATIVE Final    Comment: (NOTE) The Xpert Xpress SARS-CoV-2/FLU/RSV assay is intended as an aid in  the diagnosis of influenza from Nasopharyngeal  swab specimens and  should not be used as a sole basis for treatment. Nasal washings and  aspirates are unacceptable for Xpert Xpress SARS-CoV-2/FLU/RSV  testing.  Fact Sheet for Patients: https://www.moore.com/  Fact Sheet for Healthcare Providers: https://www.young.biz/  This test is not yet approved or cleared by the [place] FDA and  has been authorized for detection and/or diagnosis of SARS-CoV-2 by  FDA under an Emergency Use Authorization (EUA). This EUA will remain  in effect (meaning this test can be used) for the duration of the  Covid-19 declaration under Section 564(b)(1) of the Act, 21  U.S.C. section 360bbb-3(b)(1), unless the authorization is  terminated or revoked. Performed at [facility], [address] [place], [place] [address]   Blood Culture (routine x 2)     Status: None (Preliminary result)   Collection Time: 06/20/20  1:22 PM   Specimen: BLOOD  Result Value Ref Range Status   Specimen Description   Final    BLOOD RIGHT ANTECUBITAL Performed at [facility], [address] [place], [place] [address]    Special Requests   Final    BOTTLES DRAWN AEROBIC AND ANAEROBIC Blood Culture adequate volume Performed at [facility], [address] [place], [place] [address]    Culture   Final    NO GROWTH 3 DAYS Performed at [facility] [place] [facility], [address] [place], [place] [address]    Report Status PENDING  Incomplete  C Difficile Quick Screen w PCR reflex     Status: Abnormal   Collection Time: 06/20/20  1:31 PM   Specimen: Stool  Result Value Ref Range Status   C Diff antigen POSITIVE (A) NEGATIVE Final   C Diff toxin POSITIVE (A) NEGATIVE Final   C Diff interpretation Toxin producing C. difficile detected.  Final    Comment: CRITICAL  RESULT CALLED TO, READ BACK BY AND VERIFIED WITH: ZULETTA,K RN @1524  ON 06/20/20 JACKSON,K Performed at [facility], [address], [place], [place]      Studies:  DG HIP UNILAT WITH PELVIS 2-3 VIEWS LEFT  Result Date: 06/23/2020 CLINICAL DATA:  Left hip pain EXAM: DG HIP (WITH OR WITHOUT PELVIS) 2-3V LEFT COMPARISON:  05/11/2020 FINDINGS: Left hip position alignment. No change from the prior hemiarthroplasty in satisfactory study. No fracture or prosthetic loosening identified. IMPRESSION: Satisfactory left hip hemiarthroplasty without complication. Electronically Signed   By: 05/13/2020 M.D.   On: 06/23/2020 10:28       Rexford Prevo S Rhegan Trunnell   [facility] If 7PM-7AM, please contact night-coverage at [email or web address], Office  [phone]   06/23/2020, 5:10 PM  LOS: 2 days

## 2020-06-23 NOTE — Evaluation (Signed)
Physical Therapy Evaluation Patient Details Name: Kristin Rush MRN: [id number] DOB: 25-Apr-1941 Today's Date: 06/23/2020   History of Present Illness  79 y.o. female admitted with lethargy, incontinence, poor appetite. Dx of c diff, dehydration, metabolic encephalopathy. 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  Clinical Impression  Pt admitted with above diagnosis. Mod assist for bed mobility. +2 assist for stand pivot transfers, pt requires assist for balance and safety. She requires ongoing verbal and manual cues due to decreased safety awareness. Pt currently with functional limitations due to the deficits listed below (see PT Problem List). Pt will benefit from skilled PT to increase their independence and safety with mobility to allow discharge to the venue listed below.       Follow Up Recommendations Home health PT;Supervision for mobility/OOB    Equipment Recommendations  None recommended by PT    Recommendations for Other Services       Precautions / Restrictions Precautions Precautions: Fall;Posterior Hip Precaution Comments: L inattention secondary to recent CVA Restrictions Weight Bearing Restrictions: No Other Position/Activity Restrictions: WBAT Lt LE      Mobility  Bed Mobility Overal bed mobility: Needs Assistance Bed Mobility: Rolling;Sidelying to Sit Rolling: Mod assist Sidelying to sit: Mod assist       General bed mobility comments: verbal/manual cues to initiate movement and for technique; pt rolled L and R for pericare 2* incontinence of BM  Transfers Overall transfer level: Needs assistance Equipment used: Rolling walker (2 wheeled) Transfers: Sit to/from [employer] Sit to Stand: +2 physical assistance;+2 safety/equipment;Mod assist Stand pivot transfers: +2 physical assistance;+2 safety/equipment;Mod assist       General transfer comment: max  verbal and manual cues for technique , pt has very narrow base of support, assist for posterior lean; SPT x 2 bed to 3 in 1 then to recliner  Ambulation/Gait             General Gait Details: deferred 2* poor balance in standing  Stairs            Wheelchair Mobility    Modified Rankin (Stroke Patients Only)       Balance   Sitting-balance support: Feet supported;Bilateral upper extremity supported Sitting balance-Leahy Scale: Poor Sitting balance - Comments: posterior lean requiring min A, then progressed to supervision Postural control: Posterior lean Standing balance support: Bilateral upper extremity supported Standing balance-Leahy Scale: Poor Standing balance comment: BUE support needed on RW, posterior lean                             Pertinent Vitals/Pain Pain Assessment: No/denies pain    Home Living Family/patient expects to be discharged to:: Private residence Living Arrangements: Other relatives (sister "[name]") Available Help at Discharge: Available 24 hours/day;Family;Personal care attendant Type of Home: House Home Access: Ramped entrance     Home Layout: Two level;Able to live on main level with bedroom/bathroom;Full bath on main level Home Equipment: Walker - 2 wheels;Walker - 4 wheels;Cane - single point;Grab bars - toilet;Tub bench;Grab bars - tub/shower;Hand held shower head;Bedside commode;Wheelchair - manual;Walker - standard;Hospital bed Additional Comments: pt lives with her sister and they have hired a caregiver to be with her 7 days/week for ~8-10 hours per day (daytime hrs). Has 5 sisters to help her.    Prior Function Level of Independence: Needs assistance   Gait / Transfers Assistance Needed: pt ambulates with RW and assist from  1 person in home. Has been recieving HHPT due to recent CVA/hip hemi.  ADL's / Homemaking Assistance Needed: pt receives assist to dress (bra, socks shoes), sits on shower bench for showers with  assist of sisters to wash , uses BSC at bedside and toilet riser in bathroom with A, walks with RW and assistance household distances, uses WC when going out        Hand Dominance   Dominant Hand: Right    Extremity/Trunk Assessment   Upper Extremity Assessment Upper Extremity Assessment: Defer to OT evaluation    Lower Extremity Assessment Lower Extremity Assessment: Generalized weakness;Difficult to assess due to impaired cognition    Cervical / Trunk Assessment Cervical / Trunk Assessment: Kyphotic  Communication   Communication: No difficulties;HOH  Cognition Arousal/Alertness: Awake/alert Behavior During Therapy: Flat affect Overall Cognitive Status: History of cognitive impairments - at baseline Area of Impairment: Following commands;Problem solving;Memory;Safety/judgement                 Orientation Level: Place;Time;Situation   Memory: Decreased short-term memory;Decreased recall of precautions Following Commands: Follows one step commands inconsistently Safety/Judgement: Decreased awareness of safety;Decreased awareness of deficits   Problem Solving: Slow processing;Decreased initiation;Difficulty sequencing;Requires verbal cues General Comments: patient requires increased processing time, increased difficulty with transitional movements      General Comments      Exercises     Assessment/Plan    PT Assessment Patient needs continued PT services  PT Problem List Decreased strength;Decreased mobility;Decreased safety awareness;Decreased range of motion;Decreased activity tolerance;Decreased balance;Decreased knowledge of use of DME;Decreased cognition       PT Treatment Interventions Gait training;Functional mobility training;Therapeutic activities;Therapeutic exercise;Patient/family education;Balance training    PT Goals (Current goals can be found in the Care Plan section)  Acute Rehab PT Goals Patient Stated Goal: return to walking PT Goal  Formulation: With family Time For Goal Achievement: 07/07/20 Potential to Achieve Goals: Fair    Frequency Min 3X/week   Barriers to discharge        Co-evaluation     PT goals addressed during session: Mobility/safety with mobility OT goals addressed during session: ADL's and self-care       AM-PAC PT "6 Clicks" Mobility  Outcome Measure Help needed turning from your back to your side while in a flat bed without using bedrails?: A Lot Help needed moving from lying on your back to sitting on the side of a flat bed without using bedrails?: A Lot Help needed moving to and from a bed to a chair (including a wheelchair)?: A Lot Help needed standing up from a chair using your arms (e.g., wheelchair or bedside chair)?: A Lot Help needed to walk in hospital room?: A Lot Help needed climbing 3-5 steps with a railing? : A Lot 6 Click Score: 12    End of Session Equipment Utilized During Treatment: Gait belt Activity Tolerance: Patient tolerated treatment well Patient left: in chair;with call bell/phone within reach;with family/visitor present Nurse Communication: Mobility status PT Visit Diagnosis: Repeated falls (R29.6);Muscle weakness (generalized) (M62.81);Difficulty in walking, not elsewhere classified (R26.2);Unsteadiness on feet (R26.81);Other abnormalities of gait and mobility (R26.89)    Time: 0354-6568 PT Time Calculation (min) (ACUTE ONLY): 51 min   Charges:   PT Evaluation $PT Eval Moderate Complexity: 1 Mod PT Treatments $Therapeutic Activity: 8-22 mins      Ralene Bathe Kistler PT 06/23/2020  Acute Rehabilitation Services Pager [phone] Office [phone]

## 2020-06-24 LAB — BASIC METABOLIC PANEL
Anion gap: 6 (ref 5–15)
BUN: 7 mg/dL — ABNORMAL LOW (ref 8–23)
CO2: 27 mmol/L (ref 22–32)
Calcium: 8.1 mg/dL — ABNORMAL LOW (ref 8.9–10.3)
Chloride: 107 mmol/L (ref 98–111)
Creatinine, Ser: 0.66 mg/dL (ref 0.44–1.00)
GFR, Estimated: >60 mL/min (ref >60)
Glucose, Bld: 101 mg/dL — ABNORMAL HIGH (ref 70–99)
Potassium: 3.7 mmol/L (ref 3.5–5.1)
Sodium: 140 mmol/L (ref 135–145)

## 2020-06-24 NOTE — Progress Notes (Signed)
Physical Therapy Treatment Patient Details Name: Kristin Rush MRN: [id number] DOB: 1940-11-06 Today's Date: 06/24/2020    History of Present Illness 79 y.o. female admitted with lethargy, incontinence, poor appetite. Dx of c diff, dehydration, metabolic encephalopathy. 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    PT Comments    General bed mobility comments: increased time and use of bed pad to complete scooting to EOB General transfer comment: assisted from elevated bed to [facility] pt incont loose stools then from [facility] to recliner + 2 assist.  Positioned in recliner to comfort.   Follow Up Recommendations  Home health PT;Supervision for mobility/OOB     Equipment Recommendations  None recommended by PT    Recommendations for Other Services       Precautions / Restrictions Precautions Precautions: Fall;Posterior Hip Precaution Comments: L inattention secondary to recent CVA then had a Posterior THR and now this admission Restrictions Weight Bearing Restrictions: No Other Position/Activity Restrictions: WBAT Lt LE    Mobility  Bed Mobility Overal bed mobility: Needs Assistance Bed Mobility: Supine to Sit     Supine to sit: Mod assist     General bed mobility comments: increased time and use of bed pad to complete scooting to EOB  Transfers Overall transfer level: Needs assistance Equipment used: Rolling walker (2 wheeled) Transfers: Sit to/from [employer] Sit to Stand: +2 physical assistance;+2 safety/equipment;Mod assist Stand pivot transfers: +2 physical assistance;+2 safety/equipment;Mod assist       General transfer comment: assisted from elevated bed to [facility] pt incont loose stools then from [facility] to recliner + 2 assist  Ambulation/Gait             General Gait Details: deferred 2* poor balance in standing   Stairs             Wheelchair Mobility     Modified Rankin (Stroke Patients Only)       Balance                                            Cognition Arousal/Alertness: Awake/alert Behavior During Therapy: Flat affect Overall Cognitive Status: History of cognitive impairments - at baseline Area of Impairment: Following commands;Problem solving;Memory;Safety/judgement                               General Comments: AxO x 1 following repeat commands      Exercises      General Comments        Pertinent Vitals/Pain Pain Assessment: No/denies pain    Home Living                      Prior Function            PT Goals (current goals can now be found in the care plan section) Progress towards PT goals: Progressing toward goals    Frequency    Min 3X/week      PT Plan Current plan remains appropriate    Co-evaluation              AM-PAC PT "6 Clicks" Mobility   Outcome Measure  Help needed turning from your back to your side while in a flat bed without using bedrails?: A Lot Help needed moving from lying on  your back to sitting on the side of a flat bed without using bedrails?: A Lot Help needed moving to and from a bed to a chair (including a wheelchair)?: A Lot Help needed standing up from a chair using your arms (e.g., wheelchair or bedside chair)?: A Lot Help needed to walk in hospital room?: Total Help needed climbing 3-5 steps with a railing? : Total 6 Click Score: 10    End of Session Equipment Utilized During Treatment: Gait belt Activity Tolerance: Patient tolerated treatment well Patient left: in chair;with call bell/phone within reach;with family/visitor present Nurse Communication: Mobility status PT Visit Diagnosis: Repeated falls (R29.6);Muscle weakness (generalized) (M62.81);Difficulty in walking, not elsewhere classified (R26.2);Unsteadiness on feet (R26.81);Other abnormalities of gait and mobility (R26.89)     Time: 8502-7741 PT  Time Calculation (min) (ACUTE ONLY): 27 min  Charges:  $Gait Training: 8-22 mins $Therapeutic Activity: 8-22 mins                     Felecia Shelling  PTA Acute  Rehabilitation Services Pager      [phone] Office      [phone]

## 2020-06-24 NOTE — Care Management Important Message (Signed)
Important Message  Patient Details IM Letter given to the Patient Name: Kristin Rush MRN: [id number] Date of Birth: 1941/03/07   Medicare Important Message Given:  Yes     [name] 06/24/2020, 10:56 AM

## 2020-06-24 NOTE — Progress Notes (Addendum)
[facility]  PROGRESS NOTE  Kristin Rush OIZ:[id number] DOB: 1941-06-01 DOA: 06/20/2020 PCP: Eartha Inch, MD   Brief HPI:   79 year old female with history of dementia, NPH came to ED with complaints of lethargy and hypersomnolence.  Patient was found to be positive for C. Difficile,  started on vancomycin.   Subjective   Patient seen and examined, had 2 loose BM this morning.  Denies any abdominal pain.   Assessment/Plan:     1. Acute metabolic encephalopathy-patient has history of dementia at baseline.  Also has history of NPH, followed by neurosurgery at [facility].  Neurosurgery was consulted who reviewed CT scan of the head on admission and recommended no acute intervention at this time.  Patient is back to baseline. 2. C. difficile colitis-patient started on oral vancomycin, diarrhea slowly improving.  Continue contact isolation.  IV LR at 50 mill per hour. 3. History of CVA/hyperlipidemia-continue Lipitor, aspirin 4. GPC bacteremia-1 out of 4 bottles of blood culture growing gram-positive cocci in chains.  Final culture results pending.  Patient is afebrile.  No signs and symptoms of infectious process.  Will await final culture results.  Will avoid giving antibiotics as patient currently has ongoing diarrhea from C. difficile. 5. History of dysphagia-seen by speech therapy. Continue regular/thin diet 6. Unstable gait/NP history-followed by [facility] and is supposed to have spinal tap done on outpatient basis.  Follow-up neurosurgery at discharge. 7. Chronic orthostatic hypotension-followed by cardiology, has been on and off midodrine and BP meds. 8. Thrombocytopenia-hold Lovenox as patient is borderline thrombocytopenia.  Follow platelet count in a.m.     COVID-19 Labs  No results for input(s): DDIMER, FERRITIN, LDH, CRP in the last 72 hours.  Lab Results  Component Value Date   SARSCOV2NAA NEGATIVE 06/20/2020   SARSCOV2NAA NEGATIVE 05/11/2020    SARSCOV2NAA NEGATIVE 04/28/2020   SARSCOV2NAA NEGATIVE 03/22/2020     Scheduled medications:   . aspirin EC  81 mg Oral Daily  . atorvastatin  80 mg Oral q1800  . escitalopram  10 mg Oral Daily  . feeding supplement  1 Container Oral Q24H  . feeding supplement  237 mL Oral Q24H  . ferrous sulfate  325 mg Oral Q breakfast  . multivitamin with minerals  1 tablet Oral Daily  . potassium chloride  40 mEq Oral Daily  . vancomycin  125 mg Oral QID         CBG: No results for input(s): GLUCAP in the last 168 hours.  SpO2: 98 %    CBC: Recent Labs  Lab 06/20/20 1317 06/21/20 0557 06/22/20 0317  WBC 7.8 6.5 5.1  NEUTROABS 6.3  --   --   HGB 11.4* 10.8* 9.7*  HCT 40.2 33.1* 30.9*  MCV 109.2* 95.9 99.0  PLT 101* 108* 104*    Basic Metabolic Panel: Recent Labs  Lab 06/20/20 1317 06/20/20 1919 06/21/20 0557 06/22/20 0317 06/24/20 0328  NA 133*  --  134* 137 140  K 3.4*  --  3.4* 3.5 3.7  CL 101  --  101 105 107  CO2 22  --  24 25 27   GLUCOSE 98  --  92 96 101*  BUN 14  --  12 12 7*  CREATININE 0.73  --  0.67 0.74 0.66  CALCIUM 7.9*  --  8.2* 8.0* 8.1*  MG  --  1.8  --   --   --      Liver Function Tests: Recent Labs  Lab 06/20/20 1317 06/21/20  0557  AST 17 17  ALT 9 10  ALKPHOS 87 78  BILITOT 0.8 0.9  PROT 5.9* 5.6*  ALBUMIN 3.3* 3.0*     Antibiotics: Anti-infectives (From admission, onward)   Start     Dose/Rate Route Frequency Ordered Stop   06/20/20 1800  vancomycin (VANCOCIN) 50 mg/mL oral solution 125 mg        125 mg Oral 4 times daily 06/20/20 1600 06/30/20 1759       DVT prophylaxis: SCDs  Code Status: DNR  Family Communication: Discussed with patient's sister at bedside    Status is: Inpatient  Dispo: The patient is from: Home              Anticipated d/c is to: Home              Anticipated d/c date is: 06/25/2020              Patient currently not medically stable for discharge  Barrier to discharge-ongoing diarrhea due  to C. difficile colitis      Consultants:    Procedures:     Objective   Vitals:   06/23/20 1337 06/23/20 2051 06/23/20 2142 06/24/20 0602  BP: (!) 156/83 (!) 178/80 (!) 170/68 (!) 179/63  Pulse: 78 77 72 68  Resp: 16 16  14   Temp: (!) 97.5 F (36.4 C) 97.6 F (36.4 C)  97.6 F (36.4 C)  TempSrc: Oral Oral  Axillary  SpO2: 99% 99% 99% 98%  Weight:      Height:        Intake/Output Summary (Last 24 hours) at 06/24/2020 1347 Last data filed at 06/24/2020 1000 Gross per 24 hour  Intake 1076.43 ml  Output 950 ml  Net 126.43 ml    10/12 1901 - 10/14 0700 In: 772.5 [P.O.:430; I.V.:342.5] Out: 1800 [Urine:1800]  Filed Weights   06/20/20 1313  Weight: 59 kg    Physical Examination:   General-appears in no acute distress  Heart-S1-S2, regular, no murmur auscultated  Lungs-clear to auscultation bilaterally, no wheezing or crackles auscultated  Abdomen-soft, nontender, no organomegaly  Extremities-no edema in the lower extremities  Neuro-alert, oriented x3, no focal deficit noted    Data Reviewed:   Recent Results (from the past 240 hour(s))  Urine culture     Status: None   Collection Time: 06/20/20  1:17 PM   Specimen: In/Out Cath Urine  Result Value Ref Range Status   Specimen Description   Final    IN/OUT CATH URINE Performed at [facility], [address] [place], [place]    Special Requests   Final    NONE Performed at [facility], [address], [place], [place]    Culture   Final    NO GROWTH Performed at [facility], [address], [place], [place]    Report Status 06/21/2020 FINAL  Final  Blood Culture (routine x 2)     Status: None (Preliminary result)   Collection Time: 06/20/20  1:17 PM   Specimen: BLOOD  Result Value Ref Range Status   Specimen Description   Final    BLOOD BLOOD LEFT HAND Performed at [facility], [address] [place], [place]    Special Requests   Final    BOTTLES DRAWN AEROBIC AND ANAEROBIC Blood Culture results may not be optimal due to an inadequate volume of blood received in culture bottles Performed at [facility], [address] [place], [place] [address]    Culture   Final    NO GROWTH 4 DAYS Performed at [facility], [address] [place], [place] [address]    Report Status PENDING  Incomplete  Respiratory Panel by RT PCR (Flu A&B, Covid) -     Status: None   Collection Time: 06/20/20  1:18 PM   Specimen: Nasopharyngeal  Result Value Ref Range Status   SARS Coronavirus 2 by RT PCR NEGATIVE NEGATIVE Final    Comment: (NOTE) SARS-CoV-2 target nucleic acids are NOT DETECTED.  The SARS-CoV-2 RNA is generally detectable in upper respiratoy specimens during the acute phase of infection. The lowest concentration of SARS-CoV-2 viral copies this assay can detect is 131 copies/mL. A negative result does not preclude SARS-Cov-2 infection and should not be used as the sole basis for treatment or other patient management decisions. A negative result may occur with  improper specimen collection/handling, submission of specimen other than nasopharyngeal swab, presence of viral mutation(s) within the areas targeted by this assay, and inadequate number of viral copies (<131 copies/mL). A negative result must be combined with clinical observations, patient history, and epidemiological information. The expected result is Negative.  Fact Sheet for Patients:  https://www.moore.com/https://www.fda.gov/media/142436/download  Fact Sheet for Healthcare Providers:  https://www.young.biz/https://www.fda.gov/media/142435/download  This test is no t yet approved or cleared by the [place] FDA and  has been authorized for detection and/or diagnosis of SARS-CoV-2 by FDA under an Emergency Use Authorization (EUA). This EUA will remain  in effect (meaning this test can be used) for the duration  of the COVID-19 declaration under Section 564(b)(1) of the Act, 21 U.S.C. section 360bbb-3(b)(1), unless the authorization is terminated or revoked sooner.     Influenza A by PCR NEGATIVE NEGATIVE Final   Influenza B by PCR NEGATIVE NEGATIVE Final    Comment: (NOTE) The Xpert Xpress SARS-CoV-2/FLU/RSV assay is intended as an aid in  the diagnosis of influenza from Nasopharyngeal swab specimens and  should not be used as a sole basis for treatment. Nasal washings and  aspirates are unacceptable for Xpert Xpress SARS-CoV-2/FLU/RSV  testing.  Fact Sheet for Patients: https://www.moore.com/https://www.fda.gov/media/142436/download  Fact Sheet for Healthcare Providers: https://www.young.biz/https://www.fda.gov/media/142435/download  This test is not yet approved or cleared by the [place] FDA and  has been authorized for detection and/or diagnosis of SARS-CoV-2 by  FDA under an Emergency Use Authorization (EUA). This EUA will remain  in effect (meaning this test can be used) for the duration of the  Covid-19 declaration under Section 564(b)(1) of the Act, 21  U.S.C. section 360bbb-3(b)(1), unless the authorization is  terminated or revoked. Performed at [facility], [address] [place], [place] [address]   Blood Culture (routine x 2)     Status: None (Preliminary result)   Collection Time: 06/20/20  1:22 PM   Specimen: BLOOD  Result Value Ref Range Status   Specimen Description   Final    BLOOD RIGHT ANTECUBITAL Performed at [facility], [address] [place], [place] [address]    Special Requests   Final    BOTTLES DRAWN AEROBIC AND ANAEROBIC Blood Culture adequate volume Performed at [facility], [address] [place], [place] [address]    Culture   Final    GRAM POSITIVE COCCI IN CHAINS CRITICAL RESULT CALLED TO, READ BACK BY AND VERIFIED WITH: JTeodoro Kil. GADHIA PHARMD, AT 1251 06/24/20 BY D. VANHOOK IDENTIFICATION TO FOLLOW Performed  at [facility], [address] [place], [place] [address]    Report Status PENDING  Incomplete  C Difficile Quick Screen w PCR reflex     Status: Abnormal   Collection Time: 06/20/20  1:31 PM   Specimen: Stool  Result Value Ref Range Status   C Diff antigen POSITIVE (A) NEGATIVE Final   C Diff toxin POSITIVE (A) NEGATIVE Final   C Diff interpretation Toxin producing C. difficile detected.  Final    Comment: CRITICAL RESULT CALLED TO, READ BACK BY AND VERIFIED WITH: ZULETTA,K RN @1524  ON 06/20/20 JACKSON,K Performed at [facility], [address], [place], [place]      Studies:  DG HIP UNILAT WITH PELVIS 2-3 VIEWS LEFT  Result Date: 06/23/2020 CLINICAL DATA:  Left hip pain EXAM: DG HIP (WITH OR WITHOUT PELVIS) 2-3V LEFT COMPARISON:  05/11/2020 FINDINGS: Left hip position alignment. No change from the prior hemiarthroplasty in satisfactory study. No fracture or prosthetic loosening identified. IMPRESSION: Satisfactory left hip hemiarthroplasty without complication. Electronically Signed   By: 05/13/2020 M.D.   On: 06/23/2020 10:28       Dianca Owensby S Alaria Oconnor   [facility] If 7PM-7AM, please contact night-coverage at [email or web address], Office  [phone]   06/24/2020, 1:47 PM  LOS: 3 days

## 2020-06-24 NOTE — Progress Notes (Addendum)
PHARMACY - PHYSICIAN COMMUNICATION CRITICAL VALUE ALERT - BLOOD CULTURE IDENTIFICATION (BCID)  Tamanna Whitson is an 79 y.o. female who presented to [facility] on 06/20/2020 with a chief complaint of lethargy, somnolence. Positive for Cdiff, started on PO vanc.  Assessment:  1/4 BCx bottles growing GPCs in chains (unable to do BCID as GPCs initially missed on read of Gram stain and now beyond window of viability for BCID). More likely strep; less likely enterococcus spp. Generally strep and enterococcus in blood require treatment, although strep viridans can be contaminant.  Name of physician (or Provider) Contacted: Lama  Current antibiotics: none (PO vanc for Cdiff only)  Changes to prescribed antibiotics recommended: Given only 1/4 bottles positive, late growth (D4/5 culture incubation), lack of fevers or leukocytosis, and alternative explanation for presenting symptoms (Cdiff), treating as contaminant seems reasonable at this time. Should clinical status worsen, Rocephin 2g IV q24 would provide empiric coverage of strep (though not enterococcus). Recommendations accepted by provider  No results found for this or any previous visit.  Barnett Elzey A 06/24/2020  2:21 PM

## 2020-06-25 ENCOUNTER — Other Ambulatory Visit (HOSPITAL_COMMUNITY): Payer: Self-pay | Admitting: Family Medicine

## 2020-06-25 LAB — CULTURE, BLOOD (ROUTINE X 2)
Culture: NO GROWTH
Special Requests: ADEQUATE

## 2020-06-25 LAB — BASIC METABOLIC PANEL
Anion gap: 6 (ref 5–15)
BUN: <5 mg/dL — ABNORMAL LOW (ref 8–23)
CO2: 26 mmol/L (ref 22–32)
Calcium: 8.1 mg/dL — ABNORMAL LOW (ref 8.9–10.3)
Chloride: 107 mmol/L (ref 98–111)
Creatinine, Ser: 0.64 mg/dL (ref 0.44–1.00)
GFR, Estimated: >60 mL/min (ref >60)
Glucose, Bld: 103 mg/dL — ABNORMAL HIGH (ref 70–99)
Potassium: 3.8 mmol/L (ref 3.5–5.1)
Sodium: 139 mmol/L (ref 135–145)

## 2020-06-25 MED ORDER — VANCOMYCIN HCL 125 MG PO CAPS: 125.0000 mg | ORAL_CAPSULE | Freq: Four times a day (QID) | ORAL | 0 refills | Status: DC

## 2020-06-25 MED FILL — VANCOMYCIN HCL 125 MG CAPS: 125 | 5 days supply | Qty: 20 | Fill #0

## 2020-06-25 NOTE — Progress Notes (Signed)
  Speech Language Pathology Treatment: Dysphagia  Patient Details Name: Kristin Rush MRN: [id number] DOB: 09-15-1940 Today's Date: 06/25/2020 Time: 8657-8469 SLP Time Calculation (min) (ACUTE ONLY): 17 min  Assessment / Plan / Recommendation Clinical Impression  Skilled observation complete with self feeding of prescribed po diet. Patient with (+) immediate cough and/or throat clear in response to thin liquids in 75% of trials today, decreasing to 50% with use of small cup sip rather than straw to decrease bolus size. No overt s/s of aspiration observed with self feeding of dysphagia 3 solids with timely oral transit of bolus. Per sister, today's degree of coughing is abnormal for patient with typically minimal coughing during po intake at home prior to admission. This is consistent with what was seen by SLP during bedside evaluation 10/12. Note that per sister, patient was to have a repeat MBS as recommended by [facility] SLP on 10/19 which was cancelled when patient was hospitalized. Although I do not see a need to resume thickened liquids at this time as patient has likely been compensating/tolerating a small amount of aspiration on thin liquids for some time, I do agree with repeat instrumental testing to determine if compensatory strategies may assist in decreasing episodic aspiration. Per RN, patient may d/c today pending infectious disease recommendations. If so, MBS can be scheduled as OP. Will order. If patient remains inpatient, can attempt to complete MBS 10/16. Sister made aware and in agreement. Educated patient and sister regarding compensatory strategies including fully clearing oral cavity of solids prior to consuming thin liquids as well as utilizing small cup sips rather than a straw if excessive coughing noted.    HPI HPI: pt is a 79 yo female adm to [facility] with decreased po intake, fevers, cough - COVID negative.  Pt found to have Cdiff.   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  Pt working with [facility] SLP [place] per sister Roxy.    Pt underwent an MBS when she was at [facility] and was placed on puree/honey thick liquids.  She had aspiration with nectar with cough response tht was effective to clear.  She has not been consuning thickened drinks for the last two weeks but does occasionally cough with liquids per her sister Roxy *who helps to care for her.  Swallow eval ordered.      SLP Plan  MBS       Recommendations  Diet recommendations: Dysphagia 3 (mechanical soft);Thin liquid Liquids provided via: Cup;Straw Supervision: Full supervision/cueing for compensatory strategies;Patient able to self feed Compensations: Slow rate;Small sips/bites Postural Changes and/or Swallow Maneuvers: Seated upright 90 degrees                Oral Care Recommendations: Oral care BID Follow up Recommendations: Outpatient SLP SLP Visit Diagnosis: Dysphagia, oropharyngeal phase (R13.12) Plan: MBS       GO             Charlayne Vultaggio MA, CCC-SLP     Fannye Myer Meryl 06/25/2020, 11:04 AM

## 2020-06-25 NOTE — Progress Notes (Signed)
Patient well-known to me, had left hip fracture and left hip hemiarthroplasty performed May 11, 2020.  I was called by the family, notifying me that she was rehospitalized and would miss her clinic visit.  I am effectively performing her clinic visit while she is here in the hospital.  Subjectively she seems to be doing reasonably well, minimal pain, just slightly confused.  On examination EHL is intact, her surgical wounds of healed well.  She is laying in bed.  X-rays demonstrate appropriate left hip hemiarthroplasty  Impression left femoral neck fracture status post hemiarthroplasty readmitted to the hospital with sepsis bacteremia, C. difficile colitis  Plan doing overall pretty well, from the orthopedic standpoint at least.  Hopefully she will resolve her C. difficile colitis and be able to regain more function.  Okay to be weightbearing as tolerated and work with physical therapy as possible.  She can follow-up with me in 4 to 6 weeks if necessary.  Anticoagulation no longer indicated from an orthopedic standpoint.  Teryl Lucy, MD.

## 2020-06-25 NOTE — Discharge Summary (Signed)
Physician Discharge Summary  Miguel RotaSue Hudson Aydt UEA:[id number] DOB: December 23, 1940 DOA: 06/20/2020  PCP: Eartha InchBadger, Michael C, MD  Admit date: 06/20/2020 Discharge date: 06/25/2020  Time spent: 50 minutes  Recommendations for Outpatient Follow-up:  1. Follow-up PCP in 2 weeks 2. Will have outpatient modified barium swallow.  It has been scheduled.   Discharge Diagnoses:  Active Problems:   Clostridium difficile diarrhea   C. difficile diarrhea   Discharge Condition: Stable  Diet recommendation: Soft diet  Filed Weights   06/20/20 1313  Weight: 59 kg    History of present illness:  79 year old female with history of dementia, NPH came to ED with complaints of lethargy and hypersomnolence.  Patient was found to be positive for C. Difficile,  started on vancomycin.  Hospital Course:   1. *Acute metabolic encephalopathy-patient has history of dementia at baseline.  Also has history of NPH, [facility]  Neurosurgery was consulted who reviewed CT scan of the head on admission and recommended no acute intervention at this time.  Patient is back to baseline. 2. C. difficile colitis-patient started on oral vancomycin, diarrhea has significantly improved.  Will discharge on vancomycin 125 mg p.o. twice daily for 5 more days to complete 10 days treatment. 3. History of CVA/hyperlipidemia-continue Lipitor, aspirin 4. GPC bacteremia-1 out of 4 bottles of blood culture growing gram-positive cocci in chains.  Final culture results pending.  Patient is afebrile.  No signs and symptoms of infectious process.    Final culture results showed Kocuria sp and staph epidermidis.  Called and discussed Dr. Hermina StaggersKeith Vandam, these are likely contaminants as it only was in 1 out of 4 bottles.  No further antibiotics will be given.   5. History of dysphagia-seen by speech therapy. Continue soft diet.  Patient will have outpatient MBS scheduled. 6. Unstable gait/NP history-followed by [facility] and is supposed to have spinal tap done on outpatient basis.  Follow-up neurosurgery at discharge. 7. Chronic orthostatic hypotension-followed by cardiology, has been on and off midodrine and BP meds. 8. Thrombocytopenia-platelet count is stable at 104000.  Procedures:    Consultations:    Discharge Exam: Vitals:   06/24/20 2100 06/25/20 0549  BP: (!) 183/90 (!) 171/96  Pulse: 83 90  Resp: 16 15  Temp: 98.2 F (36.8 C) 98.2 F (36.8 C)  SpO2: 97% 98%    General: Appears in no acute distress Cardiovascular: S1-S2, regular Respiratory: Clear to auscultation bilaterally  Discharge Instructions   Discharge Instructions    Diet - low sodium heart healthy   Complete by: As directed    Increase activity slowly   Complete by: As directed    No wound care   Complete by: As directed      Allergies as of 06/25/2020      Reactions   Codeine Nausea And Vomiting   Sulfa Antibiotics Other (See Comments)   unk      Medication List    STOP taking these medications   cefdinir 300 MG capsule Commonly known as: OMNICEF   clopidogrel 75 MG tablet Commonly known as: PLAVIX   enoxaparin 40 MG/0.4ML injection Commonly known as: Lovenox   senna-docusate 8.6-50 MG tablet Commonly known as: Senokot-S     TAKE these medications   acetaminophen 500 MG tablet Commonly known as: TYLENOL Take 500 mg by mouth daily as needed for mild pain.   ALPRAZolam 0.25 MG tablet Commonly known as: XANAX Take 1 tablet (0.25 mg total) by mouth 2 (two) times  daily as needed for anxiety.   aspirin 81 MG EC tablet Take 1 tablet (81 mg total) by mouth daily. Swallow whole.   atorvastatin 80 MG tablet Commonly known as: LIPITOR Take 1 tablet (80 mg total) by mouth daily at 6 PM.   escitalopram 10 MG tablet Commonly known as: LEXAPRO Take 10 mg by mouth daily.   feeding supplement Liqd Take 237 mLs by mouth 2 (two) times daily between meals.   ferrous sulfate 325 (65 FE) MG  tablet Take 1 tablet (325 mg total) by mouth daily with breakfast.   nystatin 100000 UNIT/ML suspension Commonly known as: MYCOSTATIN Take 5 mLs (500,000 Units total) by mouth 4 (four) times daily.   Resource ThickenUp Clear Powd To make liquids honey thick   vancomycin 125 MG capsule Commonly known as: VANCOCIN Take 1 capsule (125 mg total) by mouth 4 (four) times daily for 5 days.      Allergies  Allergen Reactions  . Codeine Nausea And Vomiting  . Sulfa Antibiotics Other (See Comments)    unk      The results of significant diagnostics from this hospitalization (including imaging, microbiology, ancillary and laboratory) are listed below for reference.    Significant Diagnostic Studies: CT Head Wo Contrast  Result Date: 06/20/2020 CLINICAL DATA:  Mental status change. EXAM: CT HEAD WITHOUT CONTRAST TECHNIQUE: Contiguous axial images were obtained from the base of the skull through the vertex without intravenous contrast. COMPARISON:  May 10, 2020 FINDINGS: Brain: No evidence of acute infarction, hemorrhage, hydrocephalus, extra-axial collection or mass lesion/mass effect. There is chronic diffuse atrophy. Chronic bilateral periventricular white matter small vessel ischemic changes are noted. Old infarcts are identified in the right basal ganglia and right thalamus. Vascular: No hyperdense vessel is noted. Skull: Normal. Negative for fracture or focal lesion. Sinuses/Orbits: No acute finding. Other: None IMPRESSION: 1. No focal acute intracranial abnormality identified. 2. Chronic diffuse atrophy. Chronic bilateral periventricular white matter small vessel ischemic change. Old infarcts in the right basal ganglia and right thalamus. Electronically Signed   By: Sherian Rein M.D.   On: 06/20/2020 16:07   DG Chest Port 1 View  Result Date: 06/20/2020 CLINICAL DATA:  Sepsis EXAM: PORTABLE CHEST 1 VIEW COMPARISON:  May 10, 2020 FINDINGS: The cardiomediastinal silhouette is  unchanged in contour.Atherosclerotic calcifications of the aorta. No pleural effusion. No pneumothorax. No acute pleuroparenchymal abnormality. Visualized abdomen is unremarkable. Multilevel degenerative changes of the thoracic spine. IMPRESSION: No acute cardiopulmonary abnormality. Electronically Signed   By: Meda Klinefelter MD   On: 06/20/2020 14:14   DG HIP UNILAT WITH PELVIS 2-3 VIEWS LEFT  Result Date: 06/23/2020 CLINICAL DATA:  Left hip pain EXAM: DG HIP (WITH OR WITHOUT PELVIS) 2-3V LEFT COMPARISON:  05/11/2020 FINDINGS: Left hip position alignment. No change from the prior hemiarthroplasty in satisfactory study. No fracture or prosthetic loosening identified. IMPRESSION: Satisfactory left hip hemiarthroplasty without complication. Electronically Signed   By: Marlan Palau M.D.   On: 06/23/2020 10:28    Microbiology: Recent Results (from the past 240 hour(s))  Urine culture     Status: None   Collection Time: 06/20/20  1:17 PM   Specimen: In/Out Cath Urine  Result Value Ref Range Status   Specimen Description   Final    IN/OUT CATH URINE Performed at [facility], [address] [place], [place] [address]    Special Requests   Final    NONE Performed at [facility], [address]  [place], [place] [address]    Culture   Final    NO GROWTH Performed at [facility], [address] [place], [place] [address]    Report Status 06/21/2020 FINAL  Final  Blood Culture (routine x 2)     Status: None   Collection Time: 06/20/20  1:17 PM   Specimen: BLOOD  Result Value Ref Range Status   Specimen Description   Final    BLOOD BLOOD LEFT HAND Performed at [facility], [address] [place], [place] [address]    Special Requests   Final    BOTTLES DRAWN AEROBIC AND ANAEROBIC Blood Culture results may not be optimal due to an inadequate volume of blood received in culture bottles Performed at [facility], [address] [place], [place] [address]    Culture   Final    NO GROWTH 5 DAYS Performed at [facility], [address], [place], [place] [address]    Report Status 06/25/2020 FINAL  Final  Respiratory Panel by RT PCR (Flu A&B, Covid) -     Status: None   Collection Time: 06/20/20  1:18 PM   Specimen: Nasopharyngeal  Result Value Ref Range Status   SARS Coronavirus 2 by RT PCR NEGATIVE NEGATIVE Final    Comment: (NOTE) SARS-CoV-2 target nucleic acids are NOT DETECTED.  The SARS-CoV-2 RNA is generally detectable in upper respiratoy specimens during the acute phase of infection. The lowest concentration of SARS-CoV-2 viral copies this assay can detect is 131 copies/mL. A negative result does not preclude SARS-Cov-2 infection and should not be used as the sole basis for treatment or other patient management decisions. A negative result may occur with  improper specimen collection/handling, submission of specimen other than nasopharyngeal swab, presence of viral mutation(s) within the areas targeted by this assay, and inadequate number of viral copies (<131 copies/mL). A negative result must be combined with clinical observations, patient history, and epidemiological information. The expected result is Negative.  Fact Sheet for Patients:  https://www.moore.com/  Fact Sheet for Healthcare Providers:  https://www.young.biz/  This test is no t yet approved or cleared by the [place] FDA and  has been authorized for detection and/or diagnosis of SARS-CoV-2 by FDA under an Emergency Use Authorization (EUA). This EUA will remain  in effect (meaning this test can be used) for the duration of the COVID-19 declaration under Section 564(b)(1) of the Act, 21 U.S.C. section 360bbb-3(b)(1), unless the authorization is terminated or revoked sooner.     Influenza A by PCR NEGATIVE NEGATIVE Final   Influenza B by PCR  NEGATIVE NEGATIVE Final    Comment: (NOTE) The Xpert Xpress SARS-CoV-2/FLU/RSV assay is intended as an aid in  the diagnosis of influenza from Nasopharyngeal swab specimens and  should not be used as a sole basis for treatment. Nasal washings and  aspirates are unacceptable for Xpert Xpress SARS-CoV-2/FLU/RSV  testing.  Fact Sheet for Patients: https://www.moore.com/  Fact Sheet for Healthcare Providers: https://www.young.biz/  This test is not yet approved or cleared by the [place] FDA and  has been authorized for detection and/or diagnosis of SARS-CoV-2 by  FDA under an Emergency Use Authorization (EUA). This EUA will remain  in effect (meaning this test can be used) for the duration of the  Covid-19 declaration under Section 564(b)(1) of the Act, 21  U.S.C. section 360bbb-3(b)(1), unless the authorization is  terminated or revoked. Performed at [facility], [address]  [place], [place] [address]   Blood Culture (routine x 2)     Status: Abnormal   Collection Time: 06/20/20  1:22 PM   Specimen: BLOOD  Result Value Ref Range Status   Specimen Description   Final    BLOOD RIGHT ANTECUBITAL Performed at [facility], [address], [place], [place] [address]    Special Requests   Final    BOTTLES DRAWN AEROBIC AND ANAEROBIC Blood Culture adequate volume Performed at [facility], [address] [place], [place] [address]    Culture (A)  Final    Elly Modena SPECIES CRITICAL RESULT CALLED TO, READ BACK BY AND VERIFIED WITH: Earlean Shawl PHARMD, AT 1251 06/24/20 BY D. VANHOOK Standardized susceptibility testing for this organism is not available. STAPHYLOCOCCUS EPIDERMIDIS THE SIGNIFICANCE OF ISOLATING THIS ORGANISM FROM A SINGLE SET OF BLOOD CULTURES WHEN MULTIPLE SETS ARE DRAWN IS UNCERTAIN. PLEASE NOTIFY THE MICROBIOLOGY DEPARTMENT WITHIN ONE WEEK IF SPECIATION AND  SENSITIVITIES ARE REQUIRED. Performed at [facility], [address] [place], [place] [address]    Report Status 06/25/2020 FINAL  Final  C Difficile Quick Screen w PCR reflex     Status: Abnormal   Collection Time: 06/20/20  1:31 PM   Specimen: Stool  Result Value Ref Range Status   C Diff antigen POSITIVE (A) NEGATIVE Final   C Diff toxin POSITIVE (A) NEGATIVE Final   C Diff interpretation Toxin producing C. difficile detected.  Final    Comment: CRITICAL RESULT CALLED TO, READ BACK BY AND VERIFIED WITH: ZULETTA,K RN @1524  ON 06/20/20 JACKSON,K Performed at [facility], [address], [place], [place]      Labs: Basic Metabolic Panel: Recent Labs  Lab 06/20/20 1317 06/20/20 1919 06/21/20 0557 06/22/20 0317 06/24/20 0328 06/25/20 0306  NA 133*  --  134* 137 140 139  K 3.4*  --  3.4* 3.5 3.7 3.8  CL 101  --  101 105 107 107  CO2 22  --  24 25 27 26   GLUCOSE 98  --  92 96 101* 103*  BUN 14  --  12 12 7* <5*  CREATININE 0.73  --  0.67 0.74 0.66 0.64  CALCIUM 7.9*  --  8.2* 8.0* 8.1* 8.1*  MG  --  1.8  --   --   --   --    Liver Function Tests: Recent Labs  Lab 06/20/20 1317 06/21/20 0557  AST 17 17  ALT 9 10  ALKPHOS 87 78  BILITOT 0.8 0.9  PROT 5.9* 5.6*  ALBUMIN 3.3* 3.0*   No results for input(s): LIPASE, AMYLASE in the last 168 hours. No results for input(s): AMMONIA in the last 168 hours. CBC: Recent Labs  Lab 06/20/20 1317 06/21/20 0557 06/22/20 0317  WBC 7.8 6.5 5.1  NEUTROABS 6.3  --   --   HGB 11.4* 10.8* 9.7*  HCT 40.2 33.1* 30.9*  MCV 109.2* 95.9 99.0  PLT 101* 108* 104*       Signed:  08/21/20 MD.  [facility] 06/25/2020, 12:34 PM

## 2020-06-25 NOTE — Progress Notes (Signed)
Occupational Therapy Treatment Patient Details Name: Kristin Rush MRN: [id number] DOB: 1941-06-27 Today's Date: 06/25/2020    History of present illness 79 y.o. female admitted with lethargy, incontinence, poor appetite. Dx of c diff, dehydration, metabolic encephalopathy. 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   OT comments  Patient continues to exhibit poor balance and posterior lean but able to ambulate to bathroom with mod assist and RW. Patient exhibited improved ability to follow one step commands. Mild reports of dizziness with ambulation but not symptomatic.   Follow Up Recommendations  Home health OT    Equipment Recommendations  None recommended by OT    Recommendations for Other Services      Precautions / Restrictions Precautions Precautions: Fall;Posterior Hip Precaution Comments: L inattention secondary to recent CVA then had a Posterior THR and now this admission Restrictions Weight Bearing Restrictions: No       Mobility Bed Mobility Overal bed mobility: Needs Assistance Bed Mobility: Supine to Sit   Sidelying to sit: Min assist;HOB elevated       General bed mobility comments: min assist to transfer to side of bed with hand hold to assist with scooting forward.  Transfers   Equipment used: Agricultural consultant (2 wheeled) Transfers: Sit to/from [employer] to Stand: Min assist;From elevated surface Stand pivot transfers: Mod assist       General transfer comment: min assist to stand from bed with RW. Mod assist for ambulation due to needing assistance to maintain balance and therapist also managing walker. Patient's balance improves when therapist advances walker.    Balance Overall balance assessment: Needs assistance Sitting-balance support: No upper extremity supported;Feet supported Sitting balance-Leahy Scale: Fair Sitting balance - Comments:  improved balance at edge of bed, min guard for safety Postural control: Posterior lean Standing balance support: Bilateral upper extremity supported Standing balance-Leahy Scale: Poor Standing balance comment: BUE support needed on RW, posterior lean                           ADL either performed or assessed with clinical judgement   ADL       Grooming: Set up;Cueing for sequencing;Wash/dry face;Oral care Grooming Details (indicate cue type and reason): required verbal cues for sequencing and improving quality of task for oral care and hand washing while in bed.                 Toilet Transfer: Moderate assistance;Ambulation;RW;Regular Toilet;Grab bars Toilet Transfer Details (indicate cue type and reason): Ambulated to bathroom with RW - theapist providing min assist to reduce posterior lean and manipulating walker to maintain steady pace. verbal cues for hand placement on grab bar. Toileting- Clothing Manipulation and Hygiene: Total assistance Toileting - Clothing Manipulation Details (indicate cue type and reason): total assist for toileting and pericare due to patient needing to hold onto walker and grab bar due to poor balance.             Vision Patient Visual Report: No change from baseline     Perception     Praxis      Cognition Arousal/Alertness: Awake/alert Behavior During Therapy: Flat affect Overall Cognitive Status: History of cognitive impairments - at baseline Area of Impairment: Orientation;Attention;Memory;Safety/judgement;Awareness;Problem solving                 Orientation Level: Disoriented to;Place;Time;Situation Current Attention Level: Sustained Memory: Decreased short-term memory;Decreased recall of precautions Following  Commands: Follows one step commands consistently Safety/Judgement: Decreased awareness of safety;Decreased awareness of deficits Awareness: Emergent Problem Solving: Slow processing;Decreased  initiation;Difficulty sequencing;Requires verbal cues General Comments: IMproved ability to follow commands today.        Exercises     Shoulder Instructions       General Comments      Pertinent Vitals/ Pain       Pain Assessment: No/denies pain  Home Living                                          Prior Functioning/Environment              Frequency  Min 2X/week        Progress Toward Goals  OT Goals(current goals can now be found in the care plan section)  Progress towards OT goals: Progressing toward goals  Acute Rehab OT Goals Patient Stated Goal: return to walking Time For Goal Achievement: 07/07/20 Potential to Achieve Goals: Good  Plan Discharge plan remains appropriate    Co-evaluation          OT goals addressed during session: ADL's and self-care (functional mobility)      AM-PAC OT "6 Clicks" Daily Activity     Outcome Measure   Help from another person eating meals?: A Little Help from another person taking care of personal grooming?: A Little Help from another person toileting, which includes using toliet, bedpan, or urinal?: Total Help from another person bathing (including washing, rinsing, drying)?: A Lot Help from another person to put on and taking off regular upper body clothing?: A Lot Help from another person to put on and taking off regular lower body clothing?: Total 6 Click Score: 12    End of Session Equipment Utilized During Treatment: Gait belt;Rolling walker  OT Visit Diagnosis: Unsteadiness on feet (R26.81);Other abnormalities of gait and mobility (R26.89);Muscle weakness (generalized) (M62.81);Other symptoms and signs involving cognitive function   Activity Tolerance Patient tolerated treatment well   Patient Left in chair;with call bell/phone within reach;with family/visitor present   Nurse Communication  (okay to see per RN)        Time: 0865-7846 OT Time Calculation (min): 26  min  Charges: OT General Charges $OT Visit: 1 Visit OT Treatments $Self Care/Home Management : 23-37 mins  Tirzah Fross, OTR/L Acute Care Rehab Services  Office [phone] Pager: [phone]    Kelli Churn 06/25/2020, 11:21 AM

## 2020-06-29 ENCOUNTER — Telehealth (INDEPENDENT_AMBULATORY_CARE_PROVIDER_SITE_OTHER): Payer: Medicare Other | Admitting: Internal Medicine

## 2020-06-29 VITALS — BP 147/75 | HR 82 | Ht 64.0 in | Wt 130.0 lb

## 2020-06-29 DIAGNOSIS — R55 Syncope and collapse: Secondary | ICD-10-CM

## 2020-06-29 DIAGNOSIS — I951 Orthostatic hypotension: Secondary | ICD-10-CM

## 2020-06-29 NOTE — Progress Notes (Signed)
Virtual Visit via Telephone Note   This visit type was conducted due to national recommendations for restrictions regarding the COVID-19 Pandemic (e.g. social distancing) in an effort to limit this patient's exposure and mitigate transmission in our community.  Due to her co-morbid illnesses, this patient is at least at moderate risk for complications without adequate follow up.  This format is felt to be most appropriate for this patient at this time.  The patient did not have access to video technology/had technical difficulties with video requiring transitioning to audio format only (telephone).  All issues noted in this document were discussed and addressed.  No physical exam could be performed with this format.  Please refer to the patient's chart for her  consent to telehealth for [facility].    Date:  06/29/2020   ID:  Kristin Rush, Kristin Rush [birth year], MRN [id number] The patient was identified using 2 identifiers.  Patient Location: Home Provider Location: Home Office  PCP:  Eartha Inch, MD  Cardiologist:  Parke Poisson, MD  Electrophysiologist:  None   Evaluation Performed:  Follow-Up Visit  Chief Complaint:  Follow up  History of Present Illness:    Kristin Rush is a 79 y.o. female with history of hypertension, recent stroke, limited mobility, and possible prior diagnosis of hydrocephalus contributing to gait impairment and labile blood pressure.  Her sister takes the call today and provides the history.   Kristin Rush has not been doing well, and has had further decrease in cognition. Recently in hospital for lethargy and hypersomnolence. Also dx with C. Diff and treated.   Discussed in detail today that we have reviewed cardiovascular contributors, however I remain concerned that the primary etiology is neurologic.   The patient does not have symptoms concerning for COVID-19 infection (fever, chills, cough, or new shortness of breath).    Past Medical History:   Diagnosis Date  . 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 CHF (congestive heart failure) (HCC)   . Chronic brain-hydrocephalus syndrome (HCC)   . CVA (cerebral vascular accident) (HCC) 04/2020  . 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 Lumbar spondylosis 02/18/2018  . Migraine   . Migraines 10/09/2011  . Orthostatic hypotension 10/26/16   has passed out when up  . Osteoporosis 04/16/2014  . Primary osteoarthritis of both hands 07/11/2017   Past Surgical History:  Procedure Laterality Date  . HIP ARTHROPLASTY Left 05/11/2020   Procedure: ARTHROPLASTY BIPOLAR HIP (HEMIARTHROPLASTY);  Surgeon: Teryl Lucy, MD;  Location: [facility];  Service: Orthopedics;  Laterality: Left;     Current Meds  Medication Sig  . acetaminophen (TYLENOL) 500 MG tablet Take 500 mg by mouth daily as needed for mild pain.   Marland Kitchen ALPRAZolam (XANAX) 0.25 MG tablet Take 1 tablet (0.25 mg total) by mouth 2 (two) times daily as needed for anxiety.  Marland Kitchen aspirin EC 81 MG EC tablet Take 1 tablet (81 mg total) by mouth daily. Swallow whole.  Marland Kitchen atorvastatin (LIPITOR) 80 MG tablet Take 1 tablet (80 mg total) by mouth daily at 6 PM.  . escitalopram (LEXAPRO) 10 MG tablet Take 10 mg by mouth daily.  . feeding supplement, ENSURE ENLIVE, (ENSURE ENLIVE) LIQD Take 237 mLs by mouth 2 (two) times daily between meals.  . ferrous sulfate 325 (65 FE) MG tablet Take 1 tablet (325 mg total) by mouth daily with breakfast.  . vancomycin (VANCOCIN) 125 MG capsule Take 1 capsule (125 mg total) by mouth 4 (four) times daily for 5 days.  Allergies:   Codeine and Sulfa antibiotics   Social History   Tobacco Use  . Smoking status: Never Smoker  . Smokeless tobacco: Never Used  Substance Use Topics  . Alcohol use: No  . Drug use: No     Family Hx: The patient's family history is negative for Heart disease.  ROS:   Please see the history of present illness.     All other systems reviewed and are negative.   Prior CV  studies:   The following studies were reviewed today:    Labs/Other Tests and Data Reviewed:    EKG:  No ECG reviewed.  Recent Labs: 03/17/2020: TSH 2.896 06/20/2020: Magnesium 1.8 06/21/2020: ALT 10 06/22/2020: Hemoglobin 9.7; Platelets 104 06/25/2020: BUN <5; Creatinine, Ser 0.64; Potassium 3.8; Sodium 139   Recent Lipid Panel Lab Results  Component Value Date/Time   CHOL 261 (H) 04/29/2020 08:17 AM   TRIG 62 04/29/2020 08:17 AM   HDL 66 04/29/2020 08:17 AM   CHOLHDL 4.0 04/29/2020 08:17 AM   LDLCALC 183 (H) 04/29/2020 08:17 AM    Wt Readings from Last 3 Encounters:  06/29/20 130 lb (59 kg)  06/20/20 130 lb (59 kg)  05/11/20 130 lb 1.1 oz (59 kg)     Risk Assessment/Calculations:     Objective:    Vital Signs:  BP (!) 147/75   Pulse 82   Ht 5\' 4"  (1.626 m)   Wt 130 lb (59 kg)   BMI 22.31 kg/m     Unable to assess PE due to patient level of cognition by phone.   ASSESSMENT & PLAN:    1. Syncope and collapse   2. Orthostatic hypotension    Discussed in detail with sister. They are considering palliative care, which would be an important team to involve for symptom management. Continue to recommend compression garments and adequate hydration if able for conservative meausures in setting of orthostatic hypotension, which seems most likely primary neurologic.   Cardiology is available as needed, follow up to be arranged by family as needed.   COVID-19 Education: The signs and symptoms of COVID-19 were discussed with the patient and how to seek care for testing (follow up with PCP or arrange E-visit).  The importance of social distancing was discussed today.  Time:   Today, I have spent 20 minutes with the patient with telehealth technology discussing the above problems.     Medication Adjustments/Labs and Tests Ordered: Current medicines are reviewed at length with the patient today.  Concerns regarding medicines are outlined above.   Patient Instructions   Medication Instructions:  No Changes In Medications at this time.  *If you need a refill on your cardiac medications before your next appointment, please call your pharmacy*  Lab Work: None Ordered At This Time.  If you have labs (blood work) drawn today and your tests are completely normal, you will receive your results only by: MyChart Message (if you have MyChart) OR . A paper copy in the mail If you have any lab test that is abnormal or we need to change your treatment, we will call you to review the results.  Testing/Procedures: None Ordered At This Time.   Follow-Up: At [facility], you and your health needs are our priority.  As part of our continuing mission to provide you with exceptional heart care, we have created designated Provider Care Teams.  These Care Teams include your primary Cardiologist (physician) and Advanced Practice Providers (APPs -  Physician Assistants and  Nurse Practitioners) who all work together to provide you with the care you need, when you need it.  Your next appointment:   AS NEEDED   The format for your next appointment:   In Person  Provider:   Weston Brass, MD     Signed, Parke Poisson, MD  06/29/2020 9:14 AM     [facility]

## 2020-06-29 NOTE — Patient Instructions (Signed)
Medication Instructions:  No Changes In Medications at this time.  *If you need a refill on your cardiac medications before your next appointment, please call your pharmacy*  Lab Work: None Ordered At This Time.  If you have labs (blood work) drawn today and your tests are completely normal, you will receive your results only by: . MyChart Message (if you have MyChart) OR . A paper copy in the mail If you have any lab test that is abnormal or we need to change your treatment, we will call you to review the results.  Testing/Procedures: None Ordered At This Time.   Follow-Up: At [facility], you and your health needs are our priority.  As part of our continuing mission to provide you with exceptional heart care, we have created designated Provider Care Teams.  These Care Teams include your primary Cardiologist (physician) and Advanced Practice Providers (APPs -  Physician Assistants and Nurse Practitioners) who all work together to provide you with the care you need, when you need it.  Your next appointment:   AS NEEDED   The format for your next appointment:   In Person  Provider:   Gayatri Acharya, MD 

## 2020-07-12 ENCOUNTER — Emergency Department (HOSPITAL_COMMUNITY): Payer: Medicare Other

## 2020-07-12 ENCOUNTER — Encounter (HOSPITAL_COMMUNITY): Payer: Self-pay

## 2020-07-12 ENCOUNTER — Other Ambulatory Visit: Payer: Self-pay

## 2020-07-12 ENCOUNTER — Emergency Department (HOSPITAL_COMMUNITY)
Admission: EM | Admit: 2020-07-12 | Discharge: 2020-07-12 | Disposition: A | Discharge status: Home / Self Care | Payer: Medicare Other | Attending: Emergency Medicine | Admitting: Emergency Medicine

## 2020-07-12 DIAGNOSIS — Z7982 Long term (current) use of aspirin: Secondary | ICD-10-CM | POA: Insufficient documentation

## 2020-07-12 DIAGNOSIS — R519 Headache, unspecified: Secondary | ICD-10-CM | POA: Diagnosis not present

## 2020-07-12 DIAGNOSIS — I110 Hypertensive heart disease with heart failure: Secondary | ICD-10-CM | POA: Insufficient documentation

## 2020-07-12 DIAGNOSIS — F0390 Unspecified dementia without behavioral disturbance: Secondary | ICD-10-CM | POA: Insufficient documentation

## 2020-07-12 DIAGNOSIS — Z79899 Other long term (current) drug therapy: Secondary | ICD-10-CM | POA: Diagnosis not present

## 2020-07-12 DIAGNOSIS — Z96643 Presence of artificial hip joint, bilateral: Secondary | ICD-10-CM | POA: Insufficient documentation

## 2020-07-12 DIAGNOSIS — I5032 Chronic diastolic (congestive) heart failure: Secondary | ICD-10-CM | POA: Insufficient documentation

## 2020-07-12 DIAGNOSIS — R55 Syncope and collapse: Secondary | ICD-10-CM

## 2020-07-12 DIAGNOSIS — I951 Orthostatic hypotension: Secondary | ICD-10-CM

## 2020-07-12 LAB — CBC WITH DIFFERENTIAL/PLATELET
Abs Immature Granulocytes: 0.01 10*3/uL (ref 0.00–0.07)
Basophils Absolute: 0 10*3/uL (ref 0.0–0.1)
Basophils Relative: 1 %
Eosinophils Absolute: 0 10*3/uL (ref 0.0–0.5)
Eosinophils Relative: 1 %
HCT: 40.5 % (ref 36.0–46.0)
Hemoglobin: 12.5 g/dL (ref 12.0–15.0)
Immature Granulocytes: 0 %
Lymphocytes Relative: 28 %
Lymphs Abs: 1 10*3/uL (ref 0.7–4.0)
MCH: 29.2 pg (ref 26.0–34.0)
MCHC: 30.9 g/dL (ref 30.0–36.0)
MCV: 94.6 fL (ref 80.0–100.0)
Monocytes Absolute: 0.3 10*3/uL (ref 0.1–1.0)
Monocytes Relative: 8 %
Neutro Abs: 2.1 10*3/uL (ref 1.7–7.7)
Neutrophils Relative %: 62 %
Platelets: 150 10*3/uL (ref 150–400)
RBC: 4.28 MIL/uL (ref 3.87–5.11)
RDW: 12.9 % (ref 11.5–15.5)
WBC: 3.4 10*3/uL — ABNORMAL LOW (ref 4.0–10.5)
nRBC: 0 % (ref 0.0–0.2)

## 2020-07-12 LAB — TROPONIN I (HIGH SENSITIVITY)
Troponin I (High Sensitivity): 10 ng/L (ref <18)
Troponin I (High Sensitivity): 11 ng/L (ref <18)

## 2020-07-12 LAB — BASIC METABOLIC PANEL
Anion gap: 10 (ref 5–15)
BUN: 11 mg/dL (ref 8–23)
CO2: 26 mmol/L (ref 22–32)
Calcium: 9.1 mg/dL (ref 8.9–10.3)
Chloride: 102 mmol/L (ref 98–111)
Creatinine, Ser: 0.82 mg/dL (ref 0.44–1.00)
GFR, Estimated: >60 mL/min (ref >60)
Glucose, Bld: 97 mg/dL (ref 70–99)
Potassium: 4 mmol/L (ref 3.5–5.1)
Sodium: 138 mmol/L (ref 135–145)

## 2020-07-12 LAB — LACTIC ACID, PLASMA: Lactic Acid, Venous: 1 mmol/L (ref 0.5–1.9)

## 2020-07-12 LAB — MAGNESIUM: Magnesium: 2 mg/dL (ref 1.7–2.4)

## 2020-07-12 NOTE — ED Notes (Signed)
Pt sister verbalizes understanding of discharge instructions. Opportunity for questioning and answers were provided. Pt discharged from ED via wheelchair.

## 2020-07-12 NOTE — ED Provider Notes (Signed)
79 yo female history of dementia presents today with episode of tonic activity and lips and feet turned blue for 10 minutes Physical Exam  BP (!) 184/84 (BP Location: Right Arm) Comment: RN Ariel notified  Pulse 73   Temp 97.9 F (36.6 C) (Oral)   Resp 18   SpO2 99%   Physical Exam  ED Course/Procedures     Procedures  MDM  DNR updated 10/14 Labs pending ekg  6:02 PM Labs, ct, and cxr reviewed.  Dr. Madaline Guthrie discussed with sister and feels this is similar to prior episodes. Patient appears stable for discharge      Margarita Grizzle, MD 07/12/20 [phone]

## 2020-07-12 NOTE — ED Provider Notes (Signed)
[facility]   CSN: [id number] Arrival date & time: 07/12/20  1331     History Chief Complaint  Patient presents with  . Near Syncope    Kristin Rush is a 79 y.o. female.  The history is provided by the patient and a relative.  Illness Location:  Episode of stiffness and unresponsiveness, lasting 10 minutes, resolved Severity:  Moderate Onset quality:  Sudden Duration:  10 minutes Timing:  Unable to specify Progression:  Resolved Chronicity:  New Context:  After finishing bath, headache earlier in the day, otherwise in baseline health Associated symptoms: no abdominal pain, no chest pain, no cough, no diarrhea, no fever, no headaches and no shortness of breath    Sister reports pt had a headache this morning. Later after her bath, pt lips and feet turned blue, they were concerned for stroke. They report 10 minutes of pt getting really stiff, and being blue in the face.        Past Medical History:  Diagnosis Date  . 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 CHF (congestive heart failure) (HCC)   . Chronic brain-hydrocephalus syndrome (HCC)   . CVA (cerebral vascular accident) (HCC) 04/2020  . 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 Lumbar spondylosis 02/18/2018  . Migraine   . Migraines 10/09/2011  . Orthostatic hypotension 10/23/2016   has passed out when up  . Osteoporosis 04/16/2014  . Primary osteoarthritis of both hands 07/11/2017    Patient Active Problem List   Diagnosis Date Noted  . C. difficile diarrhea 06/21/2020  . Clostridium difficile diarrhea 06/20/2020  . Fracture of femoral neck, left (HCC) 05/11/2020  . Fall at home, initial encounter 05/11/2020  . Chronic diastolic CHF (congestive heart failure) (HCC) 05/11/2020  . 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 Hip fracture (HCC) 05/11/2020  . CVA (cerebral vascular accident) (HCC)  04/29/2020  . Acute CVA (cerebrovascular accident) (HCC) 04/28/2020  . 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 Frequent falls 03/17/2020  . Postural dizziness with presyncope 03/17/2020  . Essential hypertension 03/17/2020  . Right ear pain 03/17/2020  . Excessive cerumen in both ear canals 03/17/2020  . Syncope and collapse 03/17/2020  . 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 Lumbar spondylosis 02/18/2018  . Primary osteoarthritis of both hands 07/11/2017  . Orthostatic hypotension 10/23/2016  . Osteoporosis 04/16/2014  . Migraines 10/09/2011    Past Surgical History:  Procedure Laterality Date  . HIP ARTHROPLASTY Left 05/11/2020   Procedure: ARTHROPLASTY BIPOLAR HIP (HEMIARTHROPLASTY);  Surgeon: Teryl LucyLandau, Joshua, MD;  Location: [facility];  Service: Orthopedics;  Laterality: Left;     OB History   No obstetric history on file.     Family History  Problem Relation Age of Onset  . Heart disease Neg Hx     Social History   Tobacco Use  . Smoking status: Never Smoker  . Smokeless tobacco: Never Used  Substance Use Topics  . Alcohol use: No  . Drug use: No    Home Medications Prior to Admission medications   Medication Sig Start Date End Date Taking? Authorizing Provider  acetaminophen (TYLENOL) 500 MG tablet Take 500 mg by mouth daily as needed for mild pain.     [provider]  ALPRAZolam Prudy Feeler(XANAX) 0.25 MG tablet Take 1 tablet (0.25 mg total) by mouth 2 (two) times daily as needed for anxiety. 05/05/20   Joseph ArtVann, Jessica U, DO  aspirin EC 81  MG EC tablet Take 1 tablet (81 mg total) by mouth daily. Swallow whole. 05/06/20   Joseph Art, DO  atorvastatin (LIPITOR) 80 MG tablet Take 1 tablet (80 mg total) by mouth daily at 6 PM. 05/05/20   Marlin Canary U, DO  escitalopram (LEXAPRO) 10 MG tablet Take 10 mg by mouth daily.    [provider]  feeding supplement, ENSURE ENLIVE, (ENSURE ENLIVE) LIQD Take 237 mLs by mouth 2 (two) times daily between  meals. 05/14/20   Albertine Grates, MD  ferrous sulfate 325 (65 FE) MG tablet Take 1 tablet (325 mg total) by mouth daily with breakfast. 05/14/20   Albertine Grates, MD  Maltodextrin-Xanthan Gum (RESOURCE [facility] CLEAR) POWD To make liquids honey thick Patient not taking: Reported on 06/29/2020 05/05/20   Joseph Art, DO  nystatin (MYCOSTATIN) 100000 UNIT/ML suspension Take 5 mLs (500,000 Units total) by mouth 4 (four) times daily. Patient not taking: Reported on 06/20/2020 05/14/20   Albertine Grates, MD    Allergies    Codeine and Sulfa antibiotics  Review of Systems   Review of Systems  Unable to perform ROS: Dementia  Constitutional: Negative for chills and fever.  HENT: Negative for trouble swallowing.   Eyes: Negative for visual disturbance.  Respiratory: Negative for cough and shortness of breath.   Cardiovascular: Negative for chest pain.  Gastrointestinal: Negative for abdominal pain and diarrhea.  Musculoskeletal: Negative for neck pain.  Neurological: Negative for headaches.    Physical Exam Updated Vital Signs BP (!) 202/90 (BP Location: Right Arm)   Pulse 75   Temp 98.1 F (36.7 C) (Oral)   Resp 13   SpO2 100%   Physical Exam Vitals reviewed.  Constitutional:      General: She is not in acute distress.    Appearance: Normal appearance.  HENT:     Head: Normocephalic and atraumatic.     Nose: Nose normal.     Mouth/Throat:     Mouth: Mucous membranes are moist.     Pharynx: Oropharynx is clear.  Eyes:     Conjunctiva/sclera: Conjunctivae normal.  Cardiovascular:     Heart sounds: Normal heart sounds.  Pulmonary:     Effort: Pulmonary effort is normal.     Breath sounds: Normal breath sounds.  Abdominal:     General: Abdomen is flat.     Palpations: Abdomen is soft.     Tenderness: There is no abdominal tenderness.  Musculoskeletal:        General: No swelling or deformity.     Cervical back: Neck supple.     Right lower leg: No edema.     Left lower leg: No edema.    Skin:    General: Skin is warm and dry.  Neurological:     Mental Status: She is alert.     Cranial Nerves: No cranial nerve deficit.     Motor: No weakness.     ED Results / Procedures / Treatments   Labs (all labs ordered are listed, but only abnormal results are displayed) Labs Reviewed  CBC WITH DIFFERENTIAL/PLATELET - Abnormal; Notable for the following components:      Result Value   WBC 3.4 (*)    All other components within normal limits  BASIC METABOLIC PANEL  LACTIC ACID, PLASMA  MAGNESIUM  CBG MONITORING, ED  TROPONIN I (HIGH SENSITIVITY)  TROPONIN I (HIGH SENSITIVITY)    EKG EKG Interpretation  Date/Time:  Monday July 12 2020 13:38:31 EDT Ventricular Rate:  71 PR Interval:    QRS Duration: 94 QT Interval:  389 QTC Calculation: 423 R Axis:   68 Text Interpretation: Sinus rhythm M Reconfirmed by Blane Ohara [phone]) on 07/12/2020 4:26:22 PM   Radiology CT Head Wo Contrast  Result Date: 07/12/2020 CLINICAL DATA:  Near syncope, history stroke, CHF, chronic brain hydrocephalus syndrome, hypertension EXAM: CT HEAD WITHOUT CONTRAST TECHNIQUE: Contiguous axial images were obtained from the base of the skull through the vertex without intravenous contrast. Sagittal and coronal MPR images reconstructed from axial data set. COMPARISON:  06/20/2020 FINDINGS: Brain: Generalized atrophy. Ex vacuo dilatation of the ventricular system similar to previous exam. No midline shift or mass effect. Small vessel chronic ischemic changes of deep cerebral white matter. Old lacunar infarcts BILATERAL basal ganglia and RIGHT thalamus. No intracranial hemorrhage, extra-axial fluid collections, mass, or additional infarcts. Vascular: Minimal atherosclerotic calcification of internal carotid arteries at skull base. No hyperdense vessels. Skull: Intact Sinuses/Orbits: Clear Other: N/A IMPRESSION: Atrophy with small vessel chronic ischemic changes of deep cerebral white matter. Old  lacunar infarcts at BILATERAL basal ganglia and RIGHT thalamus. No acute intracranial abnormalities. Electronically Signed   By: Ulyses Southward M.D.   On: 07/12/2020 15:31   DG Chest Portable 1 View  Result Date: 07/12/2020 CLINICAL DATA:  Syncope. EXAM: PORTABLE CHEST 1 VIEW COMPARISON:  Prior chest radiographs 06/20/2020. FINDINGS: Heart size within normal limits. Aortic atherosclerosis. No appreciable airspace consolidation or pulmonary edema. No evidence of pleural effusion or pneumothorax. No acute bony abnormality identified. IMPRESSION: No evidence of acute cardiopulmonary abnormality. Aortic Atherosclerosis (ICD10-I70.0). Electronically Signed   By: Jackey Loge DO   On: 07/12/2020 15:07    Procedures Procedures (including critical care time)  Medications Ordered in ED Medications - No data to display  ED Course  I have reviewed the triage vital signs and the nursing notes.  Pertinent labs & imaging results that were available during my care of the patient were reviewed by me and considered in my medical decision making (see chart for details).    MDM Rules/Calculators/A&P                          Medical Decision Making: Nari Vannatter is a 79 y.o. female who presented to the ED today for evaluation of an episode of stiffness and cyanosis.  Past medical history significant for CVA, CHF, Migraine, chronic brain hydrocephalus, orthostatic hypotension, migraines Reviewed and confirmed nursing documentation for past medical history, family history, social history.  On my initial exam, the pt was awake and alert, in NAD, normal WOB, no skin discoloration, pt moving all extremities no facial asymmetry .   Broad workup initiated as multiple possible etiologies of transient unresponsive episode witnessed earlier. CT head, CXR, cbc, bmp, troponin, EKG ordered.    All radiology and laboratory studies reviewed independently and with my attending physician, agree with reading provided by  radiologist unless otherwise noted.   Based on the above findings, I believe patient requires admission.   Pt care handed off to Dr. Madaline Guthrie at 4 pm. Full history and plan has been relayed, please see her note for remainder of pt care and ultimate disposition.  The above care was discussed with and agreed upon by my attending physician.    Emergency Department Medication Summary:  Medications - No data to display     Final Clinical Impression(s) / ED Diagnoses Final diagnoses:  Syncope and collapse    Rx / DC  Orders ED Discharge Orders    None       Brantley Fling, MD 07/12/20 1711    Blane Ohara, MD 07/14/20 1349

## 2020-07-12 NOTE — ED Triage Notes (Signed)
Pt arrived via EMS from home. Per family pt had a syncopal episode in her wheel chair. Family was going to shower the patient and her "eyes rolled back and she became stiff."

## 2020-07-12 NOTE — ED Provider Notes (Addendum)
  Physical Exam  BP (!) 202/90 (BP Location: Right Arm)   Pulse 75   Temp 98.1 F (36.7 C) (Oral)   Resp 13   SpO2 100%   Physical Exam  ED Course/Procedures     Procedures  MDM  Assumed care of patient at 1500 from Dr. Nunzio Cory.  Patient had an episode of unresponsiveness and stiff extremities with cyanotic lips and extremities that lasted about 10 minutes.  It has resolved.  Patient feels well, has no other complaints. EKG ok. Pending labs and CT head.  Low suspicion for stroke with no new neurologic deficits on exam.  CT head shows old strokes but nothing new.  Labs unremarkable.  Patient's blood pressure is noted to be labile.  Orthostatic vitals obtained and remarkable for orthostatic hypotension. consulted hospitalist for admission for syncope versus seizure.  Hospitalist came and evaluated the patient and spoke with the sister at bedside.  Per the sister, the patient has a long 20+ year history of orthostatic hypotension and has syncopized many times.  They have tried many medication such as midodrine and Florinef for the symptoms before.  Hospitalist had a shared decision-making conversation with patient and family member and offered admission for PT and palliative care consultations, but palliative is already working with the family, and they would rather take her home if there is nothing acutely to be done.  I independently spoke with the patient's sister at bedside.  Patient's sister confirms the details of that conversation and affirms that she would rather just take the patient home rather than admit her when there is not much to be done in the inpatient setting.  Patient sister reports that the only reason that they brought her to the emergency room when she has such a long history of orthostatic hypotension and similar syncopal events is that the other sister who witnessed the event had had a concern for a stroke, so that is why they brought her in.  Advised patient and family of  concern for orthostatic hypotension. Advised treatment of symptoms with continuing all home medications as previously prescribed. Recommended follow-up with PCP in the next couple days. Strict return precautions provided. Patient discharged in stable condition.   Care of this patient overseen by Dr. Rosalia Hammers, ED attending, who agreed with evaluation and management.        Loletha Carrow, MD 07/12/20 Albertina Parr, MD 07/12/20 [phone]

## 2020-07-15 ENCOUNTER — Telehealth: Payer: Self-pay | Admitting: Nurse Practitioner

## 2020-07-15 NOTE — Telephone Encounter (Signed)
Called patient's home number to schedule Palliative Consult and spoke with sister, [name] regarding the Palliative referral/services.  All questions were answered and sister was in agreement with scheduling visit.  I have scheduled an In-person Consult for 07/20/20 @ 3 PM.

## 2020-07-20 ENCOUNTER — Other Ambulatory Visit: Payer: Self-pay

## 2020-07-20 ENCOUNTER — Other Ambulatory Visit: Payer: Medicare Other | Admitting: Nurse Practitioner

## 2020-07-20 DIAGNOSIS — Z515 Encounter for palliative care: Secondary | ICD-10-CM

## 2020-07-20 DIAGNOSIS — R296 Repeated falls: Secondary | ICD-10-CM

## 2020-07-20 NOTE — Progress Notes (Signed)
[employer] [facility] Palliative Care Consult Note Telephone: [phone]  Fax: [phone]  PATIENT NAME: Kristin Rush [address] [place] [address] [phone] (home)  DOB: [birth year] MRN: [id number]  PRIMARY CARE PROVIDER:    Chesley Noon, MD,  [place] [address] [phone]  REFERRING PROVIDER:   Chesley Noon, MD [address] [place],  [place] [address] [phone]  RESPONSIBLE PARTY:   Extended Emergency Contact Information Primary Emergency Contact: Kristin Rush Mobile Phone: [phone] Relation: Sister Secondary Emergency Contact: Kristin Rush Address: [address]           [place],  [address] [place] Phone: [phone] Relation: Sister  I met face to face with patient and her four sisters in home. Patient present but unable to substantively engage in process due to poor cognition.   ASSESSMENT AND RECOMMENDATIONS:   1. Advance Care Planning: Today's visit consisted of building trust and discussions on Palliative care medicine as a specialized medical care for people living with serious illness, aimed at facilitating better quality of life through symptoms relief, assisting with advance care planning and establishing goals of care. Family expressed appreciation for education provided on Palliative care and how it differs from Hospice service.  Goal of care: Per family, patient's goal of care is comfort. Family want to be able to care for patient in home safely. Directives: Patient has a living will, her code status is DNR. Family endorsed patient does not want to be resuscitated in the event of cardiac or respiratory arrest. MOST Rush discussed, sections of the MOST Rush discussed and reviewed in detail, ample opportunity given to family for questions, all questions answered. DNR and MOST Rush signed and given to family to keep in home, copy uploaded to [facility] EMR. MOST details include; comfort measures, determine use and limitation of antibiotics when infection occurs, IV fluids for a defined trial period, no feeding tube. Palliative care will continue to provide support to patient, family and the medical team.  2. Symptom Management: Patient with Dementia (FAST 6e), chronic normal pressure hydrocephalus deemed not good candidate for VP shunt placement by Neurosurgery due to her multiple comorbididties. 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 Family report as much as 34 points drop in SBP sometimes with position change. Patient has had about 6 ED visits in the last 6 months, had a fall that resulted in hip fracture. Patient syncopal episode is usually sudden with no precipitating factors per family report. Patient is followed by cardiology and has been tried on several medications including Midodrine and Florinef. Family report difficulty maintaining blood pressures, she is either having low blood pressures or extremely high blood pressure in 200s. Patient has been told by cardiology that they are unable to help any further. During one of patient's ED visit, EEG was offered to rule out seizure due to report of rigidity and blank stare, family report this was declined as family believed her symptoms is not seizure and opted to take patient home from ED. Sisters verbalized desire for patient to be comfortable, they asked what palliative care can offer patient at this time. We discussed findings and recommendations from Cardiology and Neurosurgery. Family verbalized awareness that Cardiology and Neurosurgery not having any other therapy to offer to alleviate the condition. Family agreed that medications has not relieved patients symptoms in the past. We discuss comfort measures, we discussed  benefit and role of Hospice care in this situation. Family initially hesitated about Hospice care, after discussion  among themselves agreed that the patient/family will benefit from services that Hospice can provide including quick access to a medical personel in acute situation as family not wanting ED visits. Family want to proceed with Hospice care referral. Gave my business card to family to call me if they have any questions.   3. Family /Caregiver/Community Supports: Family report patient is single and has no biological children She lives at home with her sister Kristin Rush. She has 4 other sister, family very involved in her care. He sisters Kristin Rush and Kristin Rush are her HCPOA. Kristin Rush is a Writer is a [employer] manager. Patient has a paid care giver Kristin Rush who provides care for patient 11hrs a day 5 days in a week, her sisters assist the rest of the time.  4. Cognitive / Functional decline: Patient awake and alert, pleasantly confused. Patient requires assistance with all ADLs, including feeding at times. Patient ambulates with wheelchair, one person assist for transfers, unable to wheel self. Family report that on patient's good day she is able to ambulate with a walker but overall she has been declining and not able to walk in several weeks. Patient has had several falls related to syncopal episodes from orthostatic hypotension, last fall with ED visit was on 07/12/2020.  I spent 90 minutes providing this consultation, time includes time spent with patient and family, chart review, provider coordination, and documentation. More than 50% of the time in this consultation was spent coordinating communication.   HISTORY OF PRESENT ILLNESS:  Kristin Rush is a 79 y.o. 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 Patient has had  about 6 ED visit in the last 6 months related to the condition. Patient is followed by by Cardiology and Neurology. Palliative Care was asked to follow this patient by consultation request of Kristin Noon, MD to help address advance care planning and goals of care. Palliative Care was asked to help address goals of care. This is an initial visit.  CODE STATUS: DNR  PPS: 30%  HOSPICE ELIGIBILITY/DIAGNOSIS: Patient deemed eligible for Hospice care by Dr. Antonieta Loveless given her multiple medical conditions not currently on therapy for and overall declining performance status. Family agreed to services, reiterating desire for comfort. Will discuss patient condition and Hospice care appropriateness with her PCP Dr. Melford Aase.  PHYSICAL EXAM / ROS:   Current and past weights: 137lbs, Ht 68f3", BMI 24.22kg/m2, family report weight loss General: chronically ill and frail appearing, confused, sitting in chair in NAD Cardiovascular: no chest pain reported, no edema, S1S2 normal Pulmonary: no cough, no SOB, room air GI: no swallowing issues reported, appetite fair, no report of constipation, incontinent of bowel GU: denies dysuria, incontinent of urine MSK:  no joint and ROM abnormalities, non-ambulatory Skin: no rashes or wounds reported or noted on exposed skin Neurological: weakness  PAST MEDICAL HISTORY:  Past Medical History:  Diagnosis Date   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   CHF (congestive heart failure) (HCC)    Chronic brain-hydrocephalus syndrome (HCC)    CVA (cerebral vascular accident) ([facility] 04/2020   Disc degeneration, 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/2018   Lumbar spondylosis 02/18/2018   Migraine  Migraines 10/09/2011   Orthostatic hypotension Nov 19, 2016   has passed out when up   Osteoporosis 04/16/2014   Primary osteoarthritis of both hands 07/11/2017    SOCIAL HX:  Social History   Tobacco Use   Smoking status: Never Smoker   Smokeless tobacco: Never Used    Substance Use Topics   Alcohol use: No   FAMILY HX:  Family History  Problem Relation Age of Onset   Heart disease Neg Hx    ALLERGIES:  Allergies  Allergen Reactions   Codeine Nausea And Vomiting   Sulfa Antibiotics Other (See Comments)    Reaction not recalled, but is allergic     PERTINENT MEDICATIONS:  Outpatient Encounter Medications as of 07/20/2020  Medication Sig   acetaminophen (TYLENOL) 500 MG tablet Take 500 mg by mouth every 8 (eight) hours as needed for mild pain (or headaches).    ALPRAZolam (XANAX) 0.25 MG tablet Take 1 tablet (0.25 mg total) by mouth 2 (two) times daily as needed for anxiety.   aspirin EC 81 MG EC tablet Take 1 tablet (81 mg total) by mouth daily. Swallow whole.   atorvastatin (LIPITOR) 80 MG tablet Take 1 tablet (80 mg total) by mouth daily at 6 PM.   escitalopram (LEXAPRO) 10 MG tablet Take 10 mg by mouth in the morning.    feeding supplement, ENSURE ENLIVE, (ENSURE ENLIVE) LIQD Take 237 mLs by mouth 2 (two) times daily between meals. (Patient taking differently: Take 237 mLs by mouth daily. )   ferrous sulfate 325 (65 FE) MG tablet Take 1 tablet (325 mg total) by mouth daily with breakfast.   Maltodextrin-Xanthan Gum (RESOURCE THICKENUP CLEAR) POWD To make liquids honey thick (Patient taking differently: Take by mouth See admin instructions. To make liquids honey-thick)   nystatin (MYCOSTATIN) 100000 UNIT/ML suspension Take 5 mLs (500,000 Units total) by mouth 4 (four) times daily. (Patient not taking: Reported on 07/12/2020)   ondansetron (ZOFRAN-ODT) 4 MG disintegrating tablet Take 4 mg by mouth every 8 (eight) hours as needed for nausea or vomiting (DISSOLVE ORALLY).   No facility-administered encounter medications on file as of 07/20/2020.    Jari Favre, DNP, AGPCNP-BC

## 2020-09-11 DEATH — deceased

## 2020-10-15 IMAGING — CT CT ANGIO NECK
3 of 7 series · 10 of 36 positions shown · IV contrast (OMNI 350)
Comparison: CT head 04/28/2020.  MRI head 04/03/2020

CLINICAL DATA: Focal neuro deficit greater than 6 hours. Isolated
facial droop.

EXAM:
CT ANGIOGRAPHY HEAD AND NECK
TECHNIQUE: Multidetector CT imaging of the head and neck was performed using
the standard protocol during bolus administration of intravenous
contrast. Multiplanar CT image reconstructions and MIPs were
obtained to evaluate the vascular anatomy. Carotid stenosis
measurements (when applicable) are obtained utilizing NASCET
criteria, using the distal internal carotid diameter as the
denominator.
CONTRAST:  50mL OMNIPAQUE IOHEXOL 350 MG/ML SOLN

[Series 5: cta neck · axial · 0.37mm/px · z∈[-241,-135]mm · 2 of 159 slices shown]
[im 53/159  soft-tissue]
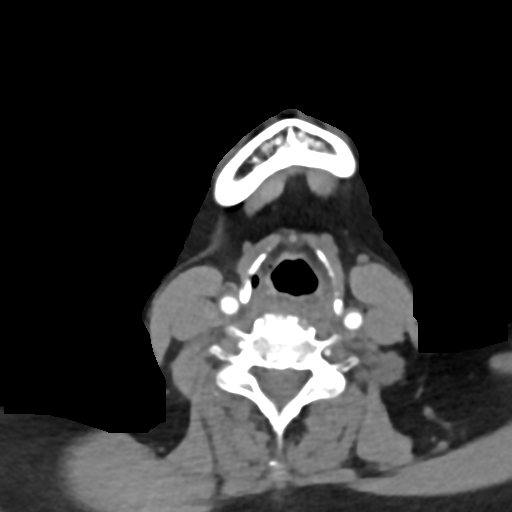
[im 106/159  soft-tissue]
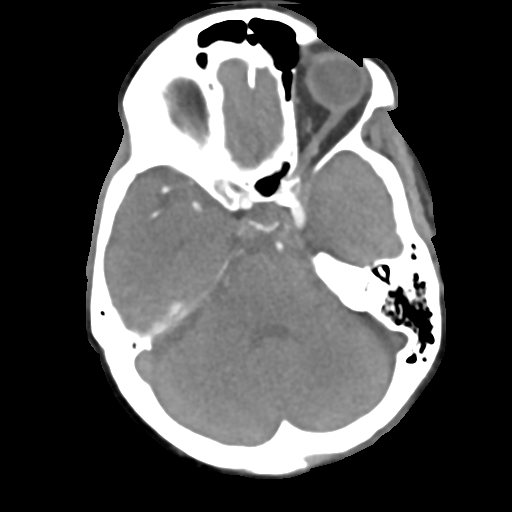

[Series 7: cta neck axial · axial · 0.39mm/px · z∈[-313,-91]mm · 6 of 317 slices shown]
[im 46/317  soft-tissue]
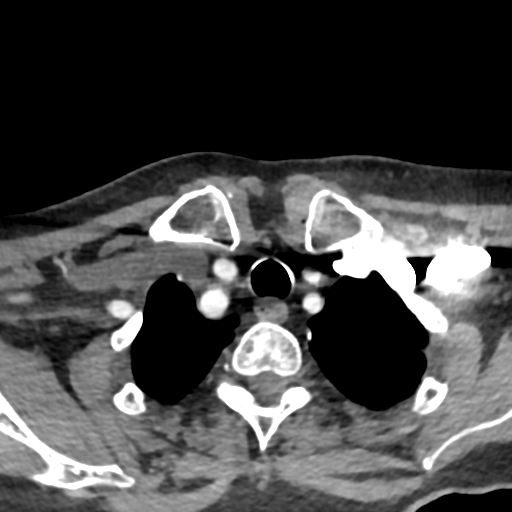
[im 91/317  bone]
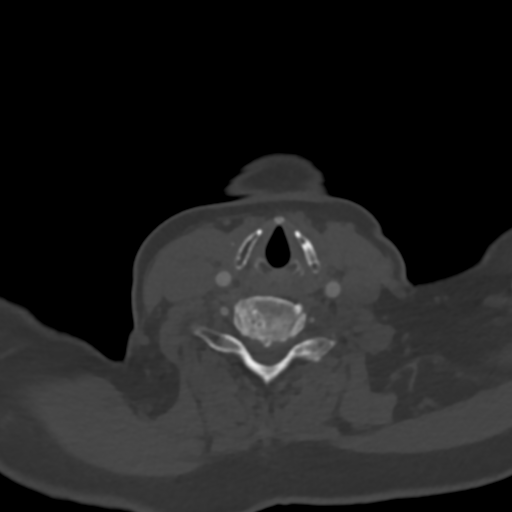
[im 136/317  soft-tissue]
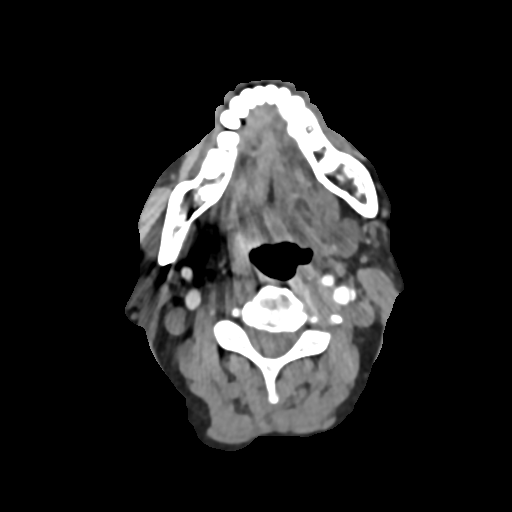
[im 181/317  bone]
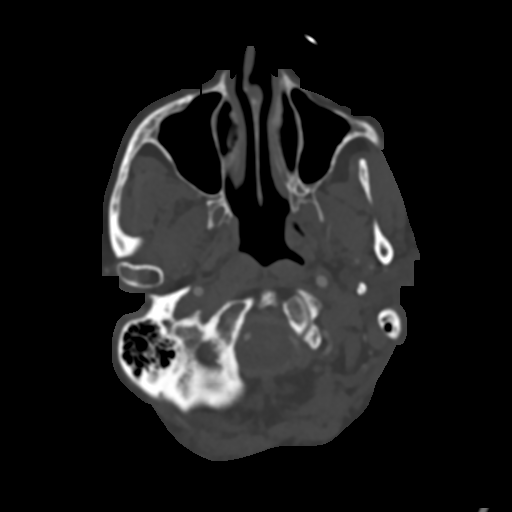
[im 226/317  soft-tissue]
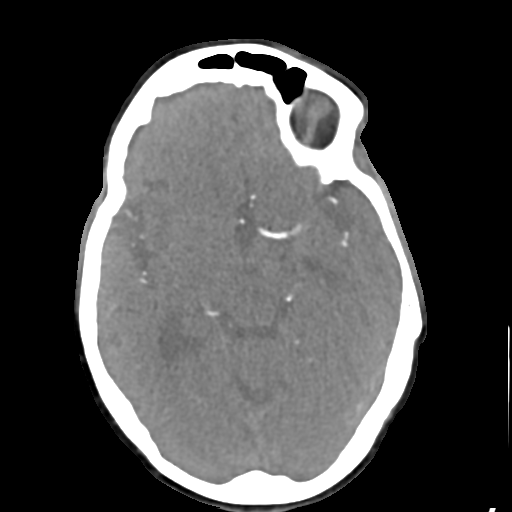
[im 271/317  bone]
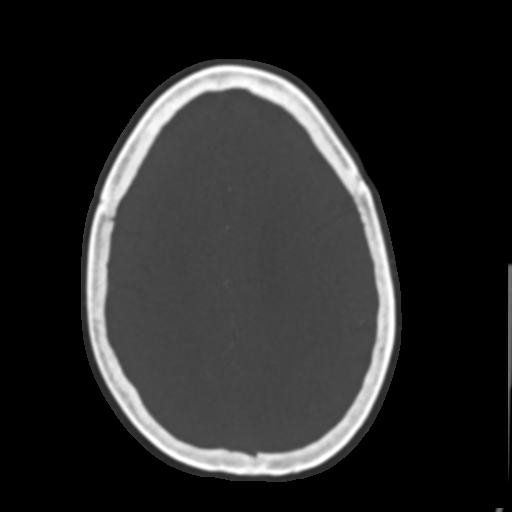

[Series 9: cta neck sagittal · sagittal · 0.41mm/px · 2 of 201 slices shown]
[im 18/201  soft-tissue]
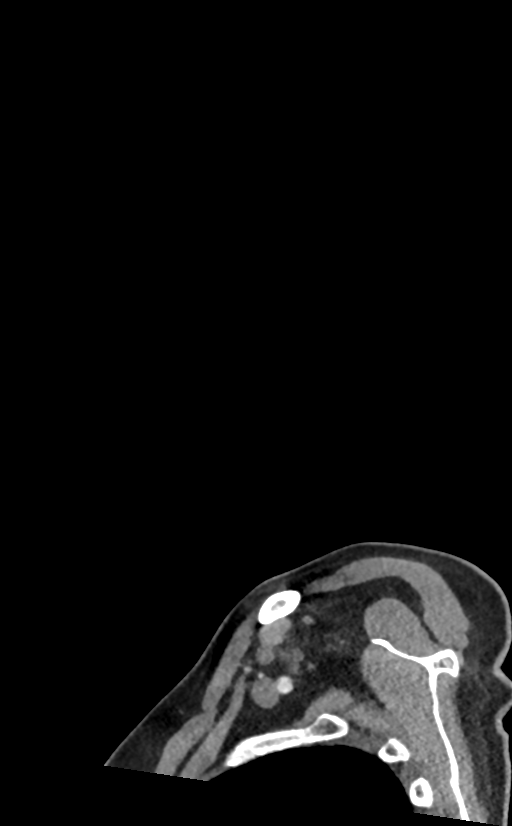
[im 184/201  soft-tissue]
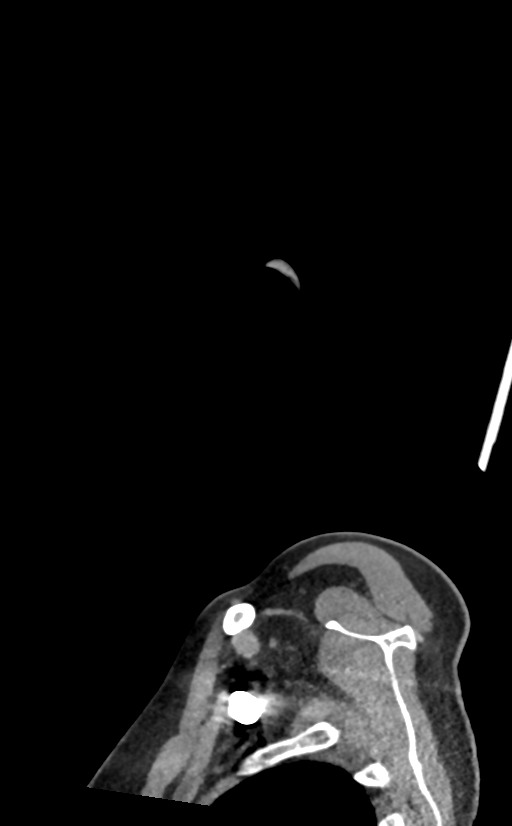

[10 of 36 positions shown; findings below may reference images not displayed]

FINDINGS: CTA NECK FINDINGS

Aortic arch: Mild atherosclerotic calcification aortic arch. Bovine
branching pattern. Proximal great vessels widely patent.

Right carotid system: Mild noncalcified plaque in the right common
carotid artery artery. Atherosclerotic calcification in the right
carotid bulb with approximately 25% diameter stenosis of the right
internal carotid artery. Right external carotid artery widely
patent.

Left carotid system: Left common carotid artery widely patent. Mild
atherosclerotic calcification left carotid bifurcation without
significant stenosis.

Vertebral arteries: Right vertebral artery dominant and patent to
the basilar without stenosis. Small left vertebral artery which is
hypoplastic distal to PICA. Small contribution to the basilar.

Skeleton: Cervical spondylosis.  No acute skeletal abnormality.

Other neck: Negative for mass or adenopathy

Upper chest: Apical pleural scarring bilaterally. Lung apices clear
bilaterally.

Review of the MIP images confirms the above findings

CTA HEAD FINDINGS

Anterior circulation: Atherosclerotic calcification in the cavernous
carotid bilaterally without stenosis. Anterior and middle cerebral
arteries patent bilaterally without large vessel occlusion. Mild
atherosclerotic irregularity in the M3 branches bilaterally

Posterior circulation: Both vertebral arteries contribute to the
basilar with right vertebral artery dominant. PICA patent
bilaterally. Basilar widely patent. Superior cerebellar and
posterior cerebral arteries patent bilaterally. Fetal origin right
posterior cerebral artery.

Venous sinuses: Minimal venous contrast due to arterial phase
scanning

Anatomic variants: None

Review of the MIP images confirms the above findings
IMPRESSION: 1. No significant carotid or vertebral artery stenosis in the neck.
Mild atherosclerotic disease.
2. Negative for intracranial large vessel occlusion
3. Mild atherosclerotic irregularity in the M3 branches bilaterally.
